# Patient Record
Sex: Female | Born: 1965 | Race: White | Hispanic: No | Marital: Married | Smoking: Never smoker
Health system: Southern US, Community
[De-identification: ages and names within clinical notes are randomized; demographics above are authoritative.]

## PROBLEM LIST (undated history)

## (undated) DIAGNOSIS — R011 Cardiac murmur, unspecified: Secondary | ICD-10-CM

## (undated) DIAGNOSIS — F329 Major depressive disorder, single episode, unspecified: Secondary | ICD-10-CM

## (undated) DIAGNOSIS — E119 Type 2 diabetes mellitus without complications: Secondary | ICD-10-CM

## (undated) DIAGNOSIS — N390 Urinary tract infection, site not specified: Secondary | ICD-10-CM

## (undated) DIAGNOSIS — H9313 Tinnitus, bilateral: Secondary | ICD-10-CM

## (undated) DIAGNOSIS — F32A Depression, unspecified: Secondary | ICD-10-CM

## (undated) DIAGNOSIS — R7303 Prediabetes: Secondary | ICD-10-CM

## (undated) DIAGNOSIS — N029 Recurrent and persistent hematuria with unspecified morphologic changes: Secondary | ICD-10-CM

## (undated) DIAGNOSIS — I10 Essential (primary) hypertension: Secondary | ICD-10-CM

## (undated) DIAGNOSIS — R519 Headache, unspecified: Secondary | ICD-10-CM

## (undated) DIAGNOSIS — Z8489 Family history of other specified conditions: Secondary | ICD-10-CM

## (undated) DIAGNOSIS — G4730 Sleep apnea, unspecified: Secondary | ICD-10-CM

## (undated) DIAGNOSIS — T7840XA Allergy, unspecified, initial encounter: Secondary | ICD-10-CM

## (undated) DIAGNOSIS — E669 Obesity, unspecified: Secondary | ICD-10-CM

## (undated) HISTORY — DX: Major depressive disorder, single episode, unspecified: F32.9

## (undated) HISTORY — DX: Allergy, unspecified, initial encounter: T78.40XA

## (undated) HISTORY — PX: CYSTECTOMY: SUR359

## (undated) HISTORY — DX: Obesity, unspecified: E66.9

## (undated) HISTORY — DX: Urinary tract infection, site not specified: N39.0

## (undated) HISTORY — DX: Type 2 diabetes mellitus without complications: E11.9

## (undated) HISTORY — DX: Depression, unspecified: F32.A

## (undated) HISTORY — DX: Cardiac murmur, unspecified: R01.1

## (undated) HISTORY — DX: Essential (primary) hypertension: I10

## (undated) HISTORY — DX: Recurrent and persistent hematuria with unspecified morphologic changes: N02.9

---

## 1999-08-15 ENCOUNTER — Other Ambulatory Visit: Admission: RE | Admit: 1999-08-15 | Discharge: 1999-08-15 | Payer: Self-pay | Admitting: Gynecology

## 1999-09-18 ENCOUNTER — Emergency Department (HOSPITAL_COMMUNITY): Admission: EM | Admit: 1999-09-18 | Discharge: 1999-09-18 | Payer: Self-pay | Admitting: Emergency Medicine

## 1999-10-02 ENCOUNTER — Encounter: Payer: Self-pay | Admitting: Family Medicine

## 1999-10-02 ENCOUNTER — Encounter: Admission: RE | Admit: 1999-10-02 | Discharge: 1999-10-02 | Payer: Self-pay | Admitting: Family Medicine

## 2000-09-03 ENCOUNTER — Other Ambulatory Visit: Admission: RE | Admit: 2000-09-03 | Discharge: 2000-09-03 | Payer: Self-pay | Admitting: Gynecology

## 2001-10-05 ENCOUNTER — Other Ambulatory Visit: Admission: RE | Admit: 2001-10-05 | Discharge: 2001-10-05 | Payer: Self-pay | Admitting: Gynecology

## 2002-01-31 ENCOUNTER — Encounter: Payer: Self-pay | Admitting: Emergency Medicine

## 2002-01-31 ENCOUNTER — Emergency Department (HOSPITAL_COMMUNITY): Admission: EM | Admit: 2002-01-31 | Discharge: 2002-01-31 | Payer: Self-pay | Admitting: Emergency Medicine

## 2004-03-20 ENCOUNTER — Other Ambulatory Visit: Admission: RE | Admit: 2004-03-20 | Discharge: 2004-03-20 | Payer: Self-pay | Admitting: Gynecology

## 2005-11-14 ENCOUNTER — Other Ambulatory Visit: Admission: RE | Admit: 2005-11-14 | Discharge: 2005-11-14 | Payer: Self-pay | Admitting: Gynecology

## 2006-05-19 ENCOUNTER — Emergency Department (HOSPITAL_COMMUNITY): Admission: EM | Admit: 2006-05-19 | Discharge: 2006-05-19 | Payer: Self-pay | Admitting: Emergency Medicine

## 2006-05-20 ENCOUNTER — Ambulatory Visit (HOSPITAL_COMMUNITY): Admission: RE | Admit: 2006-05-20 | Discharge: 2006-05-20 | Payer: Self-pay | Admitting: Ophthalmology

## 2006-08-25 ENCOUNTER — Encounter: Admission: RE | Admit: 2006-08-25 | Discharge: 2006-08-25 | Payer: Self-pay | Admitting: Obstetrics and Gynecology

## 2006-11-02 ENCOUNTER — Emergency Department (HOSPITAL_COMMUNITY): Admission: EM | Admit: 2006-11-02 | Discharge: 2006-11-02 | Payer: Self-pay | Admitting: Family Medicine

## 2006-11-13 ENCOUNTER — Other Ambulatory Visit: Admission: RE | Admit: 2006-11-13 | Discharge: 2006-11-13 | Payer: Self-pay | Admitting: Gynecology

## 2007-02-25 ENCOUNTER — Emergency Department (HOSPITAL_COMMUNITY): Admission: EM | Admit: 2007-02-25 | Discharge: 2007-02-25 | Payer: Self-pay | Admitting: Family Medicine

## 2007-07-01 ENCOUNTER — Emergency Department (HOSPITAL_COMMUNITY): Admission: EM | Admit: 2007-07-01 | Discharge: 2007-07-01 | Payer: Self-pay | Admitting: Emergency Medicine

## 2007-11-18 ENCOUNTER — Other Ambulatory Visit: Admission: RE | Admit: 2007-11-18 | Discharge: 2007-11-18 | Payer: Self-pay | Admitting: Gynecology

## 2008-02-03 ENCOUNTER — Emergency Department (HOSPITAL_COMMUNITY): Admission: EM | Admit: 2008-02-03 | Discharge: 2008-02-03 | Payer: Self-pay | Admitting: Emergency Medicine

## 2008-03-22 ENCOUNTER — Encounter: Payer: Self-pay | Admitting: Internal Medicine

## 2008-03-23 ENCOUNTER — Emergency Department (HOSPITAL_COMMUNITY): Admission: EM | Admit: 2008-03-23 | Discharge: 2008-03-23 | Payer: Self-pay | Admitting: Emergency Medicine

## 2008-04-20 ENCOUNTER — Emergency Department (HOSPITAL_COMMUNITY): Admission: EM | Admit: 2008-04-20 | Discharge: 2008-04-20 | Payer: Self-pay | Admitting: Emergency Medicine

## 2008-09-08 ENCOUNTER — Encounter: Payer: Self-pay | Admitting: Internal Medicine

## 2008-09-11 ENCOUNTER — Telehealth: Payer: Self-pay | Admitting: Internal Medicine

## 2008-09-11 ENCOUNTER — Ambulatory Visit: Payer: Self-pay | Admitting: Internal Medicine

## 2008-09-11 DIAGNOSIS — R1033 Periumbilical pain: Secondary | ICD-10-CM | POA: Insufficient documentation

## 2008-09-13 ENCOUNTER — Ambulatory Visit: Payer: Self-pay | Admitting: Cardiology

## 2008-09-14 ENCOUNTER — Telehealth: Payer: Self-pay | Admitting: Internal Medicine

## 2008-11-24 ENCOUNTER — Other Ambulatory Visit: Admission: RE | Admit: 2008-11-24 | Discharge: 2008-11-24 | Payer: Self-pay | Admitting: Gynecology

## 2008-11-24 ENCOUNTER — Encounter: Payer: Self-pay | Admitting: Women's Health

## 2008-11-24 ENCOUNTER — Ambulatory Visit: Payer: Self-pay | Admitting: Women's Health

## 2009-06-08 ENCOUNTER — Emergency Department (HOSPITAL_COMMUNITY): Admission: EM | Admit: 2009-06-08 | Discharge: 2009-06-08 | Payer: Self-pay | Admitting: Emergency Medicine

## 2009-07-27 ENCOUNTER — Emergency Department (HOSPITAL_COMMUNITY): Admission: EM | Admit: 2009-07-27 | Discharge: 2009-07-27 | Payer: Self-pay | Admitting: Emergency Medicine

## 2009-10-11 ENCOUNTER — Ambulatory Visit (HOSPITAL_BASED_OUTPATIENT_CLINIC_OR_DEPARTMENT_OTHER): Admission: RE | Admit: 2009-10-11 | Discharge: 2009-10-11 | Payer: Self-pay | Admitting: Surgery

## 2009-12-03 ENCOUNTER — Other Ambulatory Visit: Admission: RE | Admit: 2009-12-03 | Discharge: 2009-12-03 | Payer: Self-pay | Admitting: Gynecology

## 2009-12-03 ENCOUNTER — Ambulatory Visit: Payer: Self-pay | Admitting: Women's Health

## 2009-12-07 ENCOUNTER — Ambulatory Visit: Payer: Self-pay | Admitting: Women's Health

## 2009-12-21 ENCOUNTER — Ambulatory Visit: Payer: Self-pay | Admitting: Women's Health

## 2009-12-25 DIAGNOSIS — N029 Recurrent and persistent hematuria with unspecified morphologic changes: Secondary | ICD-10-CM

## 2009-12-25 HISTORY — DX: Recurrent and persistent hematuria with unspecified morphologic changes: N02.9

## 2010-01-25 ENCOUNTER — Ambulatory Visit: Payer: Self-pay | Admitting: Women's Health

## 2010-01-31 ENCOUNTER — Emergency Department (HOSPITAL_COMMUNITY): Admission: EM | Admit: 2010-01-31 | Discharge: 2010-01-31 | Payer: Self-pay | Admitting: Emergency Medicine

## 2010-03-27 ENCOUNTER — Encounter: Payer: Self-pay | Admitting: Internal Medicine

## 2010-03-27 ENCOUNTER — Emergency Department (HOSPITAL_COMMUNITY): Admission: EM | Admit: 2010-03-27 | Discharge: 2010-03-27 | Payer: Self-pay | Admitting: Emergency Medicine

## 2010-05-07 ENCOUNTER — Ambulatory Visit: Payer: Self-pay | Admitting: Internal Medicine

## 2010-05-07 ENCOUNTER — Encounter (INDEPENDENT_AMBULATORY_CARE_PROVIDER_SITE_OTHER): Payer: Self-pay | Admitting: *Deleted

## 2010-05-07 DIAGNOSIS — R1011 Right upper quadrant pain: Secondary | ICD-10-CM

## 2010-05-10 ENCOUNTER — Ambulatory Visit: Payer: Self-pay | Admitting: Internal Medicine

## 2010-05-13 ENCOUNTER — Telehealth: Payer: Self-pay | Admitting: Internal Medicine

## 2010-05-13 ENCOUNTER — Emergency Department (HOSPITAL_COMMUNITY): Admission: EM | Admit: 2010-05-13 | Discharge: 2010-05-13 | Payer: Self-pay | Admitting: Emergency Medicine

## 2010-05-13 ENCOUNTER — Ambulatory Visit: Payer: Self-pay | Admitting: Internal Medicine

## 2010-05-14 ENCOUNTER — Telehealth: Payer: Self-pay | Admitting: Internal Medicine

## 2010-05-17 ENCOUNTER — Telehealth: Payer: Self-pay | Admitting: Internal Medicine

## 2010-05-20 ENCOUNTER — Telehealth: Payer: Self-pay | Admitting: Internal Medicine

## 2010-05-20 ENCOUNTER — Emergency Department (HOSPITAL_BASED_OUTPATIENT_CLINIC_OR_DEPARTMENT_OTHER): Admission: EM | Admit: 2010-05-20 | Discharge: 2010-05-20 | Payer: Self-pay | Admitting: Emergency Medicine

## 2010-05-23 ENCOUNTER — Ambulatory Visit (HOSPITAL_COMMUNITY): Admission: RE | Admit: 2010-05-23 | Discharge: 2010-05-23 | Payer: Self-pay | Admitting: Internal Medicine

## 2010-05-23 ENCOUNTER — Telehealth: Payer: Self-pay | Admitting: Internal Medicine

## 2010-09-13 ENCOUNTER — Emergency Department (HOSPITAL_COMMUNITY): Admission: EM | Admit: 2010-09-13 | Discharge: 2010-09-13 | Payer: Self-pay | Admitting: Emergency Medicine

## 2010-09-16 ENCOUNTER — Telehealth: Payer: Self-pay | Admitting: Internal Medicine

## 2010-09-30 ENCOUNTER — Ambulatory Visit: Payer: Self-pay | Admitting: Internal Medicine

## 2010-10-27 HISTORY — PX: CHOLECYSTECTOMY: SHX55

## 2010-11-06 ENCOUNTER — Encounter: Payer: Self-pay | Admitting: Internal Medicine

## 2010-11-18 LAB — URINE MICROSCOPIC-ADD ON

## 2010-11-18 LAB — DIFFERENTIAL
Basophils Relative: 0 % (ref 0–1)
Eosinophils Absolute: 0.3 10*3/uL (ref 0.0–0.7)
Eosinophils Relative: 3 % (ref 0–5)
Monocytes Relative: 6 % (ref 3–12)
Neutro Abs: 4.8 10*3/uL (ref 1.7–7.7)
Neutrophils Relative %: 55 % (ref 43–77)

## 2010-11-18 LAB — COMPREHENSIVE METABOLIC PANEL
AST: 16 U/L (ref 0–37)
Albumin: 3.8 g/dL (ref 3.5–5.2)
CO2: 26 mEq/L (ref 19–32)
Chloride: 105 mEq/L (ref 96–112)
GFR calc Af Amer: >60 mL/min (ref >60)
GFR calc non Af Amer: >60 mL/min (ref >60)
Potassium: 3.9 mEq/L (ref 3.5–5.1)
Sodium: 140 mEq/L (ref 135–145)
Total Bilirubin: 0.6 mg/dL (ref 0.3–1.2)
Total Protein: 7.3 g/dL (ref 6.0–8.3)

## 2010-11-18 LAB — URINALYSIS, ROUTINE W REFLEX MICROSCOPIC
Bilirubin Urine: NEGATIVE
Ketones, ur: NEGATIVE mg/dL
Leukocytes, UA: NEGATIVE
Nitrite: NEGATIVE
Specific Gravity, Urine: 1.015 (ref 1.005–1.030)
pH: 6 (ref 5.0–8.0)

## 2010-11-18 LAB — CBC
Hemoglobin: 12.6 g/dL (ref 12.0–15.0)
RBC: 4.36 MIL/uL (ref 3.87–5.11)
WBC: 8.7 10*3/uL (ref 4.0–10.5)

## 2010-11-18 LAB — SURGICAL PCR SCREEN: Staphylococcus aureus: NEGATIVE

## 2010-11-21 ENCOUNTER — Ambulatory Visit (HOSPITAL_COMMUNITY)
Admission: RE | Admit: 2010-11-21 | Discharge: 2010-11-21 | Payer: Self-pay | Source: Home / Self Care | Attending: Surgery | Admitting: Surgery

## 2010-11-25 NOTE — Op Note (Signed)
Ann Walter, Ann Walter                 ACCOUNT NO.:  [id number]  MEDICAL RECORD NO.:  [id number]          PATIENT TYPE:  AMB  LOCATION:  DAY                          FACILITY:  [facility]  PHYSICIAN:  Velora Heckler, MD      DATE OF BIRTH:  [birth year]  DATE OF PROCEDURE:  11/21/2010                               OPERATIVE REPORT   PREOPERATIVE DIAGNOSES:  Biliary dyskinesia, abdominal pain.  POSTOPERATIVE DIAGNOSES:  Biliary dyskinesia, abdominal pain.  PROCEDURE:  Laparoscopic cholecystectomy with intraoperative cholangiography.  SURGEON:  Velora Heckler, MD, FACS  ANESTHESIA:  General per Dr. Lucille Passy.  ESTIMATED BLOOD LOSS:  Minimal.  PREPARATION:  ChloraPrep.  COMPLICATIONS:  None.  INDICATIONS:  The patient is a 45 year old white female from [place], [place].  She is referred by Dr. Stan Head for cholecystectomy for intermittent episodes of right upper quadrant abdominal pain associated with nausea and vomiting.  The patient has had an extensive workup.  Included in her workup was a nuclear medicine hepatobiliary scan which showed mild symptom reproduction with administration of cholecystokinin.  The patient now comes to surgery for cholecystectomy.  BODY OF REPORT:  Procedure was done in OR #6 at the [facility].  The patient was brought to the operating room, placed in supine position on the operating room table.  Following administration of general anesthesia, the patient is positioned and then prepped and draped in the usual strict aseptic fashion.  After ascertaining that and an adequate level of anesthesia being achieved, an incision was made in the midline just below the umbilicus.  Dissection was carried down to the fascia.  Fascia was incised in the midline and the peritoneal cavity is entered cautiously.  A 0 Vicryl pursestring suture was placed in the fascia.  Hasson cannula was introduced under direct vision and  secured with a pursestring suture.  Abdomen was insufflated with carbon dioxide.  Laparoscope was introduced and the abdomen explored.  Operative ports were placed along the right costal margin in the midline, midclavicular line, and anterior axillary line. Gallbladder was grasped and retracted cephalad.  There are a few omental adhesions to the undersurface of the gallbladder which were gently taken down with use of the electrocautery.  Dissection was begun at the neck of the gallbladder.  Cystic duct is dissected out along its length. Cystic artery is also dissected out, doubly clipped and divided.  Clip was placed at the neck of the gallbladder.  Cystic duct was incised. Clear yellow bile emanates from the cystic duct.  A Cook cholangiography catheter was inserted into the cystic duct and secured with a Ligaclip. Using C-arm fluoroscopy, real time cholangiography was performed.  There is rapid filling of a relatively prominent biliary tree.  However, there was free flow distally into the duodenum without filling defect or obstruction.  There was also reflux of contrast into the right and left hepatic ductal systems without obstruction.  Clip was withdrawn and Cook catheter was removed from the peritoneal cavity.  Cystic duct was triply clipped and divided.  Gallbladder was then further mobilized.  Posterior branch of  the cystic artery was divided between Ligaclips.  Gallbladder was then excised from the gallbladder bed using the hook electrocautery for hemostasis. Gallbladder was completely excised.  It was placed into an EndoCatch bag and withdrawn through the umbilical port.  It is submitted to pathology for review.  Right upper quadrant was copiously irrigated with warm saline.  Good hemostasis was noted.  Fluid was evacuated.  Ports were removed under direct vision and good hemostasis was noted at all port sites. Pneumoperitoneum was released; 0 Vicryl pursestring sutures  tied securely at the umbilicus.  Port sites were anesthetized with local anesthetic.  Skin incisions were closed with interrupted 4-0 Monocryl subcuticular sutures.  Wounds washed and dried and benzoin and Steri- Strips were applied.  Sterile dressings were applied.  The patient was awakened from anesthesia and brought to the recovery room.  The patient tolerated the procedure well.   Velora Heckler, MD, FACS     TMG/MEDQ  D:  11/21/2010  T:  11/21/2010  Job:  [id number]  cc:   Iva Boop, MD,FACG [facility] [address] [place], [place] [address]  Davonna Belling. Maple Hudson, N.P.  Electronically Signed by Darnell Level MD on 11/25/2010 10:20:47 AM

## 2010-11-28 NOTE — Progress Notes (Signed)
Summary: sx appt  Phone Note Call from Patient Call back at Home Phone [phone]   Caller: Patient Call For: Dr. Leone Payor Reason for Call: Talk to Nurse Summary of Call: wants to know if surgery appt can be moved up Initial call taken by: Vallarie Mare,  May 20, 2010 2:41 PM  Follow-up for Phone Call        patient was seen again in the ER this am for vomiting.  They sent her home diagnosed with recurrent UTI.  She is advised she can be on a brat diet, she has been on clear liquid diet only since she saw Dr Leone Payor last week.  Patient  is scheduled for HIDA this Thursday.  She is asked to keep that appointment.  Follow-up by: Darcey Nora RN, CGRN,  May 20, 2010 3:19 PM

## 2010-11-28 NOTE — Progress Notes (Signed)
Summary: referred to ED  Phone Note Other Incoming   Summary of Call: received info ftrom Dr. Elnoria Howard that she was experiencing throat and chest pain problems and was directed to go to ED please follow-up on that Iva Boop MD, [facility]  May 13, 2010 8:11 AM   Follow-up for Phone Call        Left message for patient to call back I have copied and pasted ER note into her chart.  Patient  was just d/c at 6:30 am. Darcey Nora RN, [facility]  May 13, 2010 8:26 AM  Patient went home this am, she still has chest pain and hurts to swallow.  She was given pain meds and sent home.  patient will come in this pm and see Dr Leone Payor at 2:30.  Patient says she doesn't have her co-pay, I have advised her we will bill her later for this and to keep the appointment this afternoon. Follow-up by: Darcey Nora RN, CGRN,  May 13, 2010 9:50 AM

## 2010-11-28 NOTE — Progress Notes (Signed)
Summary: Abd Pain  Phone Note Call from Patient Call back at Home Phone [phone]   Call For: Dr Leone Payor Reason for Call: Talk to Nurse Summary of Call: Was in hospital again with severe abd pain again and wonders if there is any other test that can be done. She is so tired of hurting all the time. Initial call taken by: Leanor Kail [facility],  September 16, 2010 8:48 AM  Follow-up for Phone Call        Patient was seen in the ER on Saturday for abdominal pain.  She went to the ER for severe RUQ pain on Sat, with nausea and vomitng.  Today no pain, RUQ tenderness some minimal nausea. She was given zofran and ultram and this has helped with the pain.  She denies fever, vomiting today.  Dr Leone Payor she wants to know if there is any additional testing to do.  She was advised to take ultram for pain, zofran for nausea and remain on a bland diet.  Dr Leone Payor please advise. Follow-up by: Darcey Nora RN, CGRN,  September 16, 2010 10:25 AM  Additional Follow-up for Phone Call Additional follow up Details #1::        I will look over things but she will need to schedule a follow-up visit to get best care. i cannot give her an answer now - will review things and see if any other testing needs to be done and call back by next week i do think she should schedule an REV though Additional Follow-up by: Iva Boop MD, [facility],  September 16, 2010 12:23 PM    Additional Follow-up for Phone Call Additional follow up Details #2::    Patient  aware Dr Leone Payor will review and we will call back.  REV sceduled for 09/30/10 10:30 Follow-up by: Darcey Nora RN, CGRN,  September 16, 2010 1:56 PM

## 2010-11-28 NOTE — Progress Notes (Signed)
Summary: results  Phone Note Call from Patient Call back at Home Phone [phone]   Caller: Patient Call For: Dr Leone Payor Reason for Call: Talk to Nurse Summary of Call: Patient would like Hida Scan results from today. Initial call taken by: Tawni Levy,  May 23, 2010 2:11 PM  Follow-up for Phone Call        Dr Leone Payor looks normal to me, anything else I should let her know? Darcey Nora RN, [facility]  May 23, 2010 2:15 PM  Additional Follow-up for Phone Call Additional follow up Details #1::        No is she still having problems? Additional Follow-up by: Iva Boop MD, [facility],  May 23, 2010 2:53 PM    Additional Follow-up for Phone Call Additional follow up Details #2::    Patient aware of the results.     No pain, but has vomiting x 1 today.  This is the first episode of vomiting since Monday.  She is still on a bland diet.  Follow-up by: Darcey Nora RN, CGRN,  May 23, 2010 2:56 PM  Additional Follow-up for Phone Call Additional follow up Details #3:: Details for Additional Follow-up Action Taken: she should take Prilosec OTC 20 mg every AM x 1 month f/u me 6 weeks sooner as needed  Additional Follow-up by: Iva Boop MD, [facility],  May 23, 2010 3:00 PM  patient aware, REV scheduled for 07/15/10 4:00 Darcey Nora RN, [facility]  May 23, 2010 3:20 PM

## 2010-11-28 NOTE — Assessment & Plan Note (Signed)
Summary: abd pain//nausea--ch.   History of Present Illness Visit Type: Follow-up Visit Primary GI MD: Stan Head MD [facility] Primary Provider: Abbe Amsterdam, MD Requesting Provider: na Chief Complaint: ER follow up  History of Present Illness:   Patient went to the ER June 1 for abdominal pain, she states that she has had several attacks since leaving the ER. She complains of some nausea and her ultrasound showed a dilated bile duct. She is under alot of stress with her brother in ICU.   Still having epigastric pain off and on since 2009. Epigastric and RUQ pain, no clear trigger. > 10x/month. May awaken. Since 2009, has avoided "bad stuf" and tried acid reducer in PM and it reduces nausea.     GI Review of Systems    Reports nausea.      Denies abdominal pain, acid reflux, belching, bloating, chest pain, dysphagia with liquids, dysphagia with solids, heartburn, loss of appetite, vomiting, vomiting blood, weight loss, and  weight gain.        Denies anal fissure, black tarry stools, change in bowel habit, constipation, diarrhea, diverticulosis, fecal incontinence, heme positive stool, hemorrhoids, irritable bowel syndrome, jaundice, light color stool, liver problems, rectal bleeding, and  rectal pain.    [place] of Abdomen  Procedure date:  03/27/2010  Findings:         Borderline dilated common duct, suggesting the possibility of a non-   visualized distal common duct stone.  Correlation with liver   function tests is recommended.  Otherwise, normal examination.   Current Medications (verified): 1)  Xanax 0.5 Mg Tabs (Alprazolam) .... As Needed 2)  Hydrochlorothiazide 25 Mg Tabs (Hydrochlorothiazide) .... Take One By Mouth Once Daily 3)  Junel 1/20 1-20 Mg-Mcg Tabs (Norethindrone Acet-Ethinyl Est) .... Take One By Mouth Once Daily 4)  Ra Acid Reducer 75 Mg Tabs (Ranitidine Hcl) .... Take One By Mouth As Needed  Allergies (verified): 1)  Sulfa  Past History:  Past  Medical History: Reviewed history from 09/11/2008 and no changes required. Hypertension Obesity Urinary Tract Infection  Past Surgical History: cardiac cath 2 cyst removed from chest  Family History: Family History of Diabetes: Mother Family History of Heart Disease: Mother Family History of Kidney Disease:Father & Brother, maternal grandmother Family History of Breast Cancer: maternal grandmothers sister  Social History: Reviewed history from 09/11/2008 and no changes required. 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  Alcohol Use - no Daily Caffeine Use-3 Illicit Drug Use - no Patient does not get regular exercise.  Married, no children  Vital Signs:  Patient profile:   45 year old female Height:      63 inches Weight:      232.0 pounds BMI:     41.25 Pulse rate:   80 / minute Pulse rhythm:   regular BP sitting:   150 / 82  (left arm) Cuff size:   regular  Vitals Entered By: Harlow Mares CMA Duncan Dull) (May 07, 2010 10:12 AM)  Physical Exam  General:  Well developed, well nourished, no acute distress.obese.   Eyes:   no icterus. Lungs:  Clear throughout to auscultation. Heart:  Regular rate and rhythm; no murmurs, rubs,  or bruits. Abdomen:  mildly tender epigastrium otherwise soft, nt, not distended, no masses or herniae bowel sounds + Psych:  Alert and cooperative. Normal mood and affect.   03/27/10 labs  Step Type: LAB Procedure Name: CBC WITH DIFF Procedure: CBC WITH DIFF Result: WBC COUNT 9.9 K/uL [  4.0-10.5] RBC COUNT 4.23 MIL/uL [3.87-5.11] HEMOGLOBIN 12.6 g/dL [16.1-09.6] HEMATOCRIT 36.0 % [36.0-46.0] MCV 84.9 fL [78.0-100.0] MCHC 34.9 g/dL [04.5-40.9] RDW 81.1 % [11.5-15.5] PLATELET COUNT 260 K/uL [150-400] NEUTROPHIL 74 % [43-77] ABS GRANULOCYTE 7.3 K/uL [1.7-7.7] LYMPHOCYTE 18 % [12-46] ABS LYMPH 1.8 K/uL [0.7-4.0] MONOCYTE 6 % [3-12] ABS MONOCYTE 0.6 K/uL [0.1-1.0] EOSINOPHIL 1 % [0-5] ABS EOS  0.1 K/uL [0.0-0.7] BASOPHIL 0 % [0-1] ABS BASO 0.0 K/uL [0.0-0.1] 20:18 03/27/2010 by Cherrie Distance - PA, Ms. Reviewed result: Result Type: Cleda Daub: [id number] Step Type: LAB Procedure Name: CARDIACMARKERS, POC Procedure: CARDIAC MARKERS, POC Procedure Notes: COMMENT - OF 0.00-0.09 ng/mL SHOW NO INDICATION OF MYOCARDIAL INJURY. PERSISTENTLY INCREASED TROPONIN VALUES IN THE RANGE OF 0.10-0.24 ng/mL CAN BE SEEN IN: -UNSTABLE ANGINA -CONGESTIVE HEART FAILURE -MYOCARDITIS -CHEST TRAUMA -ARRYHTHMIAS -LATE PRESENTING MI -COPD CLINICAL FOLLOW-UP RECOMMENDED. TROPONIN VALUES >=0.25 ng/mL INDICATE POSSIBLE MYOCARDIAL ISCHEMIA. SERIAL TESTING RECOMMENDED. Result: MYOGLOBIN, POC 78.1 ng/mL [12-200] CKMB, POC <1.0 ng/mL [1.0-8.0] L TROPONIN I, POC <0.05 ng/mL [0.00-0.09] COMMENT TROPONIN VALUES IN THE RANGE 20:57 03/27/2010 by Cherrie Distance - PA, Ms. Reviewed result: Result Type: Cleda Daub: [id number] Step Type: LAB Procedure Name: COMPREHENSIVE METABOLIC PANEL Procedure: COMPREHENSIVE METABOLIC PANEL Procedure Notes: GFR, Est Afr Am - The eGFR has been calculated using the MDRD equation. This calculation has not been validated in all clinical situations. eGFR's persistently <60 mL/min signify possible Chronic Kidney Disease. Result: SODIUM 135 mEq/L [135-145] POTASSIUM 3.1 mEq/L [3.5-5.1] L CHLORIDE 104 mEq/L [96-112] CARBON DIOXIDE 25 mEq/L [19-32] GLUCOSE 99 mg/dL [78-46] BUN 10 mg/dL [9-62] CREATININE 9.52 mg/dL [8.4-1.3] CALCIUM 9.3 mg/dL [2.4-40.1] TOTAL PROTEIN 7.1 g/dL [0.2-7.2] ALBUMIN 3.7 g/dL [5.3-6.6] AST/SGOT 17 U/L [0-37] ALT/SGPT 14 U/L [0-35] ALKALINE PHOSPHATASE 49 U/L [39-117] BILIRUBIN, TOTAL 0.7 mg/dL [4.4-0.3] GFR, Est Non Af Am >60 mL/min [>60] GFR, Est Afr Am >60 mL/min [>60] 20:57 03/27/2010 by Cherrie Distance - PA, Ms. Reviewed result: Result Type: Cleda Daub: [id number] Step Type: LAB Procedure Name: LIPASE Procedure:  LIPASE Result: LIPASE 20 U/L [11-59] 20:57 03/27/2010 by Cherrie Distance - PA, Ms.  Impression & Recommendations:  Problem # 1:  ABDOMINAL PAIN, UPPER (ICD-789.09) Assessment Deteriorated ? why - cause not clear has had some ascites on scans in past and ? of angioedema was raised by me (was on ACEI) but not proven has had hepatomegaly though due to fatty liver also has not had an EGD so will investigate with that next to look for upper GI pathology that may cause pain probably will be negative buyt need to look  her CBD is minimally dilated and I doubt CBD stone based upon all abailable info but will keep in mind  Risks, benefits,and indications of endoscopic procedure(s) were reviewed with the patient and all questions answered.   Orders: EGD (EGD)  Problem # 2:  ASCITES (ICD-789.59) Assessment: Improved in past on CT, consider repeat imaging pending EGD  Problem # 3:  NONSPECIFIC ABN FINDNG RAD&OTH EXAM BILARY TRCT (ICD-793.3) Assessment: New minimally dilated CBD at 6.3 cm....await EGd before following this up  Patient Instructions: 1)  We will see you at your procedure on 05/10/10. 2)  [facility] Patient Information Guide given to patient.  3)  Upper Endoscopy brochure given.  4)  Copy sent to : Maryelizabeth Rowan, NP, Abbe Amsterdam, MD 5)  The medication list was reviewed and reconciled.  All changed / newly prescribed medications were explained.  A complete medication list was provided to the patient / caregiver.

## 2010-11-28 NOTE — Assessment & Plan Note (Signed)
Summary: post ER visit/continued pain in chest/sheri   History of Present Illness Visit Type: Follow-up Visit Primary GI MD: Stan Head MD [facility] Primary Provider: Abbe Amsterdam, MD Requesting Provider: na Chief Complaint: Nausea  History of Present Illness:   45 yo woman that had an EGD 3 days ago - gastric biopsies taken. Investigating recurrent abdominal pain. Some pain after EGD and mild sore throat. Sat mild pain then increased on Sunday with nausea and vomting. to ED - see report - reviewed CT chest/abd/pelvis unrevelaing + UA Rx with promethazine and Percocet.  No urinary sxs (no bladder sxs)    GI Review of Systems    Reports nausea and  vomiting blood.      Denies abdominal pain, acid reflux, belching, bloating, chest pain, dysphagia with liquids, dysphagia with solids, heartburn, loss of appetite, vomiting, weight loss, and  weight gain.        Denies anal fissure, black tarry stools, change in bowel habit, constipation, diarrhea, diverticulosis, fecal incontinence, heme positive stool, hemorrhoids, irritable bowel syndrome, jaundice, light color stool, liver problems, rectal bleeding, and  rectal pain.    Current Medications (verified): 1)  Xanax 0.5 Mg Tabs (Alprazolam) .... As Needed 2)  Hydrochlorothiazide 25 Mg Tabs (Hydrochlorothiazide) .... Take One By Mouth Once Daily 3)  Junel 1/20 1-20 Mg-Mcg Tabs (Norethindrone Acet-Ethinyl Est) .... Take One By Mouth Once Daily 4)  Ra Acid Reducer 75 Mg Tabs (Ranitidine Hcl) .... Take One By Mouth As Needed 5)  Percocet 5-325 Mg Tabs (Oxycodone-Acetaminophen) .... One or Two Tablets By Mouth Every Six Hours As Needed For Pain (Did Not Rx Filled Yet But Plans To) 6)  Zofran 8 Mg Tabs (Ondansetron Hcl) .... One Tablet By Mouth Every Four To Six Hours As Needed For Nausea( Did Not Rx Filled Yet But Plans To)  Allergies (verified): 1)  Sulfa  Past History:  Past Medical History: Reviewed history from 09/11/2008 and no  changes required. Hypertension Obesity Urinary Tract Infection  Past Surgical History: Reviewed history from 05/07/2010 and no changes required. cardiac cath 2 cyst removed from chest  Family History: Family History of Diabetes: Mother Family History of Heart Disease: Mother Family History of Kidney Disease:Father & Brother, maternal grandmother Family History of Breast Cancer: maternal grandmothers sister No FH of Colon Cancer:  Social History: Reviewed history from 09/11/2008 and no changes required. 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  Alcohol Use - no Daily Caffeine Use-3 Illicit Drug Use - no Patient does not get regular exercise.  Married, no children  Review of Systems       exacerbation of chronic headaches right now  Vital Signs:  Patient profile:   45 year old female Height:      63 inches Weight:      232 pounds BMI:     41 .25 BSA:     2.06 Pulse rate:   74 / minute Pulse rhythm:   regular BP sitting:   136 / 80  (right arm) Cuff size:   regular  Vitals Entered By: Ok Anis CMA (May 13, 2010 2:34 PM)  Physical Exam  General:  Well developed, well nourished, no acute distress.obese.   Lungs:  Clear throughout to auscultation. Heart:  Regular rate and rhythm; no murmurs, rubs,  or bruits. Abdomen:  tender - moderate RUQ soft BS+ no mass +/- worse with mscle tension ribs not tender Msk:  mild R CVAT    Step Type: LAB Procedure Name:  CBC WITH DIFF Procedure: CBC WITH DIFF Result: WBC COUNT 14.1 K/uL [4.0-10.5] H RBC COUNT 4.16 MIL/uL [3.87-5.11] HEMOGLOBIN 12.2 g/dL [11.9-14.7] HEMATOCRIT 36.1 % [36.0-46.0] MCV 86.8 fL [78.0-100.0] MCH 29.3 pg [26.0-34.0] MCHC 33.7 g/dL [82.9-56.2] RDW 13.0 % [11.5-15.5] PLATELET COUNT 235 K/uL [150-400] NEUTROPHIL 84 % [43-77] H ABS GRANULOCYTE 11.7 K/uL [1.7-7.7] H LYMPHOCYTE 11 % [12-46] L ABS LYMPH 1.5 K/uL [0.7-4.0] MONOCYTE 4 % [3-12] ABS  MONOCYTE 0.6 K/uL [0.1-1.0] EOSINOPHIL 2 % [0-5] ABS EOS 0.2 K/uL [0.0-0.7] BASOPHIL 0 % [0-1] ABS BASO 0.0 K/uL [0.0-0.1] 02:25 05/13/2010 by Margarita Grizzle - MD, Dr. Reviewed result: Result Type: Cleda Daub: [id number] Step Type: LAB Procedure Name: COMPREHENSIVE METABOLIC PANEL Procedure: COMPREHENSIVE METABOLIC PANEL Procedure Notes: GFR, Est Afr Am - The eGFR has been calculated using the MDRD equation. This calculation has not been validated in all clinical situations. eGFR's persistently <60 mL/min signify possible Chronic Kidney Disease. Result: 3 Cloris, Flippo MRN: [id number] Acct#: [id number] SODIUM 138 mEq/L [135-145] POTASSIUM 3.1 mEq/L [3.5-5.1] L CHLORIDE 107 mEq/L [96-112] CARBON DIOXIDE 25 mEq/L [19-32] GLUCOSE 118 mg/dL [24-40] H BUN 11 mg/dL [1-02] CREATININE 7.25 mg/dL [3.6-6.4] CALCIUM 9.0 mg/dL [4.0-34.7] TOTAL PROTEIN 6.9 g/dL [4.2-5.9] ALBUMIN 3.6 g/dL [5.6-3.8] AST/SGOT 14 U/L [0-37] ALT/SGPT 12 U/L [0-35] ALKALINE PHOSPHATASE 69 U/L [39-117] BILIRUBIN, TOTAL 0.4 mg/dL [7.5-6.4] GFR, Est Non Af Am >60 mL/min [>60] GFR, Est Afr Am >60 mL/min [>60] 03:20 05/13/2010 by Margarita Grizzle - MD, Dr. Reviewed result: Result Type: Cleda Daub: [id number] Step Type: LAB Procedure Name: URINE PREGNANCY Procedure: URINE PREGNANCY Procedure Notes: URINE PREGNANCY - THE SENSITIVITY OF THIS METHODOLOGY IS >24 mIU/mL Result: URINE PREGNANCY NEGATIVE 04:34 05/13/2010 by Margarita Grizzle - MD, Dr. Reviewed result: Result Type: Cleda Daub: [id number] Step Type: LAB Procedure Name: URINE MACROSCOPIC Procedure: URINE MACROSCOPIC Result: URINE COLOR YELLOW [YELLOW] URINE APPEARANCE CLOUDY [CLEAR] A URINE SPEC GRAVITY 1.019 [1.005-1.030] URINE PH 5.5 [5.0-8.0] URINE GLUCOSE NEGATIVE mg/dL [NEG] URINE HEMOGLOBIN MODERATE [NEG] A URINE BILIRUBIN NEGATIVE [NEG] URINE KETONES 15 mg/dL [NEG] A URINE TOTAL PROTEIN NEGATIVE mg/dL [NEG] 4 Starr, Urias MRN: [id number] Acct#: [id number] URINE UROBILINOGEN 0.2 mg/dL [5.5-7.3] URINE NITRITE NEGATIVE [NEG] LEUKOCYTE ESTERASE SMALL [NEG] A 05:27 05/13/2010 by Margarita Grizzle - MD, Dr. Reviewed result: Result Type: Cleda Daub: [id number] Step Type: LAB Procedure Name: URINE MICROSCOPIC Procedure: URINE MICROSCOPIC Result: URINE EPITHELIAL MANY [RARE] A URINE WBC'S 7-10 WBC/hpf [<3] URINE RBC'S 3-6 RBC/hpf [<3] BACTERIA MANY [RARE] A CAST HYALINE CASTS [NEG] A  Impression & Recommendations:  Problem # 1:  ABDOMINAL PAIN, UPPER (ICD-789.09) Assessment Deteriorated RUQ now....not inconsistent with prior spells - I do not think it is related to the EGD CT chest, abd, pelvis with contrast ok ? atypical GB so will check HIDA this may be a severe functional bowel syndrome Orders: HIDA Scan (HIDA SCAN)  Problem # 2:  NONSPECIFIC ABN FINDNG RAD&OTH EXAM BILARY TRCT (ICD-793.3) CBD ok on CT yesterday  Problem # 3:  NAUSEA AND VOMITING (ICD-787.01) Assessment: Deteriorated etiology not clear part of spells of pain in past ? acute infection - UTI?, cholecystits (doubt). May have gastroenteritis? Orders: HIDA Scan (HIDA SCAN)  Problem # 4:  URINALYSIS, ABNORMAL (ICD-791.9) Assessment: New cover with cipro for possible UTI given problems  Patient Instructions: 1)  Clear liquid diet - frequent sips to keep liquids in 2)  Please pick up your medications at your pharmacy. Cipro for possible urinary tract infection. 3)  Call our office tomorrow  with an update. 4)  Your HIDA scan is scheduled for 05/23/10. 5)  The medication list was reviewed and reconciled.  All changed / newly prescribed medications were explained.  A complete medication list was provided to the patient / caregiver. Prescriptions: CIPRO 500 MG TABS (CIPROFLOXACIN HCL) 1 by mouth two times a day  #14 x 0   Entered by:   Francee Piccolo CMA (AAMA)   Authorized by:   Iva Boop MD, [facility]   Signed by:    Francee Piccolo CMA (AAMA) on 05/13/2010   Method used:   Electronically to        [id number] [address] (retail)       [address]       [place], [place]  [id number]       Ph: [phone]       Fax: [phone]   RxID:   [id number] CIPRO 500 MG TABS (CIPROFLOXACIN HCL) 1 by mouth two times a day  #14 x 0   Entered and Authorized by:   Iva Boop MD, [facility]   Signed by:   Iva Boop MD, FACG on 05/13/2010   Method used:   Electronically to        [facility] [address] [address] (retail)       [address]       [place], [place]  [address]       Ph: [phone]       Fax: [phone]   RxID:   [id number]  Pt requests rx to be sent to [facility].  RX cancelled with [facility] @ [facility]. Francee Piccolo CMA Duncan Dull)  May 13, 2010 3:53 PM

## 2010-11-28 NOTE — Procedures (Signed)
Summary: Upper Endoscopy  Patient: Ann Walter Note: All result statuses are Final unless otherwise noted.  Tests: (1) Upper Endoscopy (EGD)   EGD Upper Endoscopy       DONE     [facility]     [address]     [place], [place]  [address]           ENDOSCOPY PROCEDURE REPORT           PATIENT:  Ann, Walter  MR#:  [id number]     BIRTHDATE:  11/11/1965, 43 yrs. old  GENDER:  female           ENDOSCOPIST:  Iva Boop, MD, [facility]           PROCEDURE DATE:  05/10/2010     PROCEDURE:  EGD with biopsy     ASA CLASS:  Class II     INDICATIONS:  abdominal pain, multiple sites           MEDICATIONS:   Benadryl 25 mg IV, Versed 10 mg IV, Fentanyl 100     mcg IV     TOPICAL ANESTHETIC:  Exactacain Spray           DESCRIPTION OF PROCEDURE:   After the risks benefits and     alternatives of the procedure were thoroughly explained, informed     consent was obtained.  The LB [id number] endoscope was     introduced through the mouth and advanced to the second portion of     the duodenum, without limitations.  The instrument was slowly     withdrawn as the mucosa was fully examined.     <<PROCEDUREIMAGES>>           The upper, middle, and distal third of the esophagus were     carefully inspected and no abnormalities were noted. The z-line     was well seen at the GEJ. The endoscope was pushed into the fundus     which was normal including a retroflexed view. The antrum,gastric     body, first and second part of the duodenum were unremarkable.     z-lne at 40 cm Biopsies of the antrum and body of the stomach were     obtained and sent to pathology.    Retroflexed views revealed no     abnormalities.    The scope was then withdrawn from the patient     and the procedure completed.           COMPLICATIONS:  None           ENDOSCOPIC IMPRESSION:     1) Normal EGD - biopsies taken to rule out occult gastritis     RECOMMENDATIONS:     1) Await biopsy results     may  need to work-up minimally dilated CBD further vs.     ant-spasmodic Tx depending upon biopsy results           REPEAT EXAM:  In for as needed.           DEEP SEDATION WITH PROPOFOL FOR FUTURE EXAMS - there was minimal     effect of sedation           Iva Boop, MD, [facility]           CC:  Abbe Amsterdam, MD     The Patient           n.     eSIGNED:  Iva Boop at 05/10/2010 03:41 PM           Robbi Garter, [id number]  Note: An exclamation mark (!) indicates a result that was not dispersed into the flowsheet. Document Creation Date: 05/10/2010 3:42 PM _______________________________________________________________________  (1) Order result status: Final Collection or observation date-time: 05/10/2010 15:28 Requested date-time:  Receipt date-time:  Reported date-time:  Referring Physician:   Ordering Physician: Stan Head [phone]) Specimen Source:  Source: Launa Grill Order Number: [id number] Lab site:

## 2010-11-28 NOTE — Letter (Signed)
Summary: EGD Instructions  [facility]  [address] [place], [place] [address]   Phone: [phone]  Fax: [phone]       Ann Walter    12-09-1965    MRN: [id number]       Procedure Day Dorna Bloom: Farrell Ours, 05/10/10     Arrival Time:  2:00 PM     Procedure Time: 3:00 PM     Location of Procedure:                    _X_ [facility] (4th Floor)  PREPARATION FOR ENDOSCOPY   On FRIDAY, 05/10/10, THE DAY OF THE PROCEDURE:  1.   No solid foods, milk or milk products are allowed after midnight the night before your procedure.  2.   Do not drink anything colored red or purple.  Avoid juices with pulp.  No orange juice.  3.  You may drink clear liquids until 1:00 PM, which is 2 hours before your procedure.                                                                                                CLEAR LIQUIDS INCLUDE: Water Jello Ice Popsicles Tea (sugar ok, no milk/cream) Powdered fruit flavored drinks Coffee (sugar ok, no milk/cream) Gatorade Juice: apple, white grape, white cranberry  Lemonade Clear bullion, consomm, broth Carbonated beverages (any kind) Strained chicken noodle soup Hard Candy   MEDICATION INSTRUCTIONS  Unless otherwise instructed, you should take regular prescription medications with a small sip of water as early as possible the morning of your procedure.                  OTHER INSTRUCTIONS  You will need a responsible adult at least 46 years of age to accompany you and drive you home.   This person must remain in the waiting room during your procedure.  Wear loose fitting clothing that is easily removed.  Leave jewelry and other valuables at home.  However, you may wish to bring a book to read or an iPod/MP3 player to listen to music as you wait for your procedure to start.  Remove all body piercing jewelry and leave at home.  Total time from sign-in until discharge is approximately 2-3 hours.  You  should go home directly after your procedure and rest.  You can resume normal activities the day after your procedure.  The day of your procedure you should not:   Drive   Make legal decisions   Operate machinery   Drink alcohol   Return to work  You will receive specific instructions about eating, activities and medications before you leave.    The above instructions have been reviewed and explained to me by   Francee Piccolo, CMA (AAMA)     I fully understand and can verbalize these instructions _____________________________ Date _________

## 2010-11-28 NOTE — Assessment & Plan Note (Signed)
Summary: abdominal pain/Ann Walter   History of Present Illness Visit Type: Follow-up Visit Primary GI MD: Stan Head MD [facility] Primary Provider: Abbe Amsterdam, MD Requesting Provider: na Chief Complaint: RUQ pain History of Present Illness:   Patient complains of RUQ pain this morning she woke up dizzy and lightheaded. The abdominal pain is chronic no change since last time she was here. She complains of nausea and vomited this morning. She went to the ER about 2 weeks ago.  Labs negative. She received IV analgesia with success. Took Ultram for while without much relief. Pains have generally been episodic and self limited. Occur after meals but an ocur without eating. Is not havng pain this AM, was dizzy, nauseous and vomited. Less stress lately and not using Xanax and Zofran.           EGD  Procedure date:  05/10/2010  Findings:      NORMAL MUCOSA  BX:  FOCAL GASTRITIS   Current Medications (verified): 1)  Xanax 0.5 Mg Tabs (Alprazolam) .... As Needed 2)  Lisinopril 20 Mg Tabs (Lisinopril) .... Take One By Mouth Once Daily 3)  Junel 1/20 1-20 Mg-Mcg Tabs (Norethindrone Acet-Ethinyl Est) .... Take One By Mouth Once Daily 4)  Ra Acid Reducer 75 Mg Tabs (Ranitidine Hcl) .... Take One By Mouth As Needed 5)  Percocet 5-325 Mg Tabs (Oxycodone-Acetaminophen) .... One or Two Tablets By Mouth Every Six Hours As Needed For Pain (Out X 2 Weeks.) 6)  Zofran 8 Mg Tabs (Ondansetron Hcl) .... One Tablet By Mouth Every Four To Six Hours As Needed For Nausea(Out)  Allergies (verified): 1)  Sulfa  Past History:  Past Medical History: Reviewed history from 09/11/2008 and no changes required. Hypertension Obesity Urinary Tract Infection  Past Surgical History: Reviewed history from 05/07/2010 and no changes required. cardiac cath 2 cyst removed from chest  Family History: Reviewed history from 05/13/2010 and no changes required. Family History of Diabetes: Mother Family History  of Heart Disease: Mother Family History of Kidney Disease:Father & Brother, maternal grandmother Family History of Breast Cancer: maternal grandmothers sister No FH of Colon Cancer:  Social History: Reviewed history from 09/11/2008 and no changes required. 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  Alcohol Use - no Daily Caffeine Use-3 Illicit Drug Use - no Patient does not get regular exercise.  Married, no children  Review of Systems       headaches with menstrual periods but no migraines  Vital Signs:  Patient profile:   45 year old female Height:      63 inches Weight:      229.6 pounds BMI:     40.82 Pulse rate:   80 / minute Pulse rhythm:   regular BP sitting:   140 / 88  (left arm) Cuff size:   regular  Vitals Entered By: Harlow Mares CMA Duncan Dull) (September 30, 2010 10:32 AM)  Physical Exam  General:  Well developed, well nourished, no acute distress.obese.   Eyes:   no icterus. Heart:  Regular rate and rhythm; no murmurs, rubs,  or bruits. Abdomen:  tender - mild RUQ soft BS+ no mass ribs not tender Psych:  Alert and cooperative. Normal mood and affect.   Impression & Recommendations:  Problem # 1:  RUQ PAIN (ICD-789.01) Assessment Deteriorated  Chronic and intermittent problem without objective abnormalities on imaging ad endoscopy. she had some sxs with CCK injection and a strong ejection fraction. ? if this is a biliary/gallbladder problem  despite this and will ask for surgical opinion. She understands why and that cholecystectomy may not be recommended. will refill analgesic and antiemetic and also try antispasmodic.  Orders: [facility] ([facility])  Patient Instructions: 1)  Please pick up your medications at your pharmacy. Zofran 2)  You are scheduled to see Dr. Gerrit Friends at [facility] on November 06, 2009.  You should arrive at 9:45am.  If you have any questions or need to  reschedule, you can call their office at [phone]. 3)  Copy sent to : Ann Amsterdam, MD; Ann Level, MD 4)  The medication list was reviewed and reconciled.  All changed / newly prescribed medications were explained.  A complete medication list was provided to the patient / caregiver. Prescriptions: ZOFRAN 8 MG TABS (ONDANSETRON HCL) one tablet by mouth every four to six hours as needed for nausea(out)  #20 x 1   Entered and Authorized by:   Iva Boop MD, [facility]   Signed by:   Iva Boop MD, FACG on 09/30/2010   Method used:   Electronically to        [facility] [address] [address] (retail)       [address]       [place], [place]  [address]       Ph: [phone]       Fax: [phone]   RxID:   [id number] PERCOCET 5-325 MG TABS (OXYCODONE-ACETAMINOPHEN) one or two tablets by mouth every six hours as needed for pain (OUT x 2 weeks.)  #20 x 0   Entered and Authorized by:   Iva Boop MD, [facility]   Signed by:   Iva Boop MD, FACG on 09/30/2010   Method used:   Print then Give to Patient   RxID:   [id number] HYOSCYAMINE SULFATE 0.125 MG  SUBL (HYOSCYAMINE SULFATE) 1-2 sublingual as needed for abdominal pain every 4 hours  #60 x 1   Entered and Authorized by:   Iva Boop MD, [facility]   Signed by:   Iva Boop MD, [facility] on 09/30/2010   Method used:   Electronically to        [facility] [address] [address] (retail)       [address]       [place], [place]  [address]       Ph: [phone]       Fax: [phone]   RxID:   [id number]

## 2010-11-28 NOTE — Progress Notes (Signed)
Summary: Biopsy results  Phone Note Call from Patient Call back at Home Phone [phone]   Caller: Patient Call For: Dr. Leone Payor Reason for Call: Lab or Test Results Summary of Call: Biopsy results from her Endo Initial call taken by: Karna Christmas,  May 14, 2010 2:01 PM  Follow-up for Phone Call        Patient  wanted Dr Leone Payor to know she is better.  I did advise her bx results not yet available. Follow-up by: Darcey Nora RN, CGRN,  May 14, 2010 2:26 PM

## 2010-11-28 NOTE — Progress Notes (Signed)
Summary: diet ?'s  Phone Note Call from Patient Call back at Home Phone [phone]   Caller: Patient Call For: Dr. Leone Payor Reason for Call: Talk to Nurse Summary of Call: pt says she is on a liquid only diet and wants to know if she can have fruit Initial call taken by: Vallarie Mare,  May 17, 2010 4:04 PM  Follow-up for Phone Call        Pt. called asking if fruits  could be added to diet.Advised her to do liquids as ordered over the week end and call back on Monday and discuss further with Dr.Hansini Clodfelter's nurse as pt. is not clear  on why she should remain on liquid diet until Thursday. Follow-up by: Teryl Lucy RN,  May 17, 2010 4:32 PM

## 2010-12-10 ENCOUNTER — Encounter: Payer: Self-pay | Admitting: Internal Medicine

## 2010-12-12 NOTE — Consult Note (Signed)
Summary: Consultation Report  Consultation Report   Imported By: Sherian Rein 12/02/2010 10:29:46  _____________________________________________________________________  External Attachment:    Type:   Image     Comment:   External Document

## 2010-12-16 ENCOUNTER — Other Ambulatory Visit: Payer: Self-pay | Admitting: Women's Health

## 2010-12-16 ENCOUNTER — Encounter: Payer: Self-pay | Admitting: Women's Health

## 2010-12-16 ENCOUNTER — Encounter (INDEPENDENT_AMBULATORY_CARE_PROVIDER_SITE_OTHER): Payer: Managed Care, Other (non HMO) | Admitting: Women's Health

## 2010-12-16 ENCOUNTER — Other Ambulatory Visit (HOSPITAL_COMMUNITY)
Admission: RE | Admit: 2010-12-16 | Discharge: 2010-12-16 | Disposition: A | Discharge status: Home / Self Care | Payer: Managed Care, Other (non HMO) | Source: Ambulatory Visit | Attending: Gynecology | Admitting: Gynecology

## 2010-12-16 DIAGNOSIS — Z01419 Encounter for gynecological examination (general) (routine) without abnormal findings: Secondary | ICD-10-CM

## 2010-12-16 DIAGNOSIS — R319 Hematuria, unspecified: Secondary | ICD-10-CM

## 2010-12-16 DIAGNOSIS — Z124 Encounter for screening for malignant neoplasm of cervix: Secondary | ICD-10-CM | POA: Insufficient documentation

## 2010-12-16 DIAGNOSIS — R823 Hemoglobinuria: Secondary | ICD-10-CM

## 2011-01-02 NOTE — Letter (Signed)
Summary: [facility]   Imported By: Lennie Odor 12/23/2010 11:10:52  _____________________________________________________________________  External Attachment:    Type:   Image     Comment:   External Document

## 2011-01-07 LAB — URINALYSIS, ROUTINE W REFLEX MICROSCOPIC
Glucose, UA: NEGATIVE mg/dL
Leukocytes, UA: NEGATIVE
Protein, ur: NEGATIVE mg/dL
Specific Gravity, Urine: 1.007 (ref 1.005–1.030)
pH: 6.5 (ref 5.0–8.0)

## 2011-01-07 LAB — DIFFERENTIAL
Basophils Absolute: 0 10*3/uL (ref 0.0–0.1)
Basophils Relative: 0 % (ref 0–1)
Eosinophils Absolute: 0.1 10*3/uL (ref 0.0–0.7)
Eosinophils Relative: 1 % (ref 0–5)
Neutrophils Relative %: 79 % — ABNORMAL HIGH (ref 43–77)

## 2011-01-07 LAB — URINE MICROSCOPIC-ADD ON

## 2011-01-07 LAB — CBC
HCT: 33.9 % — ABNORMAL LOW (ref 36.0–46.0)
MCHC: 33.6 g/dL (ref 30.0–36.0)
Platelets: 262 10*3/uL (ref 150–400)
RDW: 14.2 % (ref 11.5–15.5)
WBC: 13.2 10*3/uL — ABNORMAL HIGH (ref 4.0–10.5)

## 2011-01-07 LAB — COMPREHENSIVE METABOLIC PANEL
ALT: 12 U/L (ref 0–35)
AST: 14 U/L (ref 0–37)
CO2: 21 mEq/L (ref 19–32)
Chloride: 109 mEq/L (ref 96–112)
GFR calc Af Amer: >60 mL/min (ref >60)
GFR calc non Af Amer: >60 mL/min (ref >60)
Glucose, Bld: 99 mg/dL (ref 70–99)
Sodium: 138 mEq/L (ref 135–145)
Total Bilirubin: 0.5 mg/dL (ref 0.3–1.2)

## 2011-01-07 LAB — LIPASE, BLOOD: Lipase: 23 U/L (ref 11–59)

## 2011-01-07 LAB — PREGNANCY, URINE: Preg Test, Ur: NEGATIVE

## 2011-01-11 LAB — COMPREHENSIVE METABOLIC PANEL
ALT: 10 U/L (ref 0–35)
Albumin: 3.6 g/dL (ref 3.5–5.2)
Alkaline Phosphatase: 89 U/L (ref 39–117)
BUN: 11 mg/dL (ref 6–23)
CO2: 25 mEq/L (ref 19–32)
CO2: 22 mEq/L (ref 19–32)
Chloride: 107 mEq/L (ref 96–112)
Creatinine, Ser: 0.89 mg/dL (ref 0.4–1.2)
GFR calc non Af Amer: >60 mL/min (ref >60)
GFR calc non Af Amer: 54 mL/min — ABNORMAL LOW (ref >60)
Glucose, Bld: 110 mg/dL — ABNORMAL HIGH (ref 70–99)
Potassium: 3.1 mEq/L — ABNORMAL LOW (ref 3.5–5.1)
Sodium: 143 mEq/L (ref 135–145)
Total Bilirubin: 0.4 mg/dL (ref 0.3–1.2)

## 2011-01-11 LAB — CBC
HCT: 39.9 % (ref 36.0–46.0)
Hemoglobin: 13.1 g/dL (ref 12.0–15.0)
MCH: 29.3 pg (ref 26.0–34.0)
MCV: 86.8 fL (ref 78.0–100.0)
Platelets: 235 10*3/uL (ref 150–400)
RBC: 4.16 MIL/uL (ref 3.87–5.11)
RBC: 4.59 MIL/uL (ref 3.87–5.11)
WBC: 8.3 10*3/uL (ref 4.0–10.5)

## 2011-01-11 LAB — DIFFERENTIAL
Basophils Absolute: 0 10*3/uL (ref 0.0–0.1)
Basophils Relative: 0 % (ref 0–1)
Eosinophils Absolute: 0.2 10*3/uL (ref 0.0–0.7)
Lymphocytes Relative: 11 % — ABNORMAL LOW (ref 12–46)
Monocytes Relative: 4 % (ref 3–12)
Neutro Abs: 11.7 10*3/uL — ABNORMAL HIGH (ref 1.7–7.7)
Neutrophils Relative %: 74 % (ref 43–77)

## 2011-01-11 LAB — URINE MICROSCOPIC-ADD ON

## 2011-01-11 LAB — LIPASE, BLOOD: Lipase: 31 U/L (ref 23–300)

## 2011-01-11 LAB — URINALYSIS, ROUTINE W REFLEX MICROSCOPIC
Glucose, UA: NEGATIVE mg/dL
Protein, ur: NEGATIVE mg/dL
Specific Gravity, Urine: 1.019 (ref 1.005–1.030)
pH: 5.5 (ref 5.0–8.0)

## 2011-01-13 LAB — URINALYSIS, ROUTINE W REFLEX MICROSCOPIC
Nitrite: NEGATIVE
Protein, ur: NEGATIVE mg/dL
Specific Gravity, Urine: 1.01 (ref 1.005–1.030)
Urobilinogen, UA: 0.2 mg/dL (ref 0.0–1.0)

## 2011-01-13 LAB — CBC
MCV: 84.9 fL (ref 78.0–100.0)
Platelets: 260 10*3/uL (ref 150–400)
RDW: 15.4 % (ref 11.5–15.5)
WBC: 9.9 10*3/uL (ref 4.0–10.5)

## 2011-01-13 LAB — COMPREHENSIVE METABOLIC PANEL
AST: 17 U/L (ref 0–37)
Albumin: 3.7 g/dL (ref 3.5–5.2)
Calcium: 9.3 mg/dL (ref 8.4–10.5)
Chloride: 104 mEq/L (ref 96–112)
Creatinine, Ser: 0.99 mg/dL (ref 0.4–1.2)
GFR calc Af Amer: >60 mL/min (ref >60)
Total Bilirubin: 0.7 mg/dL (ref 0.3–1.2)
Total Protein: 7.1 g/dL (ref 6.0–8.3)

## 2011-01-13 LAB — POCT CARDIAC MARKERS
CKMB, poc: <1 ng/mL — ABNORMAL LOW (ref 1.0–8.0)
Myoglobin, poc: 78.1 ng/mL (ref 12–200)
Troponin i, poc: <0.05 ng/mL (ref 0.00–0.09)

## 2011-01-13 LAB — DIFFERENTIAL
Eosinophils Relative: 1 % (ref 0–5)
Lymphocytes Relative: 18 % (ref 12–46)
Lymphs Abs: 1.8 10*3/uL (ref 0.7–4.0)
Monocytes Absolute: 0.6 10*3/uL (ref 0.1–1.0)
Monocytes Relative: 6 % (ref 3–12)

## 2011-01-13 LAB — URINE MICROSCOPIC-ADD ON

## 2011-01-27 LAB — POCT HEMOGLOBIN-HEMACUE: Hemoglobin: 13 g/dL (ref 12.0–15.0)

## 2011-01-28 LAB — BASIC METABOLIC PANEL
BUN: 12 mg/dL (ref 6–23)
CO2: 25 mEq/L (ref 19–32)
Chloride: 106 mEq/L (ref 96–112)
Creatinine, Ser: 0.83 mg/dL (ref 0.4–1.2)
Potassium: 3.9 mEq/L (ref 3.5–5.1)

## 2011-05-05 ENCOUNTER — Ambulatory Visit (INDEPENDENT_AMBULATORY_CARE_PROVIDER_SITE_OTHER)
Admission: RE | Admit: 2011-05-05 | Discharge: 2011-05-05 | Disposition: A | Discharge status: Home / Self Care | Payer: Managed Care, Other (non HMO) | Source: Ambulatory Visit | Attending: Nurse Practitioner | Admitting: Nurse Practitioner

## 2011-05-05 ENCOUNTER — Ambulatory Visit (INDEPENDENT_AMBULATORY_CARE_PROVIDER_SITE_OTHER): Payer: Managed Care, Other (non HMO) | Admitting: Nurse Practitioner

## 2011-05-05 ENCOUNTER — Telehealth: Payer: Self-pay | Admitting: Internal Medicine

## 2011-05-05 ENCOUNTER — Other Ambulatory Visit (INDEPENDENT_AMBULATORY_CARE_PROVIDER_SITE_OTHER): Payer: Managed Care, Other (non HMO)

## 2011-05-05 ENCOUNTER — Encounter: Payer: Self-pay | Admitting: Nurse Practitioner

## 2011-05-05 VITALS — BP 140/90 | HR 80 | Temp 98.6°F | Ht 63.5 in | Wt 217.0 lb

## 2011-05-05 DIAGNOSIS — R112 Nausea with vomiting, unspecified: Secondary | ICD-10-CM

## 2011-05-05 DIAGNOSIS — R1033 Periumbilical pain: Secondary | ICD-10-CM

## 2011-05-05 LAB — CBC WITH DIFFERENTIAL/PLATELET
Basophils Relative: 0.3 % (ref 0.0–3.0)
Eosinophils Relative: 1.3 % (ref 0.0–5.0)
HCT: 39 % (ref 36.0–46.0)
Hemoglobin: 13.4 g/dL (ref 12.0–15.0)
Lymphs Abs: 2.7 10*3/uL (ref 0.7–4.0)
MCV: 89.9 fl (ref 78.0–100.0)
Monocytes Absolute: 0.5 10*3/uL (ref 0.1–1.0)
Monocytes Relative: 4.4 % (ref 3.0–12.0)
RBC: 4.33 Mil/uL (ref 3.87–5.11)
WBC: 11.8 10*3/uL — ABNORMAL HIGH (ref 4.5–10.5)

## 2011-05-05 LAB — COMPREHENSIVE METABOLIC PANEL
Alkaline Phosphatase: 58 U/L (ref 39–117)
BUN: 11 mg/dL (ref 6–23)
Creatinine, Ser: 0.9 mg/dL (ref 0.4–1.2)
Glucose, Bld: 101 mg/dL — ABNORMAL HIGH (ref 70–99)
Sodium: 139 mEq/L (ref 135–145)
Total Bilirubin: 0.5 mg/dL (ref 0.3–1.2)

## 2011-05-05 MED ORDER — HYDROCODONE-ACETAMINOPHEN 5-325 MG PO TABS: ORAL_TABLET | ORAL | Status: DC

## 2011-05-05 NOTE — Telephone Encounter (Signed)
Patient c/o abdominal pain.  RLQ she is also having nausea and vomiting.  She denies fever, but has had chills.  She had her gallbladder out in January and has intermittent pain like this, but this is the worst episode.  She reports she has RLQ tenderness.  She will come in and see Willette Cluster RNP at 1:30 today

## 2011-05-05 NOTE — Patient Instructions (Addendum)
We have scheduled you for labs and an xray.  You will go to our basement level today. We are faxing a prescription for pain medication Hydrocodone to [facility] [address]. We will call you with the results of the labs and the X-Ray. We made you a follow up appt with Dr. Stan Head on 05-15-2011. Appointment card given.

## 2011-05-05 NOTE — Progress Notes (Signed)
Ann Walter [id number] [birth year]   HISTORY OR PRESENT ILLNESS : Ann Walter is a 45 year old female evaluated several months ago by Dr. Leone Payor for right upper quadrant pain. She ultimately underwent a laparoscopic cholecystectomy for biliary dyskinesia by Dr. Ricky Stabs January 2012. Final pathology consistent with chronic cholecystitis. Patient called this morning with complaints of nausea, vomiting and right lower quadrant pain. This is her fourth or fifth episode since her cholecystectomy. Each episode lasts about a day and a half and consists of constant right lower quadrant pain associated with bloating, gas, nausea and vomiting. This current episode began around lunchtime yesterday.  Overall her bowel movements are normal during these episodes though today she did have a loose bowel movement. Of note,in between these episodes patient feels fine. No unusual vaginal discharge. No urinary symptoms. Not passing much gas but as previously mentioned, had some loose stool this am. Levsin was beneficial with previous episodes but it hasn't helped this time,  Current Medications, Allergies, Past Medical History, Past Surgical History, Family History and Social History were reviewed in [employer] record.   PHYSICAL EXAMINATION : General: Pleasant, obese white female who is intermittently crying  Head: Normocephalic and atraumatic Eyes:  sclerae anicteric,conjunctive pink. Ears: Normal auditory acuity Mouth: No deformity or lesions Neck: Supple, no masses.  Lungs: Clear throughout to auscultation Heart: Regular rate and rhythm; no murmurs heard Abdomen: Soft, nondistended, hypoactive bowel sounds. Diffusely tenderness, most pronounced just over umbilicus. No masses or hepatomegaly noted. N Rectal: Not done Musculoskeletal: Symmetrical with no gross deformities  Skin: No lesions on visible extremities Extremities: No edema or deformities noted Neurological: Alert  oriented x 4, grossly nonfocal Cervical Nodes:  No significant cervical adenopathy Psychological:  Alert and cooperative.   ASSESSMENT AND PLAN :

## 2011-05-05 NOTE — Assessment & Plan Note (Signed)
4-5 episodes of periumbilical pain since cholecystectomy in January of this year. Pain associated with nausea vomiting and bloating. Patient has diffuse abdominal tenderness on exam but most prominent in the periumbilical area. She complains of bloating but there is no significant abdominal distention on exam. Bowel sounds are hypoactive however. Obstructive process seems unlikely but she could have a low-grade obstruction. We'll obtain abdominal films. Also obtain basic lab studies. Until things can be sorted out the patient will be given hydrocodone to use when necessary. I will call her with test results and make further recommendations pending above. Patient will followup with Dr. Leone Payor in approximately 2 weeks' time.

## 2011-05-06 ENCOUNTER — Telehealth: Payer: Self-pay | Admitting: Internal Medicine

## 2011-05-06 NOTE — Telephone Encounter (Signed)
Message copied by Daphine Deutscher on Tue May 06, 2011  2:53 PM ------      Message from: Meredith Pel      Created: Tue May 06, 2011  1:22 PM       Rene Kocher, please call patient and let her know labs okay except WBC mildly elevated. How does she feel.  Does she still have the periumbilical pain? Did she have anymore diarrhea?  If having diarrhea lets get stool studies. Please let me know if still having pain. Thanks

## 2011-05-06 NOTE — Telephone Encounter (Signed)
Spoke with patient and she is feeling better since taking the pain pills. States she still feels bloated and has pain once in awhile. No diarrhea. Spoke with Willette Cluster, NP and patient is to call us if she still has pain after finishes pain pills. Patient aware.

## 2011-05-06 NOTE — Progress Notes (Signed)
I AGREE WITHNEVALUATION AND PLANS...DRP

## 2011-05-06 NOTE — Assessment & Plan Note (Addendum)
Fourth episode of periumbilical pain, nausea and vomiting since cholecystectomy in January 2012. Patient is diffusely tender, most pronounced over the umbilicus. No significant right lower quadrant tenderness. No abdominal distention on examination though a low-grade obstruction is possible. Patient does still have her appendix though appendicitis seems unlikely given her intermittent symptoms. Obtain laboratory studies, flat and upright of the abdomen. Patient will be called with test results and further recommendations depending on those results and her clinical course. She may need a CT scan of the abdomen and pelvis.

## 2011-05-15 ENCOUNTER — Ambulatory Visit: Payer: Managed Care, Other (non HMO) | Admitting: Internal Medicine

## 2011-07-23 LAB — DIFFERENTIAL
Basophils Absolute: 0
Eosinophils Absolute: 0.1
Eosinophils Relative: 1
Lymphs Abs: 2.2
Neutrophils Relative %: 68

## 2011-07-23 LAB — CBC
HCT: 37.4
MCHC: 34.2
MCV: 88.5
Platelets: 300

## 2011-07-23 LAB — COMPREHENSIVE METABOLIC PANEL
AST: 16
Albumin: 3.8
BUN: 9
CO2: 24
Calcium: 9.3
Creatinine, Ser: 0.92
GFR calc Af Amer: >60
GFR calc non Af Amer: >60

## 2011-07-23 LAB — LIPASE, BLOOD: Lipase: 23

## 2011-07-24 LAB — URINALYSIS, ROUTINE W REFLEX MICROSCOPIC
Glucose, UA: NEGATIVE
Ketones, ur: >80 — AB
Nitrite: NEGATIVE
Protein, ur: 30 — AB
Specific Gravity, Urine: 1.034 — ABNORMAL HIGH
Urobilinogen, UA: 0.2
pH: 5.5

## 2011-07-24 LAB — DIFFERENTIAL
Basophils Absolute: 0
Basophils Relative: 0
Eosinophils Absolute: 0.1
Eosinophils Relative: 1
Lymphocytes Relative: 12
Lymphs Abs: 1.4
Monocytes Absolute: 0.3
Monocytes Relative: 3
Neutro Abs: 10.3 — ABNORMAL HIGH
Neutrophils Relative %: 85 — ABNORMAL HIGH

## 2011-07-24 LAB — CBC
HCT: 43.1
Hemoglobin: 14.6
MCHC: 33.8
MCV: 88.7
Platelets: 325
RBC: 4.86
RDW: 14.3
WBC: 12.2 — ABNORMAL HIGH

## 2011-07-24 LAB — COMPREHENSIVE METABOLIC PANEL
ALT: 13
AST: 17
Albumin: 4
Alkaline Phosphatase: 51
BUN: 13
Chloride: 106
GFR calc Af Amer: >60
Potassium: 3.9
Sodium: 141
Total Bilirubin: 1.3 — ABNORMAL HIGH
Total Protein: 7.1

## 2011-07-24 LAB — PREGNANCY, URINE: Preg Test, Ur: NEGATIVE

## 2011-07-24 LAB — COMPREHENSIVE METABOLIC PANEL WITH GFR
CO2: 22
Calcium: 9.7
Creatinine, Ser: 1.06
GFR calc non Af Amer: 57 — ABNORMAL LOW
Glucose, Bld: 127 — ABNORMAL HIGH

## 2011-07-24 LAB — URINE MICROSCOPIC-ADD ON

## 2011-07-24 LAB — LIPASE, BLOOD: Lipase: 20

## 2011-08-27 ENCOUNTER — Telehealth: Payer: Self-pay | Admitting: *Deleted

## 2011-08-27 NOTE — Telephone Encounter (Signed)
Pt called c/o menopause s/s pt informed to make office visit.

## 2011-09-10 DIAGNOSIS — I10 Essential (primary) hypertension: Secondary | ICD-10-CM | POA: Insufficient documentation

## 2011-09-10 DIAGNOSIS — F32A Depression, unspecified: Secondary | ICD-10-CM | POA: Insufficient documentation

## 2011-09-10 DIAGNOSIS — F329 Major depressive disorder, single episode, unspecified: Secondary | ICD-10-CM | POA: Insufficient documentation

## 2011-09-15 ENCOUNTER — Ambulatory Visit (INDEPENDENT_AMBULATORY_CARE_PROVIDER_SITE_OTHER): Payer: Managed Care, Other (non HMO) | Admitting: Women's Health

## 2011-09-15 ENCOUNTER — Encounter: Payer: Self-pay | Admitting: Women's Health

## 2011-09-15 VITALS — BP 130/70

## 2011-09-15 DIAGNOSIS — N951 Menopausal and female climacteric states: Secondary | ICD-10-CM

## 2011-09-15 MED ORDER — FLUOXETINE HCL 20 MG PO CAPS: 20.0000 mg | ORAL_CAPSULE | Freq: Every day | ORAL | Status: DC

## 2011-09-15 NOTE — Progress Notes (Signed)
Patient ID: Ann Walter, female   DOB: 1965/11/16, 45 y.o.   MRN: [id number] Presents with a complaint of hot flashes, poor sleep, increased moodiness, and more short tempered than normal for her. Has had some problems with anxiety and depression in the past has used occasional Xanax. Has used Zoloft in the past but stopped. Denies any feelings of harming self or others. Has had counseling in the past and declines need for at this time. Contracepting on Loestrin 1/20/ light 2-3 day cycle most months, will occasionally skip. States work is okay, raising 20 year old stepdaughter full time which is difficult.  Menopausal symptoms/situational stressors/anxiety depression  Plan: TSH, FSH. Medication options were reviewed. Would like to try daily medication. Will try Prozac 20 by mouth daily, increase leisure activities, encouraged counseling to deal with 27 year old stepdaughter. Will evaluate at annual exam or sooner if no relief. Menopause reviewed.

## 2011-12-02 ENCOUNTER — Ambulatory Visit (INDEPENDENT_AMBULATORY_CARE_PROVIDER_SITE_OTHER): Payer: Managed Care, Other (non HMO) | Admitting: Family Medicine

## 2011-12-02 VITALS — BP 158/95 | HR 74 | Temp 98.8°F | Resp 16 | Ht 63.5 in | Wt 220.0 lb

## 2011-12-02 DIAGNOSIS — F411 Generalized anxiety disorder: Secondary | ICD-10-CM

## 2011-12-02 DIAGNOSIS — I10 Essential (primary) hypertension: Secondary | ICD-10-CM

## 2011-12-02 DIAGNOSIS — F419 Anxiety disorder, unspecified: Secondary | ICD-10-CM

## 2011-12-02 DIAGNOSIS — K219 Gastro-esophageal reflux disease without esophagitis: Secondary | ICD-10-CM

## 2011-12-02 DIAGNOSIS — K5289 Other specified noninfective gastroenteritis and colitis: Secondary | ICD-10-CM

## 2011-12-02 MED ORDER — LISINOPRIL 20 MG PO TABS: 40.0000 mg | ORAL_TABLET | Freq: Every day | ORAL | Status: DC

## 2011-12-02 MED ORDER — GI COCKTAIL ~~LOC~~
30.0000 mL | Freq: Once | ORAL | Status: AC
  Administered 2011-12-02: 30 mL via ORAL

## 2011-12-02 MED ORDER — ALPRAZOLAM 0.5 MG PO TABS: 0.5000 mg | ORAL_TABLET | ORAL | Status: DC | PRN

## 2011-12-02 NOTE — Progress Notes (Signed)
  Patient Name: Ann Walter Date of Birth: [birth year] Medical Record Number: [id number] Gender: female Date of Encounter: 12/02/2011  History of Present Illness:  Ann Walter is a 46 y.o. very pleasant female patient who presents with the following:  Needs to do her workplace health form, and also medication refills.  Also ate some cole slaw over the last several days which caused some heart burn, acid reflux and nausea. Has not been very good about taking her zantac. Would like a GI cocktail today.    Patient Active Problem List  Diagnoses  . RUQ PAIN  . ABDOMINAL PAIN, PERIUMBILICAL  . URINALYSIS, ABNORMAL  . Periumbilical pain  . Nausea & vomiting  . Depression  . HTN (hypertension)  . Benign hematuria   Past Medical History  Diagnosis Date  . Obesity   . UTI (urinary tract infection), uncomplicated   . Depression   . HTN (hypertension)   . Benign hematuria 12/2009    WORKUP WITH DR. NESI WAS NEGATIVE  . Heart murmur    Past Surgical History  Procedure Date  . Cholecystectomy 10/2010  . Cystectomy     x 2 from chest   History  Substance Use Topics  . Smoking status: Never Smoker   . Smokeless tobacco: Never Used  . Alcohol Use: No   Family History  Problem Relation Age of Onset  . Diabetes Mother   . Heart disease Mother     HAD TRIPLE BYPASS  . Hypertension Mother   . Kidney disease Father   . Breast cancer Sister   . Breast cancer Maternal Grandmother   . Colon cancer Neg Hx    Allergies  Allergen Reactions  . Sulfonamide Derivatives     Medication list has been reviewed and updated.  Review of Systems: As per HPI.  Otherwise negative  Physical Examination: Filed Vitals:   12/02/11 1538  BP: 158/95  Pulse: 74  Temp: 98.8 F (37.1 C)  TempSrc: Oral  Resp: 16  Height: 5' 3.5" (1.613 m)  Weight: 220 lb (99.791 kg)    Body mass index is 38.36 kg/(m^2).  GEN: WDWN, NAD, Non-toxic, A & O x 3, obese HEENT: Atraumatic,  Normocephalic. Neck supple. No masses, No LAD. Ears and Nose: No external deformity. CV: RRR, No M/G/R. No JVD. No thrill. No extra heart sounds. PULM: CTA B, no wheezes, crackles, rhonchi. No retractions. No resp. distress. No accessory muscle use. ABD: S, NT, ND, +BS. No rebound. No HSM. EXTR: No c/c/e NEURO Normal gait.  PSYCH: Normally interactive. Conversant. Not depressed or anxious appearing.  Calm demeanor.   GI cocktail- helped with GERD sx and she felt better Assessment and Plan: Hypertension- will increase lisinopril to 40mg  daily. Recheck BP 140/90 but still too high on average. Take #2 of her 20mg  tablets to start.  If tolerared well can write for 40mg  next time.  Will see her OBG on 2/23 and will have a BP recheck then.  Anxiety: refilled xanax.  Uses 3 or 4 times weekly as needed for anxiety.  Gerd: discussed with patient.  She will try and do better with her diet and take her zantac more regularly.    She is aware that she needs to lost weight and plans to work on this.

## 2011-12-19 ENCOUNTER — Encounter: Payer: Managed Care, Other (non HMO) | Admitting: Women's Health

## 2011-12-24 ENCOUNTER — Other Ambulatory Visit: Payer: Self-pay | Admitting: *Deleted

## 2011-12-24 MED ORDER — NORETHIN ACE-ETH ESTRAD-FE 1-20 MG-MCG PO TABS: 1.0000 | ORAL_TABLET | Freq: Every day | ORAL | Status: DC

## 2012-01-07 ENCOUNTER — Ambulatory Visit (INDEPENDENT_AMBULATORY_CARE_PROVIDER_SITE_OTHER): Payer: Managed Care, Other (non HMO) | Admitting: Women's Health

## 2012-01-07 ENCOUNTER — Encounter: Payer: Self-pay | Admitting: Women's Health

## 2012-01-07 ENCOUNTER — Other Ambulatory Visit (HOSPITAL_COMMUNITY)
Admission: RE | Admit: 2012-01-07 | Discharge: 2012-01-07 | Disposition: A | Discharge status: Home / Self Care | Payer: Managed Care, Other (non HMO) | Source: Ambulatory Visit | Attending: Obstetrics and Gynecology | Admitting: Obstetrics and Gynecology

## 2012-01-07 VITALS — BP 130/88 | Ht 64.5 in | Wt 222.0 lb

## 2012-01-07 DIAGNOSIS — IMO0001 Reserved for inherently not codable concepts without codable children: Secondary | ICD-10-CM

## 2012-01-07 DIAGNOSIS — Z833 Family history of diabetes mellitus: Secondary | ICD-10-CM

## 2012-01-07 DIAGNOSIS — N951 Menopausal and female climacteric states: Secondary | ICD-10-CM

## 2012-01-07 DIAGNOSIS — Z309 Encounter for contraceptive management, unspecified: Secondary | ICD-10-CM

## 2012-01-07 DIAGNOSIS — Z01419 Encounter for gynecological examination (general) (routine) without abnormal findings: Secondary | ICD-10-CM | POA: Insufficient documentation

## 2012-01-07 DIAGNOSIS — Z13220 Encounter for screening for lipoid disorders: Secondary | ICD-10-CM

## 2012-01-07 MED ORDER — NORETHIN ACE-ETH ESTRAD-FE 1-20 MG-MCG PO TABS
1.0000 | ORAL_TABLET | Freq: Every day | ORAL | Status: DC
Start: 2012-01-07 — End: 2012-01-08

## 2012-01-07 NOTE — Progress Notes (Signed)
Ann Walter [facility] February 22, 1966 [id number]    History:    The patient presents for annual exam.  Monthly light 2 day cycle on Junel with complaints of hot flushes and  frequent urination with no pain or burning. History of benign hematuria. History of hypertension on lisinopril/depression on wellbutrin- primary care manages. History of ASCUS with negative HR HPV 2011- Pap in 2012 normal. Mammogram overdue.    Past medical history, past surgical history, family history and social history were all reviewed and documented in the EPIC chart. Stepdaughter [place], [place], full custody doing well.   ROS:  A  ROS was performed and pertinent positives and negatives are included in the history.  Exam:  Filed Vitals:   01/07/12 1509  BP: 130/88    General appearance:  Normal Head/Neck:  Normal, without cervical or supraclavicular adenopathy. Thyroid:  Symmetrical, normal in size, without palpable masses or nodularity. Respiratory  Effort:  Normal  Auscultation:  Clear without wheezing or rhonchi Cardiovascular  Auscultation:  Regular rate, without rubs, murmurs or gallops  Edema/varicosities:  Not grossly evident Abdominal  Soft,nontender, without masses, guarding or rebound.  Liver/spleen:  No organomegaly noted  Hernia:  None appreciated  Skin  Inspection:  Grossly normal  Palpation:  Grossly normal Neurologic/psychiatric  Orientation:  Normal with appropriate conversation.  Mood/affect:  Normal  Genitourinary    Breasts: Examined lying and sitting.     Right: Without masses, retractions, discharge or axillary adenopathy.     Left: Without masses, retractions, discharge or axillary adenopathy.   Inguinal/mons:  Normal without inguinal adenopathy  External genitalia:  Normal  BUS/Urethra/Skene's glands:  Normal  Bladder:  Normal  Vagina:  Normal  Cervix:  Normal  Uterus:   normal in size, shape and contour.  Midline and mobile  Adnexa/parametria:     Rt: Without masses or  tenderness.   Lt: Without masses or tenderness.  Anus and perineum: Normal  Digital rectal exam: Normal sphincter tone without palpated masses or tenderness  Assessment/Plan:  46 y.o. MWF G1 P0 for annual exam.   Urinary frequency with history of benign hematuria Ascus negative HR HPV 2011 Hypertension and depression-primary care medications.  Plan: Requested FSH to be repeated due to her cycles being light spotting and having hot flushes on Junel. Junel prescription,  proper use, risk for blood clots and strokes reviewed. Has been hypertensive for years stable on medication, blood pressure slightly up today and again reviewed hazards of OC's in relationship to hypertension. If FSH elevated, will stop at end of pack, FSH -24  08/2011. SBE's, annual mammogram which is overdue, instructed to schedule. Encouraged increased exercise, decrease calories for weight loss. CBC, glucose, lipid profile, UA and Pap.    [facility], 5:23 PM 01/07/2012

## 2012-01-07 NOTE — Patient Instructions (Signed)

## 2012-01-08 ENCOUNTER — Other Ambulatory Visit: Payer: Self-pay | Admitting: Women's Health

## 2012-01-08 ENCOUNTER — Other Ambulatory Visit: Payer: Self-pay | Admitting: Obstetrics and Gynecology

## 2012-01-08 DIAGNOSIS — IMO0001 Reserved for inherently not codable concepts without codable children: Secondary | ICD-10-CM

## 2012-01-08 DIAGNOSIS — Z1231 Encounter for screening mammogram for malignant neoplasm of breast: Secondary | ICD-10-CM

## 2012-01-08 LAB — CBC WITH DIFFERENTIAL/PLATELET
Basophils Absolute: 0 10*3/uL (ref 0.0–0.1)
Basophils Relative: 0 % (ref 0–1)
Hemoglobin: 12.1 g/dL (ref 12.0–15.0)
Lymphocytes Relative: 34 % (ref 12–46)
MCHC: 32.1 g/dL (ref 30.0–36.0)
Neutro Abs: 4.6 10*3/uL (ref 1.7–7.7)
Neutrophils Relative %: 55 % (ref 43–77)
RDW: 14.2 % (ref 11.5–15.5)
WBC: 8.3 10*3/uL (ref 4.0–10.5)

## 2012-01-08 LAB — GLUCOSE, RANDOM: Glucose, Bld: 87 mg/dL (ref 70–99)

## 2012-01-08 LAB — FOLLICLE STIMULATING HORMONE: FSH: 6.9 m[IU]/mL

## 2012-01-08 MED ORDER — NORETHINDRONE 0.35 MG PO TABS: 1.0000 | ORAL_TABLET | Freq: Every day | ORAL | Status: DC

## 2012-01-11 ENCOUNTER — Other Ambulatory Visit: Payer: Self-pay | Admitting: Family Medicine

## 2012-01-19 ENCOUNTER — Ambulatory Visit
Admission: RE | Admit: 2012-01-19 | Discharge: 2012-01-19 | Disposition: A | Discharge status: Home / Self Care | Payer: Managed Care, Other (non HMO) | Source: Ambulatory Visit | Attending: Obstetrics and Gynecology | Admitting: Obstetrics and Gynecology

## 2012-01-19 DIAGNOSIS — Z1231 Encounter for screening mammogram for malignant neoplasm of breast: Secondary | ICD-10-CM

## 2012-01-22 ENCOUNTER — Other Ambulatory Visit: Payer: Self-pay | Admitting: *Deleted

## 2012-01-22 DIAGNOSIS — N63 Unspecified lump in unspecified breast: Secondary | ICD-10-CM

## 2012-01-26 ENCOUNTER — Ambulatory Visit
Admission: RE | Admit: 2012-01-26 | Discharge: 2012-01-26 | Disposition: A | Discharge status: Home / Self Care | Payer: Managed Care, Other (non HMO) | Source: Ambulatory Visit | Attending: Obstetrics and Gynecology | Admitting: Obstetrics and Gynecology

## 2012-01-26 ENCOUNTER — Other Ambulatory Visit: Payer: Self-pay | Admitting: Family Medicine

## 2012-01-26 DIAGNOSIS — N63 Unspecified lump in unspecified breast: Secondary | ICD-10-CM

## 2012-01-27 ENCOUNTER — Other Ambulatory Visit: Payer: Self-pay | Admitting: *Deleted

## 2012-01-27 DIAGNOSIS — N63 Unspecified lump in unspecified breast: Secondary | ICD-10-CM

## 2012-02-03 ENCOUNTER — Other Ambulatory Visit: Payer: Self-pay

## 2012-02-03 DIAGNOSIS — IMO0001 Reserved for inherently not codable concepts without codable children: Secondary | ICD-10-CM

## 2012-02-03 MED ORDER — NORETHINDRONE 0.35 MG PO TABS: 1.0000 | ORAL_TABLET | Freq: Every day | ORAL | Status: DC

## 2012-02-04 MED ORDER — NORETHINDRONE 0.35 MG PO TABS: 1.0000 | ORAL_TABLET | Freq: Every day | ORAL | Status: DC

## 2012-02-04 NOTE — Telephone Encounter (Signed)
Addended byValeda Malm L on: 02/04/2012 02:40 PM   Modules accepted: Orders

## 2012-03-08 ENCOUNTER — Telehealth: Payer: Self-pay

## 2012-03-08 DIAGNOSIS — F419 Anxiety disorder, unspecified: Secondary | ICD-10-CM

## 2012-03-08 NOTE — Telephone Encounter (Signed)
Pharmacy is having trouble activating Dr. Patsy Lager in surescrips. Pharmacy would like to refill Xanax .5mg .

## 2012-03-09 MED ORDER — ALPRAZOLAM 0.5 MG PO TABS: 0.5000 mg | ORAL_TABLET | ORAL | Status: DC | PRN

## 2012-03-09 NOTE — Telephone Encounter (Signed)
RX SENT TO PHARMACY

## 2012-03-09 NOTE — Telephone Encounter (Signed)
Done and printed

## 2012-04-23 ENCOUNTER — Other Ambulatory Visit: Payer: Self-pay

## 2012-04-23 DIAGNOSIS — F419 Anxiety disorder, unspecified: Secondary | ICD-10-CM

## 2012-04-23 MED ORDER — ALPRAZOLAM 0.5 MG PO TABS: 0.5000 mg | ORAL_TABLET | ORAL | Status: DC | PRN

## 2012-05-27 ENCOUNTER — Other Ambulatory Visit: Payer: Self-pay | Admitting: Physician Assistant

## 2012-05-27 DIAGNOSIS — F419 Anxiety disorder, unspecified: Secondary | ICD-10-CM

## 2012-05-27 MED ORDER — ALPRAZOLAM 0.5 MG PO TABS: 0.5000 mg | ORAL_TABLET | ORAL | Status: DC | PRN

## 2012-05-29 ENCOUNTER — Telehealth: Payer: Self-pay

## 2012-05-29 NOTE — Telephone Encounter (Signed)
Pt called needs call back  From Dr Patsy Lager in reguards to something personal.  Please call [phone]

## 2012-05-29 NOTE — Telephone Encounter (Signed)
Called- no answer.  LOMOM

## 2012-05-30 ENCOUNTER — Ambulatory Visit (INDEPENDENT_AMBULATORY_CARE_PROVIDER_SITE_OTHER): Payer: Managed Care, Other (non HMO) | Admitting: Family Medicine

## 2012-05-30 VITALS — BP 132/84 | HR 70 | Temp 97.4°F | Resp 16 | Ht 63.5 in | Wt 217.8 lb

## 2012-05-30 DIAGNOSIS — F419 Anxiety disorder, unspecified: Secondary | ICD-10-CM

## 2012-05-30 DIAGNOSIS — F411 Generalized anxiety disorder: Secondary | ICD-10-CM

## 2012-05-30 DIAGNOSIS — F329 Major depressive disorder, single episode, unspecified: Secondary | ICD-10-CM

## 2012-05-30 MED ORDER — BUPROPION HCL ER (SR) 150 MG PO TB12: 150.0000 mg | ORAL_TABLET | Freq: Two times a day (BID) | ORAL | Status: DC
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
## 2012-05-30 NOTE — Patient Instructions (Addendum)
Start taking your Wellbutrin just once a day- after 3 days increase to twice a day.  If you start having thoughts of hurting yourself please GET HELP right away!  You can call 911 or drive to [facility].   Please give me a call in a few days with an update.

## 2012-05-30 NOTE — Telephone Encounter (Signed)
Pt seen in clinic today.  

## 2012-06-01 ENCOUNTER — Telehealth: Payer: Self-pay

## 2012-06-01 NOTE — Telephone Encounter (Signed)
Dr.Copland,  Thea Silversmith is pt's counselor and is calling regarding pt's traumatic stress disorder, she would like to know if you would recommend pt taking her Wellbutrin in conjunction  with an SSRI. Please give her a call at [phone]

## 2012-06-01 NOTE — Telephone Encounter (Signed)
Called counselor to advise Dr Patsy Lager not in office today will be tomorrow before call back wanted her to know we have gotten message and it is being routed and if she needs sooner than tomorrow she was instructed to call me back and I can ask another provider.

## 2012-06-03 NOTE — Telephone Encounter (Signed)
Called and talked with Victorino Dike- I do think an SSRI may be a good idea, but we just started the wellbutrin and Angelique Blonder does tend to notice SE with her medications.  Will wait and see how she does, but consider adding an SSRI in the next few weeks

## 2012-06-04 ENCOUNTER — Ambulatory Visit (INDEPENDENT_AMBULATORY_CARE_PROVIDER_SITE_OTHER): Payer: Managed Care, Other (non HMO) | Admitting: Emergency Medicine

## 2012-06-04 VITALS — BP 164/96 | HR 76 | Temp 98.5°F | Resp 20 | Ht 63.5 in | Wt 211.0 lb

## 2012-06-04 DIAGNOSIS — G2402 Drug induced acute dystonia: Secondary | ICD-10-CM

## 2012-06-04 DIAGNOSIS — F329 Major depressive disorder, single episode, unspecified: Secondary | ICD-10-CM

## 2012-06-04 MED ORDER — ESCITALOPRAM OXALATE 20 MG PO TABS: 20.0000 mg | ORAL_TABLET | Freq: Every day | ORAL | Status: DC

## 2012-06-04 MED ORDER — DIPHENHYDRAMINE HCL 50 MG/ML IJ SOLN
50.0000 mg | Freq: Once | INTRAMUSCULAR | Status: AC
  Administered 2012-06-04: 50 mg via INTRAMUSCULAR

## 2012-06-04 NOTE — Progress Notes (Signed)
Date:  06/04/2012   Name:  Ann Walter   DOB:  10-04-1966   MRN:  [id number] Gender: female  Age: 46 y.o.  PCP:  Abbe Amsterdam, MD    Chief Complaint: Medication Problem and Dizziness   History of Present Illness:  Ann Walter is a 46 y.o. pleasant patient who presents with the following:  History of depression and suicide gesture.  Put on wellbutrin and today had a dystonic reaction.  Tongue protruding, can't control head.  No respiratory distress.    Patient Active Problem List  Diagnosis  . RUQ PAIN  . ABDOMINAL PAIN, PERIUMBILICAL  . URINALYSIS, ABNORMAL  . Periumbilical pain  . Depression  . HTN (hypertension)  . Benign hematuria    Past Medical History  Diagnosis Date  . Obesity   . UTI (urinary tract infection), uncomplicated   . Depression   . HTN (hypertension)   . Benign hematuria 12/2009    WORKUP WITH DR. NESI WAS NEGATIVE  . Heart murmur     Past Surgical History  Procedure Date  . Cholecystectomy 10/2010  . Cystectomy     x 2 from chest    History  Substance Use Topics  . Smoking status: Never Smoker   . Smokeless tobacco: Never Used  . Alcohol Use: No    Family History  Problem Relation Age of Onset  . Diabetes Mother   . Heart disease Mother     HAD TRIPLE BYPASS  . Hypertension Mother   . Kidney disease Father   . Breast cancer Sister   . Breast cancer Maternal Grandmother   . Cancer Maternal Grandmother     LYMPH NODE CANCER  . Colon cancer Neg Hx     Allergies  Allergen Reactions  . Sulfonamide Derivatives     Medication list has been reviewed and updated.  Current Outpatient Prescriptions on File Prior to Visit  Medication Sig Dispense Refill  . ALPRAZolam (XANAX) 0.5 MG tablet Take 1 tablet (0.5 mg total) by mouth as needed.  30 tablet  0  . buPROPion (WELLBUTRIN SR) 150 MG 12 hr tablet Take 1 tablet (150 mg total) by mouth 2 (two) times daily.  60 tablet  3  . lisinopril (PRINIVIL,ZESTRIL) 20 MG  tablet Take 2 tablets (40 mg total) by mouth daily.  180 tablet  3  . norethindrone (ORTHO MICRONOR) 0.35 MG tablet Take 1 tablet (0.35 mg total) by mouth daily.  3 Package  3  . ranitidine (ZANTAC) 75 MG tablet Take 75 mg by mouth 2 (two) times daily. Prn       . escitalopram (LEXAPRO) 20 MG tablet Take 1 tablet (20 mg total) by mouth daily.  30 tablet  5  . HYDROcodone-acetaminophen (NORCO) 5-325 MG per tablet Take 1 tab every 8 hours as needed for pain  15 tablet  0  . hyoscyamine (LEVSIN SL) 0.125 MG SL tablet Place 0.125 mg under the tongue every 4 (four) hours as needed.        . ondansetron (ZOFRAN) 8 MG tablet Take 8 mg by mouth. Four to six hours as needed for nausea       . simethicone (MYLICON) 125 MG chewable tablet Chew 125 mg by mouth every 6 (six) hours as needed.        Marland Kitchen DISCONTD: norethindrone-ethinyl estradiol (JUNEL FE,GILDESS FE,LOESTRIN FE) 1-20 MG-MCG tablet Take 1 tablet by mouth daily.  1 Package  12   No current facility-administered medications on file prior  to visit.    Review of Systems:  As per HPI, otherwise negative.    Physical Examination: Filed Vitals:   06/04/12 1638  BP: 164/96  Pulse: 76  Temp: 98.5 F (36.9 C)  Resp: 20   Filed Vitals:   06/04/12 1638  Height: 5' 3.5" (1.613 m)  Weight: 211 lb (95.709 kg)   Body mass index is 36.79 kg/(m^2). Ideal Body Weight: Weight in (lb) to have BMI = 25: 143.1   GEN: WDWN, NAD, Non-toxic, A & O x 3 HEENT: Atraumatic, Normocephalic. Neck supple. No masses, No LAD. Ears and Nose: No external deformity. CV: RRR, No M/G/R. No JVD. No thrill. No extra heart sounds. PULM: CTA B, no wheezes, crackles, rhonchi. No retractions. No resp. distress. No accessory muscle use. ABD: S, NT, ND, +BS. No rebound. No HSM. EXTR: No c/c/e NEURO Normal gait. Tremulous, cannot control tongue with involuntary movements of neck PSYCH: Normally interactive. Conversant. Not depressed or anxious appearing.  Calm demeanor.     Assessment and Plan: Dystonic reaction to wellbutrin Benadryl Depression lexapro Follow up with Dr. Gwynne Edinger, Tessa Lerner, MD

## 2012-06-04 NOTE — Progress Notes (Deleted)
  Subjective:    Patient ID: Ann Walter, female    DOB: 1966/09/14, 46 y.o.   MRN: [id number]  HPI    Review of Systems     Objective:   Physical Exam        Assessment & Plan:

## 2012-06-09 ENCOUNTER — Ambulatory Visit (INDEPENDENT_AMBULATORY_CARE_PROVIDER_SITE_OTHER): Payer: Managed Care, Other (non HMO) | Admitting: Family Medicine

## 2012-06-09 VITALS — BP 115/65 | HR 74 | Temp 98.7°F | Resp 16 | Ht 64.0 in | Wt 212.0 lb

## 2012-06-09 DIAGNOSIS — F3289 Other specified depressive episodes: Secondary | ICD-10-CM

## 2012-06-09 DIAGNOSIS — F329 Major depressive disorder, single episode, unspecified: Secondary | ICD-10-CM

## 2012-06-09 NOTE — Progress Notes (Signed)
[facility] [address], [place] [address] [phone]  Date:  06/09/2012   Name:  Ann Walter   DOB:  Dec 01, 1965   MRN:  [id number]  PCP:  Abbe Amsterdam, MD    Chief Complaint: Advice Only   History of Present Illness:  Ann Walter is a 46 y.o. very pleasant female patient who presents with the following:  We started her on wellbutrin about 2 weeks ago- however she had a dystonic reaction and was seen here on the 8/9 at which Wellbutrin was discontinued and she was started on Lexapro.  Since her last visit Angelique Blonder feels that she is becoming more stable. She has not had any suicidal ideation.  They are making slow progress towards getting back into their home.   She would like FMLA paperwork filled out when we receive it- her job is faxing it to the office.  Her boss suggested that she do FMLA- she received a verbal warning yesterday and she is just not able to function at her job right now.    She just took one of the lexapro pills so far- she had some vomiting afterwards, but is not sure if her vomiting was just due to stress.  She did not have any rash, itching, or other signs of a serious allergic reaction.   Her FMLA leave will start today. Will plan to have her out for 4 weeks- if she is able to go back sooner this is ok.   Angelique Blonder is still seeing her counselor Victorino Dike, with whom she has a very good relationship. She is with [facility] [place].  She has an appt this Thursday.    Patient Active Problem List  Diagnosis  . RUQ PAIN  . ABDOMINAL PAIN, PERIUMBILICAL  . URINALYSIS, ABNORMAL  . Periumbilical pain  . Depression  . HTN (hypertension)  . Benign hematuria    Past Medical History  Diagnosis Date  . Obesity   . UTI (urinary tract infection), uncomplicated   . Depression   . HTN (hypertension)   . Benign hematuria 12/2009    WORKUP WITH DR. NESI WAS NEGATIVE  . Heart murmur     Past  Surgical History  Procedure Date  . Cholecystectomy 10/2010  . Cystectomy     x 2 from chest    History  Substance Use Topics  . Smoking status: Never Smoker   . Smokeless tobacco: Never Used  . Alcohol Use: No    Family History  Problem Relation Age of Onset  . Diabetes Mother   . Heart disease Mother     HAD TRIPLE BYPASS  . Hypertension Mother   . Kidney disease Father   . Breast cancer Sister   . Breast cancer Maternal Grandmother   . Cancer Maternal Grandmother     LYMPH NODE CANCER  . Colon cancer Neg Hx     Allergies  Allergen Reactions  . Sulfonamide Derivatives     Medication list has been reviewed and updated.  Current Outpatient Prescriptions on File Prior to Visit  Medication Sig Dispense Refill  . ALPRAZolam (XANAX) 0.5 MG tablet Take 1 tablet (0.5 mg total) by mouth as needed.  30 tablet  0  . hyoscyamine (LEVSIN SL) 0.125 MG SL tablet Place 0.125 mg under the tongue every 4 (four) hours as needed.        Marland Kitchen lisinopril (PRINIVIL,ZESTRIL) 20 MG tablet Take 2 tablets (40 mg total) by  mouth daily.  180 tablet  3  . ondansetron (ZOFRAN) 8 MG tablet Take 8 mg by mouth. Four to six hours as needed for nausea       . ranitidine (ZANTAC) 75 MG tablet Take 75 mg by mouth 2 (two) times daily. Prn       . buPROPion (WELLBUTRIN SR) 150 MG 12 hr tablet Take 1 tablet (150 mg total) by mouth 2 (two) times daily.  60 tablet  3  . escitalopram (LEXAPRO) 20 MG tablet Take 1 tablet (20 mg total) by mouth daily.  30 tablet  5  . HYDROcodone-acetaminophen (NORCO) 5-325 MG per tablet Take 1 tab every 8 hours as needed for pain  15 tablet  0  . norethindrone (ORTHO MICRONOR) 0.35 MG tablet Take 1 tablet (0.35 mg total) by mouth daily.  3 Package  3  . simethicone (MYLICON) 125 MG chewable tablet Chew 125 mg by mouth every 6 (six) hours as needed.        Marland Kitchen DISCONTD: norethindrone-ethinyl estradiol (JUNEL FE,GILDESS FE,LOESTRIN FE) 1-20 MG-MCG tablet Take 1 tablet by mouth daily.   1 Package  12    Review of Systems:  As per HPI- otherwise negative.   Physical Examination: Filed Vitals:   06/09/12 0826  BP: 115/65  Pulse: 74  Temp: 98.7 F (37.1 C)  Resp: 16   Filed Vitals:   06/09/12 0826  Height: 5\' 4"  (1.626 m)  Weight: 212 lb (96.163 kg)   Body mass index is 36.39 kg/(m^2). Ideal Body Weight: Weight in (lb) to have BMI = 25: 145.3   GEN: WDWN, NAD, Non-toxic, A & O x 3, obese HEENT: Atraumatic, Normocephalic. Neck supple. No masses, No LAD. Ears and Nose: No external deformity. CV: RRR, No M/G/R. No JVD. No thrill. No extra heart sounds. PULM: CTA B, no wheezes, crackles, rhonchi. No retractions. No resp. distress. No accessory muscle [place] EXTR: No c/c/e NEURO Normal gait.  PSYCH: Normally interactive. Conversant. Calmer and more stable than at her last visit.    Assessment and Plan: 1. Depression    Angelique Blonder will try again with the lexapro, as it is likely that her vomiting was due to stress.  She has tolerated other SSRIs in the past, but would like to at least give lexapro a good try as she already has a full bottle available.  She will call me in about one week with an update, sooner if she has any other problems.  I will complete her FMLA paperwork when received- she may have 4 weeks off from today.    Abbe Amsterdam, MD

## 2012-06-29 ENCOUNTER — Telehealth: Payer: Self-pay | Admitting: *Deleted

## 2012-06-29 NOTE — Telephone Encounter (Signed)
Pt was referred to a physciatrist and the dr is not in her network, she would like to see if one could be found in her network please contact pt with info [phone]

## 2012-06-29 NOTE — Telephone Encounter (Signed)
Spoke with Dr. Patsy Lager and called to advise patient to contact insurance to see what providers are in network, then let us know and we will be happy to fax notes over.  Patient understood.

## 2012-07-08 ENCOUNTER — Ambulatory Visit (INDEPENDENT_AMBULATORY_CARE_PROVIDER_SITE_OTHER): Payer: Managed Care, Other (non HMO) | Admitting: Family Medicine

## 2012-07-08 VITALS — BP 130/88 | HR 85 | Temp 98.3°F | Resp 16 | Ht 65.0 in | Wt 211.0 lb

## 2012-07-08 DIAGNOSIS — Z23 Encounter for immunization: Secondary | ICD-10-CM

## 2012-07-08 DIAGNOSIS — F419 Anxiety disorder, unspecified: Secondary | ICD-10-CM

## 2012-07-08 DIAGNOSIS — F411 Generalized anxiety disorder: Secondary | ICD-10-CM

## 2012-07-08 DIAGNOSIS — R109 Unspecified abdominal pain: Secondary | ICD-10-CM

## 2012-07-08 MED ORDER — HYOSCYAMINE SULFATE 0.125 MG SL SUBL: 0.1250 mg | SUBLINGUAL_TABLET | SUBLINGUAL | Status: DC | PRN

## 2012-07-08 MED ORDER — ALPRAZOLAM 0.5 MG PO TABS: 0.5000 mg | ORAL_TABLET | Freq: Three times a day (TID) | ORAL | Status: DC | PRN

## 2012-07-08 NOTE — Progress Notes (Signed)
[facility] [address], [place] [address] [phone]  Date:  07/08/2012   Name:  Ann Walter   DOB:  10-22-1966   MRN:  [id number]  PCP:  Abbe Amsterdam, MD    Chief Complaint: Medication Problem and Medication Refill   History of Present Illness:  Ann Walter is a 46 y.o. very pleasant female patient who presents with the following:  Last seen by myself just about one month ago.  Completed FMLA paperwork for her to be out of work for 4 weeks- she was suffering from depression and an acute stress reaction.  She plans to return to work on Monday.  She does plan to consider changing to a new job as she finds her job to be stressful and frustrating.  However, she is ready to RTW and thinks she can handle it.    She did not continue to take the lexapro- stopped because of sexual side effects.  She has been talking to family and friends as well as her counselor about what is bothering her.  She and her husband were able to move back into their house which is a huge relief.    She does have a psychiatry appt scheduled for 07/28/12.  She plans to go through with seeing a psychiatrist.    She would like to continue her xanax, and wonders if we can increase the dose.  She also has used hyoscysamine for her GI symptoms- this was rx by Dr. Leone Payor and does help her, and she would like to have a refill.  Her abdominal symptoms are not different- she has suffered from cramps and pain intermittently for a long time.  She has always had a sensitive stomach since she was a child.   She is not having any SI at this time.    She is taking the xanax BID right now.  She is able to sleep well again.     Patient Active Problem List  Diagnosis  . RUQ PAIN  . ABDOMINAL PAIN, PERIUMBILICAL  . URINALYSIS, ABNORMAL  . Periumbilical pain  . Depression  . HTN (hypertension)  . Benign hematuria    Past Medical History  Diagnosis Date  . Obesity   . UTI  (urinary tract infection), uncomplicated   . Depression   . HTN (hypertension)   . Benign hematuria 12/2009    WORKUP WITH DR. NESI WAS NEGATIVE  . Heart murmur     Past Surgical History  Procedure Date  . Cholecystectomy 10/2010  . Cystectomy     x 2 from chest    History  Substance Use Topics  . Smoking status: Never Smoker   . Smokeless tobacco: Never Used  . Alcohol Use: No    Family History  Problem Relation Age of Onset  . Diabetes Mother   . Heart disease Mother     HAD TRIPLE BYPASS  . Hypertension Mother   . Kidney disease Father   . Breast cancer Sister   . Breast cancer Maternal Grandmother   . Cancer Maternal Grandmother     LYMPH NODE CANCER  . Colon cancer Neg Hx     Allergies  Allergen Reactions  . Wellbutrin (Bupropion)     Throat swelling   . Sulfonamide Derivatives     Medication list has been reviewed and updated.  Current Outpatient Prescriptions on File Prior to Visit  Medication Sig Dispense Refill  . ALPRAZolam (XANAX) 0.5 MG tablet Take 1 tablet (  0.5 mg total) by mouth as needed.  30 tablet  0  . hyoscyamine (LEVSIN SL) 0.125 MG SL tablet Place 0.125 mg under the tongue every 4 (four) hours as needed.       Marland Kitchen lisinopril (PRINIVIL,ZESTRIL) 20 MG tablet Take 2 tablets (40 mg total) by mouth daily.  180 tablet  3  . buPROPion (WELLBUTRIN SR) 150 MG 12 hr tablet Take 1 tablet (150 mg total) by mouth 2 (two) times daily.  60 tablet  3  . escitalopram (LEXAPRO) 20 MG tablet Take 1 tablet (20 mg total) by mouth daily.  30 tablet  5  . HYDROcodone-acetaminophen (NORCO) 5-325 MG per tablet Take 1 tab every 8 hours as needed for pain  15 tablet  0  . norethindrone (ORTHO MICRONOR) 0.35 MG tablet Take 1 tablet (0.35 mg total) by mouth daily.  3 Package  3  . ondansetron (ZOFRAN) 8 MG tablet Take 8 mg by mouth. Four to six hours as needed for nausea       . ranitidine (ZANTAC) 75 MG tablet Take 75 mg by mouth 2 (two) times daily. Prn       .  simethicone (MYLICON) 125 MG chewable tablet Chew 125 mg by mouth every 6 (six) hours as needed.        Marland Kitchen DISCONTD: norethindrone-ethinyl estradiol (JUNEL FE,GILDESS FE,LOESTRIN FE) 1-20 MG-MCG tablet Take 1 tablet by mouth daily.  1 Package  12    Review of Systems:  As per HPI- otherwise negative.   Physical Examination: Filed Vitals:   07/08/12 1147  BP: 130/88  Pulse: 85  Temp: 98.3 F (36.8 C)  Resp: 16   Filed Vitals:   07/08/12 1147  Height: 5\' 5"  (1.651 m)  Weight: 211 lb (95.709 kg)   Body mass index is 35.11 kg/(m^2). Ideal Body Weight: Weight in (lb) to have BMI = 25: 149.9   GEN: WDWN, NAD, Non-toxic, A & O x 3, overweight HEENT: Atraumatic, Normocephalic. Neck supple. No masses, No LAD. Ears and Nose: No external deformity. CV: RRR, No M/G/R. No JVD. No thrill. No extra heart sounds. PULM: CTA B, no wheezes, crackles, rhonchi. No retractions. No resp. distress. No accessory muscle use. ABD: S, NT, ND, +BS. No rebound. No HSM. EXTR: No c/c/e NEURO Normal gait.  PSYCH: Normally interactive. Conversant. Is sometimes tearful but states that she is just emotional and does not really feel sad.     Assessment and Plan: 1. Anxiety  ALPRAZolam (XANAX) 0.5 MG tablet  2. Abdominal  pain, other specified site  hyoscyamine (LEVSIN SL) 0.125 MG SL tablet   I am pleased that [place] plans to return to work next week- this is a good step for her.  I will increase her xanax to TID as needed.   Encouraged her to keep her appt with psychiatry.  Also refilled her levsin to use as needed.  Her abdominal symptoms are stable and this medication has helped her in the past.    Abbe Amsterdam, MD

## 2012-07-08 NOTE — Patient Instructions (Addendum)
Best of luck in your return to work- we are proud of you!  Let me know how you are doing and if there is anything that we can do to help

## 2012-07-28 ENCOUNTER — Encounter (HOSPITAL_COMMUNITY): Payer: Self-pay | Admitting: Psychiatry

## 2012-07-28 ENCOUNTER — Ambulatory Visit (INDEPENDENT_AMBULATORY_CARE_PROVIDER_SITE_OTHER): Payer: Managed Care, Other (non HMO) | Admitting: Psychiatry

## 2012-07-28 VITALS — BP 160/92 | HR 69 | Wt 210.0 lb

## 2012-07-28 DIAGNOSIS — F39 Unspecified mood [affective] disorder: Secondary | ICD-10-CM

## 2012-07-28 MED ORDER — LAMOTRIGINE 25 MG PO TABS: ORAL_TABLET | ORAL | Status: DC
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
## 2012-08-18 ENCOUNTER — Ambulatory Visit (HOSPITAL_COMMUNITY): Payer: Self-pay | Admitting: Psychiatry

## 2012-08-25 ENCOUNTER — Other Ambulatory Visit (HOSPITAL_COMMUNITY): Payer: Self-pay | Admitting: Psychiatry

## 2012-08-25 ENCOUNTER — Ambulatory Visit (INDEPENDENT_AMBULATORY_CARE_PROVIDER_SITE_OTHER): Payer: Managed Care, Other (non HMO) | Admitting: Marriage and Family Therapist

## 2012-08-25 DIAGNOSIS — F39 Unspecified mood [affective] disorder: Secondary | ICD-10-CM

## 2012-08-25 DIAGNOSIS — F411 Generalized anxiety disorder: Secondary | ICD-10-CM

## 2012-08-25 DIAGNOSIS — F0630 Mood disorder due to known physiological condition, unspecified: Secondary | ICD-10-CM

## 2012-08-25 MED ORDER — LAMOTRIGINE 25 MG PO TABS: ORAL_TABLET | ORAL | Status: DC
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
## 2012-08-26 ENCOUNTER — Ambulatory Visit (HOSPITAL_COMMUNITY): Payer: Self-pay | Admitting: Marriage and Family Therapist

## 2012-08-31 ENCOUNTER — Encounter (HOSPITAL_COMMUNITY): Payer: Self-pay | Admitting: Psychiatry

## 2012-08-31 ENCOUNTER — Ambulatory Visit (INDEPENDENT_AMBULATORY_CARE_PROVIDER_SITE_OTHER): Payer: Managed Care, Other (non HMO) | Admitting: Psychiatry

## 2012-08-31 VITALS — Wt 210.0 lb

## 2012-08-31 DIAGNOSIS — F39 Unspecified mood [affective] disorder: Secondary | ICD-10-CM

## 2012-08-31 MED ORDER — LAMOTRIGINE 100 MG PO TABS: 100.0000 mg | ORAL_TABLET | Freq: Every day | ORAL | Status: DC
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
## 2012-09-22 ENCOUNTER — Ambulatory Visit (INDEPENDENT_AMBULATORY_CARE_PROVIDER_SITE_OTHER): Payer: Managed Care, Other (non HMO) | Admitting: Marriage and Family Therapist

## 2012-09-22 ENCOUNTER — Encounter: Payer: Self-pay | Admitting: Radiology

## 2012-09-22 DIAGNOSIS — M722 Plantar fascial fibromatosis: Secondary | ICD-10-CM | POA: Insufficient documentation

## 2012-09-22 DIAGNOSIS — F411 Generalized anxiety disorder: Secondary | ICD-10-CM

## 2012-09-22 DIAGNOSIS — F0630 Mood disorder due to known physiological condition, unspecified: Secondary | ICD-10-CM
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
## 2012-09-28 ENCOUNTER — Ambulatory Visit (INDEPENDENT_AMBULATORY_CARE_PROVIDER_SITE_OTHER): Payer: Managed Care, Other (non HMO) | Admitting: Marriage and Family Therapist

## 2012-09-28 DIAGNOSIS — F0630 Mood disorder due to known physiological condition, unspecified: Secondary | ICD-10-CM

## 2012-09-28 DIAGNOSIS — F411 Generalized anxiety disorder: Secondary | ICD-10-CM
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
## 2012-10-01 ENCOUNTER — Other Ambulatory Visit: Payer: Self-pay | Admitting: Family Medicine

## 2012-10-02 ENCOUNTER — Other Ambulatory Visit: Payer: Self-pay | Admitting: Family Medicine

## 2012-10-02 NOTE — Telephone Encounter (Signed)
Please give her a call- I saw that she has been seeing psychiatry and had a hard time as of late.  Is her psychiatrist not prescribing her xanax now?  Please try to get a few more details abut her current mental health follow- up plans.  She is welcome to come and see me to discuss at her convenience

## 2012-10-03 NOTE — Telephone Encounter (Signed)
Called and LMOM- asked her to please give me a call so we can discuss recent events- or I would be happy to see her face to face as well

## 2012-10-04 ENCOUNTER — Other Ambulatory Visit: Payer: Self-pay

## 2012-10-04 DIAGNOSIS — F419 Anxiety disorder, unspecified: Secondary | ICD-10-CM

## 2012-10-04 NOTE — Telephone Encounter (Signed)
Called twice- no answer. I would like to talk with her myself

## 2012-10-04 NOTE — Telephone Encounter (Signed)
Pt Ann Walter after getting a call from Dr Patsy Lager yesterday asking for Ann Walter. Checked w/Dr Copland who asked that I check status of pt since admission to [facility] and if pt is now seeing a psychiatrist who is managing her psych meds. Pt reports that she is much better since her admission. She was started on Lanotrigine and has worked her way up to 100 mg QD. She was seeing a psychiatrist at first, but now just seeing a therapist. She would like to have Dr Patsy Lager cont to manage her Xanax since she knows her so well and the therapist can not Rx medications. She stated that she can come in to see Dr Patsy Lager when Dr Patsy Lager would like her to, but had not planned to come in right now since she is so improved.

## 2012-10-05 ENCOUNTER — Ambulatory Visit (INDEPENDENT_AMBULATORY_CARE_PROVIDER_SITE_OTHER): Payer: Managed Care, Other (non HMO) | Admitting: Marriage and Family Therapist

## 2012-10-05 DIAGNOSIS — F0630 Mood disorder due to known physiological condition, unspecified: Secondary | ICD-10-CM

## 2012-10-05 DIAGNOSIS — F411 Generalized anxiety disorder: Secondary | ICD-10-CM

## 2012-10-05 NOTE — Telephone Encounter (Signed)
Pt called back and I told her Dr Patsy Lager had tried to reach her again. Pt apologized and said that if we can get the message to her, and she wants to try to call back now, pt will be sure to answer. Dr Patsy Lager, I'm sending this back ASAP.

## 2012-10-05 NOTE — Progress Notes (Signed)
   THERAPIST PROGRESS NOTE  Session Time:  2:00 - 3:00 p.m.  Participation Level: Active  Behavioral Response: CasualAlertAnxious  Type of Therapy: Individual Therapy  Treatment Goals addressed: Coping  Interventions: Strength-based, Supportive and Family Systems  Summary: Ann Walter is a 46 y.o. female who presents with depression and anxiety.  She was referred by Dr. Lolly Mustache.  Patient reports she has been having bouts of anxiety but has been able to calm herself down within minutes.  She reported beginning to panic, but told herself, "I'm not going to do this," along with trying to breathe more slowly.  Patient states she also wanted to talk about her husband.  She reports feeling frustrated about him because he is not pulling his weight with household responsibilities.  Patient also talked about losing her mother and brother in particular and how she is afraid of this idea.  She also talked about "one minute you're here and the next you're dead and gone."  Patient appeared tearful talking about loss through death.  Patient also reported over the last week her anxiety has been about a 3-5 on 0-10 scale and her depression a 1-3.  Patient states she gives herself high marks on how she handled her potential panic attack.  Suicidal/Homicidal: Nowithout intent/plan  Therapist Response:  Commended patient for being able to talk herself out of a panic attack.  Discussed patient needing to be the one to tell her husband she is no longer going to take most of the responsibility at home and then follow through with this decision.  Also discussed generally women taking on more responsibility due to being caregivers.  Also educated patient about anxiety and depression from a cognitive-behavior approach and taught patient a biofeedback breathing technique.  Patient will use the technique anywhere from 30 seconds to three minutes on and off throughout the day and 10 minutes before she goes to sleep.   She will not do this technique when she is going into a panic attack since it will be too late to take effect.  Told patient the idea was for patient to develop a habit of breathing correctly to avoid anxiety and panic attacks.  Discussed patient's fear of losing people she loves and death possibly being the result of her age (developmental).  Discussed ways patient can find meaning in her life from the idea that "life is short."  Pointed out to patient that death was an emotional issue for her and will continue to discuss the subject in order to help patient be able to talk about death without being tearful every time (self-report).  Plan: Return again in 1 weeks.  Diagnosis: Axis I: Mood d/o NOS; GAD    Axis II: Deferred    Marcia Hartwell, LMFT 10/05/2012

## 2012-10-05 NOTE — Telephone Encounter (Signed)
Have continued to try her but cannot reach

## 2012-10-05 NOTE — Telephone Encounter (Signed)
I have left message for her to call back.

## 2012-10-06 MED ORDER — ALPRAZOLAM 0.5 MG PO TABS: 0.5000 mg | ORAL_TABLET | Freq: Three times a day (TID) | ORAL | Status: DC | PRN

## 2012-10-06 NOTE — Telephone Encounter (Signed)
Called and was able to talk with her.  She is doing well, but does need a RF of her xanax.  Will do this- she is taking 0.5 mg TID and is stable at this dosage, she received an rx for 90 in September with 2 refills so she is due

## 2012-10-12 ENCOUNTER — Ambulatory Visit (HOSPITAL_COMMUNITY): Payer: Self-pay | Admitting: Marriage and Family Therapist

## 2012-10-13 ENCOUNTER — Ambulatory Visit (HOSPITAL_COMMUNITY): Payer: Managed Care, Other (non HMO) | Admitting: Psychiatry

## 2012-10-24 ENCOUNTER — Other Ambulatory Visit (HOSPITAL_COMMUNITY): Payer: Self-pay | Admitting: Psychiatry

## 2012-10-25 ENCOUNTER — Telehealth (HOSPITAL_COMMUNITY): Payer: Self-pay

## 2012-10-25 ENCOUNTER — Other Ambulatory Visit (HOSPITAL_COMMUNITY): Payer: Self-pay | Admitting: Psychiatry

## 2012-10-25 DIAGNOSIS — F39 Unspecified mood [affective] disorder: Secondary | ICD-10-CM

## 2012-10-25 MED ORDER — LAMOTRIGINE 100 MG PO TABS: 100.0000 mg | ORAL_TABLET | Freq: Every day | ORAL | Status: DC

## 2012-10-26 ENCOUNTER — Ambulatory Visit (HOSPITAL_COMMUNITY): Payer: Self-pay | Admitting: Marriage and Family Therapist

## 2012-10-28 NOTE — Progress Notes (Unsigned)
   THERAPIST PROGRESS NOTE  Session Time:  8:00 - 9:00 a.m.  Participation Level: {BHH PARTICIPATION LEVEL:22264}  Behavioral Response: {Appearance:22683}{BHH LEVEL OF CONSCIOUSNESS:22305}{BHH MOOD:22306}  Type of Therapy: Individual Therapy  Treatment Goals addressed: Coping  Interventions: {CHL AMB BH Type of Intervention:21022753}  Summary: Ann Walter is a 47 y.o. female who presents with mood disorder and GAD.  She was referred by Dr. Lolly Mustache.   Suicidal/Homicidal: {BHH YES OR NO:22294}{yes/no/with/without intent/plan:22693}  Therapist Response: ***  Plan: Return again in 1 weeks.  Diagnosis: Axis I: Mood Disorder NOS; GAD    Axis II: Deferred    Noa Galvao, LMFT, CTS 10/28/2012

## 2012-10-29 ENCOUNTER — Ambulatory Visit (HOSPITAL_COMMUNITY): Payer: Managed Care, Other (non HMO) | Admitting: Marriage and Family Therapist

## 2012-11-04 ENCOUNTER — Ambulatory Visit (HOSPITAL_COMMUNITY): Payer: Self-pay | Admitting: Marriage and Family Therapist

## 2012-11-08 ENCOUNTER — Ambulatory Visit (INDEPENDENT_AMBULATORY_CARE_PROVIDER_SITE_OTHER): Payer: Managed Care, Other (non HMO) | Admitting: Emergency Medicine

## 2012-11-08 VITALS — BP 144/84 | HR 81 | Temp 98.2°F | Resp 18 | Wt 216.0 lb

## 2012-11-08 DIAGNOSIS — R05 Cough: Secondary | ICD-10-CM

## 2012-11-08 DIAGNOSIS — J111 Influenza due to unidentified influenza virus with other respiratory manifestations: Secondary | ICD-10-CM

## 2012-11-08 LAB — POCT INFLUENZA A/B
Influenza A, POC: NEGATIVE
Influenza B, POC: NEGATIVE

## 2012-11-08 MED ORDER — OSELTAMIVIR PHOSPHATE 75 MG PO CAPS: 75.0000 mg | ORAL_CAPSULE | Freq: Two times a day (BID) | ORAL | Status: DC

## 2012-11-08 MED ORDER — HYDROCOD POLST-CHLORPHEN POLST 10-8 MG/5ML PO LQCR: 5.0000 mL | Freq: Two times a day (BID) | ORAL | Status: DC | PRN

## 2012-11-08 NOTE — Patient Instructions (Signed)

## 2012-11-08 NOTE — Progress Notes (Signed)
[facility] [address], [place] [address] [phone]  Date:  11/08/2012   Name:  Ann Walter   DOB:  07-Mar-1966   MRN:  [id number]  PCP:  Abbe Amsterdam, MD    Chief Complaint: Sinusitis and Sore Throat   History of Present Illness:  Ann Walter is a 47 y.o. very pleasant female patient who presents with the following:  Ill past two days with nasal congestion and drainage mucoid in character.  Has a cough that is mostly non productive.  Occasionally coughs up brown sputum.  No fever or chills.  No wheezing or shortness of breath.  No nausea or vomiting.  No improvement with OTC medication.  Had a flu shot. Sick contact at home.  Patient Active Problem List  Diagnosis  . RUQ PAIN  . ABDOMINAL PAIN, PERIUMBILICAL  . URINALYSIS, ABNORMAL  . Periumbilical pain  . Depression  . HTN (hypertension)  . Benign hematuria  . Plantar fasciitis    Past Medical History  Diagnosis Date  . Obesity   . UTI (urinary tract infection), uncomplicated   . Depression   . HTN (hypertension)   . Benign hematuria 12/2009    WORKUP WITH DR. NESI WAS NEGATIVE  . Heart murmur   . Anxiety   . Allergy     Past Surgical History  Procedure Date  . Cholecystectomy 10/2010  . Cystectomy     x 2 from chest    History  Substance Use Topics  . Smoking status: Never Smoker   . Smokeless tobacco: Never Used  . Alcohol Use: No    Family History  Problem Relation Age of Onset  . Diabetes Mother   . Heart disease Mother     HAD TRIPLE BYPASS  . Hypertension Mother   . Kidney disease Father   . Breast cancer Sister   . Breast cancer Maternal Grandmother   . Cancer Maternal Grandmother     LYMPH NODE CANCER  . Colon cancer Neg Hx     Allergies  Allergen Reactions  . Wellbutrin (Bupropion)     Throat swelling   . Sulfonamide Derivatives     Medication list has been reviewed and updated.  Current Outpatient Prescriptions on File Prior to  Visit  Medication Sig Dispense Refill  . ALPRAZolam (XANAX) 0.5 MG tablet Take 1 tablet (0.5 mg total) by mouth 3 (three) times daily as needed.  90 tablet  2  . lamoTRIgine (LAMICTAL) 100 MG tablet Take 1 tablet (100 mg total) by mouth daily.  30 tablet  0  . lisinopril (PRINIVIL,ZESTRIL) 20 MG tablet Take 2 tablets (40 mg total) by mouth daily.  180 tablet  3  . norethindrone (ORTHO MICRONOR) 0.35 MG tablet Take 1 tablet (0.35 mg total) by mouth daily.  3 Package  3  . ranitidine (ZANTAC) 75 MG tablet Take 75 mg by mouth 2 (two) times daily. Prn       . simethicone (MYLICON) 125 MG chewable tablet Chew 125 mg by mouth every 6 (six) hours as needed.        . [DISCONTINUED] norethindrone-ethinyl estradiol (JUNEL FE,GILDESS FE,LOESTRIN FE) 1-20 MG-MCG tablet Take 1 tablet by mouth daily.  1 Package  12    Review of Systems:  As per HPI, otherwise negative.    Physical Examination: Filed Vitals:   11/08/12 1331  BP: 144/84  Pulse: 81  Temp: 98.2 F (36.8 C)  Resp: 18   Filed Vitals:  11/08/12 1331  Weight: 216 lb (97.977 kg)   There is no height on file to calculate BMI. Ideal Body Weight:    GEN: WDWN, NAD, Non-toxic, A & O x 3 HEENT: Atraumatic, Normocephalic. Neck supple. No masses, No LAD. Ears and Nose: No external deformity. CV: RRR, No M/G/R. No JVD. No thrill. No extra heart sounds. PULM: CTA B, no wheezes, crackles, rhonchi. No retractions. No resp. distress. No accessory muscle use. ABD: S, NT, ND, +BS. No rebound. No HSM. EXTR: No c/c/e NEURO Normal gait.  PSYCH: Normally interactive. Conversant. Not depressed or anxious appearing.  Calm demeanor.    Assessment and Plan: Influenza tamiflu tussionex Follow up as needed  Carmelina Dane, MD  Results for orders placed in visit on 11/08/12  POCT INFLUENZA A/B      Component Value Range   Influenza A, POC Negative     Influenza B, POC Negative

## 2012-11-09 ENCOUNTER — Ambulatory Visit (HOSPITAL_COMMUNITY): Payer: Self-pay | Admitting: Marriage and Family Therapist

## 2012-11-15 ENCOUNTER — Encounter (HOSPITAL_COMMUNITY): Payer: Self-pay | Admitting: Psychiatry

## 2012-11-15 ENCOUNTER — Ambulatory Visit (INDEPENDENT_AMBULATORY_CARE_PROVIDER_SITE_OTHER): Payer: Managed Care, Other (non HMO) | Admitting: Psychiatry

## 2012-11-15 DIAGNOSIS — F39 Unspecified mood [affective] disorder: Secondary | ICD-10-CM

## 2012-11-15 MED ORDER — LAMOTRIGINE 100 MG PO TABS: 100.0000 mg | ORAL_TABLET | Freq: Every day | ORAL | Status: DC
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
## 2012-11-15 NOTE — Addendum Note (Signed)
Addended by: Kathryne Sharper T on: 11/15/2012 04:06 PM   Modules accepted: Orders

## 2012-11-18 ENCOUNTER — Ambulatory Visit (INDEPENDENT_AMBULATORY_CARE_PROVIDER_SITE_OTHER): Payer: Managed Care, Other (non HMO) | Admitting: Marriage and Family Therapist

## 2012-11-18 DIAGNOSIS — F0630 Mood disorder due to known physiological condition, unspecified: Secondary | ICD-10-CM

## 2012-11-18 DIAGNOSIS — F411 Generalized anxiety disorder: Secondary | ICD-10-CM

## 2012-11-18 NOTE — Progress Notes (Signed)
   THERAPIST PROGRESS NOTE  Session Time:  2:00 - 3:00 p.m.  Participation Level: Active  Behavioral Response: CasualAlertAnxious (mild)  Type of Therapy: Individual Therapy  Treatment Goals addressed: Coping  Interventions: Strength-based, Supportive and Family Systems  Summary: Ann Walter is a 47 y.o. female who presents with mood disorder and anxiety.  She was referred by Dr.Arfeen.   Patient she has not been in because she has been sick.  She reports she has been doing better with her depression, particularly around her fear of death and losing a family member.  She reports she tells herself "if it's going to happen it's going to happen."  Patient states she did have a panic attack over watching a show where someone died on TV but other than that she has not had any panic attacks.  She reports taking a Xanax and going to bed to calm herself.  Patient states her relationship with her husband is good however she continues to have a strained relationship with her step-daughter.  Patient states she definitely is doing better with mood and anxiety since she started therapy.  Suicidal/Homicidal: No  Therapist Response:  Assessed patient for anxiety and depression and how she is coping with death.  Patient does appear improved evidenced by self-reports and discussions regarding how she is coping.  She continues to use her animals as a comfort and considers this her therapy.  Discussed her relationship with her stepdaughter.  Discussed husband being the primary caretaker of the SD and how he needs to be the enforcer, consistently, of the rules.  Patient did say he is improving in his parenting because patient told him he needed to step up.  Patient does continue to struggle with this relationship blaming the SD for her e.g,. Laziness.  Will continue to help patient develop more empathy for her SD and shift the responsibility to her husband as caregiver.  Plan: Return again in 1  weeks.  Diagnosis: Axis I: Mood d/o NOS; GAD    Axis II: Deferred    Anijah Spohr, LMFT, CTS 11/18/2012

## 2012-11-23 ENCOUNTER — Ambulatory Visit (HOSPITAL_COMMUNITY): Payer: Self-pay | Admitting: Marriage and Family Therapist

## 2012-11-23 NOTE — Progress Notes (Unsigned)
   THERAPIST PROGRESS NOTE  Session Time:  3:00 - 4:00 p.m.  Participation Level: {BHH PARTICIPATION LEVEL:22264}  Behavioral Response: {Appearance:22683}{BHH LEVEL OF CONSCIOUSNESS:22305}{BHH MOOD:22306}  Type of Therapy: Individual Therapy  Treatment Goals addressed: Coping  Interventions: {CHL AMB BH Type of Intervention:21022753}  Summary: Ann Walter is a 47 y.o. female who presents with mood and anxiety.  She was referred by Dr. Lolly Mustache.   Suicidal/Homicidal: {BHH YES OR NO:22294}{yes/no/with/without intent/plan:22693}  Therapist Response: ***  Plan: Return again in 1 weeks.  Diagnosis: Axis I: Mood d/o NOS; GAD    Axis II: Deferred    Ann Cerro, LMFT, CTS 11/23/2012

## 2012-12-02 ENCOUNTER — Ambulatory Visit (INDEPENDENT_AMBULATORY_CARE_PROVIDER_SITE_OTHER): Payer: Managed Care, Other (non HMO) | Admitting: Family Medicine

## 2012-12-02 VITALS — BP 146/86 | HR 76 | Temp 97.8°F | Resp 20 | Ht 63.0 in | Wt 208.0 lb

## 2012-12-02 DIAGNOSIS — M5380 Other specified dorsopathies, site unspecified: Secondary | ICD-10-CM

## 2012-12-02 DIAGNOSIS — M62830 Muscle spasm of back: Secondary | ICD-10-CM

## 2012-12-02 DIAGNOSIS — M545 Low back pain: Secondary | ICD-10-CM

## 2012-12-02 MED ORDER — CYCLOBENZAPRINE HCL 10 MG PO TABS: 10.0000 mg | ORAL_TABLET | Freq: Three times a day (TID) | ORAL | Status: DC | PRN

## 2012-12-02 MED ORDER — HYDROCODONE-ACETAMINOPHEN 5-325 MG PO TABS: 1.0000 | ORAL_TABLET | Freq: Three times a day (TID) | ORAL | Status: DC | PRN

## 2012-12-02 NOTE — Progress Notes (Signed)
Patient ID: Ann Walter, female   DOB: 09-22-1966, 47 y.o.   MRN: [id number] Ann Walter is a 47 y.o. female who presents to Urgent Care today for back pain:  1.  Back pain:  Describes aching pain in BL lumbar region of back, worse when standing prolonged periods of time.  Not worse on left versus right side. Pain is 8-9 / 10, only minimally relieved with Alleve.  Has Tramadol at home from previous injury but this not helping.  Does little to no regular exercise.  Has had increased stresses at home and at work.  No injuries to her back/no trauma/no inciting factors.    No numbness or paresthesias to bilateral lower extremities.  No LE weakness or changes in gait.  No fevers or chills.  No incontinence of bladder or bowel.     PMH reviewed. Patient is nonsmoker.  ROS as above otherwise neg.  No chest pain, palpitations, SOB, Fever, Chills, Abd pain, N/V/D.  Medications reviewed. Current Outpatient Prescriptions  Medication Sig Dispense Refill  . ALPRAZolam (XANAX) 0.5 MG tablet Take 1 tablet (0.5 mg total) by mouth 3 (three) times daily as needed.  90 tablet  2  . chlorpheniramine-HYDROcodone (TUSSIONEX PENNKINETIC ER) 10-8 MG/5ML LQCR Take 5 mLs by mouth every 12 (twelve) hours as needed (cough).  60 mL  0  . lamoTRIgine (LAMICTAL) 100 MG tablet Take 1 tablet (100 mg total) by mouth daily.  90 tablet  0  . lisinopril (PRINIVIL,ZESTRIL) 20 MG tablet Take 2 tablets (40 mg total) by mouth daily.  180 tablet  3  . norethindrone (ORTHO MICRONOR) 0.35 MG tablet Take 1 tablet (0.35 mg total) by mouth daily.  3 Package  3  . ranitidine (ZANTAC) 75 MG tablet Take 75 mg by mouth 2 (two) times daily. Prn       . simethicone (MYLICON) 125 MG chewable tablet Chew 125 mg by mouth every 6 (six) hours as needed.        . [DISCONTINUED] norethindrone-ethinyl estradiol (JUNEL FE,GILDESS FE,LOESTRIN FE) 1-20 MG-MCG tablet Take 1 tablet by mouth daily.  1 Package  12    Exam:  BP 146/86  Pulse 76   Temp 97.8 F (36.6 C) (Oral)  Resp 20  Ht 5\' 3"  (1.6 m)  Wt 208 lb (94.348 kg)  BMI 36.85 kg/m2  SpO2 100% Gen: Well NAD Back:  Normal skin, Spine with normal alignment and no deformity.  No tenderness to vertebral process palpation.  Paraspinous muscles are tender and with palpable spasm.   Range of motion is full at neck but decreased lumbar sacral regions forward flexion.  Straight leg raise is negative for back pain BL.  Neuro:  Sensation and motor function 5/5 bilateral lower extremities.  Patellar and Achilles  DTR's +2 patellar BL   Assessment and Plan:  1.  Back pain:   - Lumbar muscle spasm without inciting trigger - Flexeril at night  - Heat and massage - Red flags provided  - Norco as home tramadol did not provide much relief. - FU 10 - 14 days if no improvement, sooner if worsening.

## 2012-12-02 NOTE — Assessment & Plan Note (Signed)
Typical back spasm.  Alleve as anti-inflammatory.  Norco for severe pain.  Flexeril as muscle relaxer. See instructions.   FU 10 - 14 days if no improvement, sooner if worsening.

## 2012-12-02 NOTE — Patient Instructions (Addendum)
Use the muscle relaxer at night to help with relaxation.  This will make you sleepy, so do not drive with this.    Take the Norco when you have severe pain.  Do not drive with this.  Use the Alleve 2-3 times a day as an anti-inflammatory.    If you have any worsening of your back pain, trouble walking or other concerns please come back to see [place] or go to the ER.

## 2012-12-03 NOTE — Progress Notes (Signed)
I have read, reviewed, and agree with plan of care by Dr. Walden  

## 2012-12-26 ENCOUNTER — Other Ambulatory Visit: Payer: Self-pay | Admitting: Family Medicine

## 2012-12-26 NOTE — Telephone Encounter (Signed)
Needs office visit with labs

## 2012-12-27 ENCOUNTER — Other Ambulatory Visit: Payer: Self-pay

## 2012-12-27 DIAGNOSIS — Z1231 Encounter for screening mammogram for malignant neoplasm of breast: Secondary | ICD-10-CM

## 2012-12-28 ENCOUNTER — Ambulatory Visit (INDEPENDENT_AMBULATORY_CARE_PROVIDER_SITE_OTHER): Payer: Managed Care, Other (non HMO) | Admitting: Marriage and Family Therapist

## 2012-12-28 DIAGNOSIS — F411 Generalized anxiety disorder: Secondary | ICD-10-CM

## 2012-12-28 DIAGNOSIS — F0630 Mood disorder due to known physiological condition, unspecified: Secondary | ICD-10-CM
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
## 2013-01-04 ENCOUNTER — Other Ambulatory Visit: Payer: Self-pay | Admitting: Family Medicine

## 2013-01-04 NOTE — Telephone Encounter (Signed)
Called in today.  reviwed her psychiatry note from 10/2012, at which time she was doing well,  Will give one month of xanax at this time

## 2013-01-07 ENCOUNTER — Encounter: Payer: Self-pay | Admitting: Women's Health

## 2013-01-07 ENCOUNTER — Ambulatory Visit: Payer: Managed Care, Other (non HMO) | Admitting: Women's Health

## 2013-01-07 VITALS — BP 118/74 | Ht 63.0 in | Wt 211.0 lb

## 2013-01-07 DIAGNOSIS — E079 Disorder of thyroid, unspecified: Secondary | ICD-10-CM

## 2013-01-07 DIAGNOSIS — Z01419 Encounter for gynecological examination (general) (routine) without abnormal findings: Secondary | ICD-10-CM

## 2013-01-07 DIAGNOSIS — IMO0001 Reserved for inherently not codable concepts without codable children: Secondary | ICD-10-CM

## 2013-01-07 LAB — CBC WITH DIFFERENTIAL/PLATELET
Basophils Absolute: 0 10*3/uL (ref 0.0–0.1)
Eosinophils Relative: 6 % — ABNORMAL HIGH (ref 0–5)
HCT: 37 % (ref 36.0–46.0)
Hemoglobin: 12.7 g/dL (ref 12.0–15.0)
Lymphocytes Relative: 38 % (ref 12–46)
Lymphs Abs: 2.8 10*3/uL (ref 0.7–4.0)
MCV: 86.9 fL (ref 78.0–100.0)
Monocytes Absolute: 0.6 10*3/uL (ref 0.1–1.0)
Monocytes Relative: 8 % (ref 3–12)
Neutro Abs: 3.6 10*3/uL (ref 1.7–7.7)
RBC: 4.26 MIL/uL (ref 3.87–5.11)
WBC: 7.4 10*3/uL (ref 4.0–10.5)

## 2013-01-07 MED ORDER — NORETHINDRONE 0.35 MG PO TABS: 1.0000 | ORAL_TABLET | Freq: Every day | ORAL | Status: DC

## 2013-01-07 NOTE — Patient Instructions (Addendum)

## 2013-01-07 NOTE — Progress Notes (Signed)
NO MENSES DUE TO BIRTH CONTROL PILLS
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
## 2013-01-08 LAB — URINALYSIS W MICROSCOPIC + REFLEX CULTURE
Bilirubin Urine: NEGATIVE
Crystals: NONE SEEN
Glucose, UA: NEGATIVE mg/dL
Leukocytes, UA: NEGATIVE
Protein, ur: NEGATIVE mg/dL
Specific Gravity, Urine: 1.01 (ref 1.005–1.030)
Squamous Epithelial / LPF: NONE SEEN
pH: 7 (ref 5.0–8.0)

## 2013-01-11 ENCOUNTER — Encounter: Payer: Self-pay | Admitting: Women's Health

## 2013-01-13 ENCOUNTER — Ambulatory Visit (INDEPENDENT_AMBULATORY_CARE_PROVIDER_SITE_OTHER): Payer: Managed Care, Other (non HMO) | Admitting: Psychiatry

## 2013-01-13 ENCOUNTER — Encounter (HOSPITAL_COMMUNITY): Payer: Self-pay | Admitting: Psychiatry

## 2013-01-13 VITALS — Wt 215.0 lb

## 2013-01-13 DIAGNOSIS — F39 Unspecified mood [affective] disorder: Secondary | ICD-10-CM

## 2013-01-13 MED ORDER — LAMOTRIGINE 150 MG PO TABS: 150.0000 mg | ORAL_TABLET | Freq: Every day | ORAL | Status: DC

## 2013-01-13 NOTE — Progress Notes (Signed)
Patient ID: Ann Walter, female   DOB: 31-Mar-1966, 47 y.o.   MRN: [id number]  [facility] [address] Progress Note  Ann Walter [id number] 47 y.o.  01/13/2013 4:19 PM  Chief Complaint: I started to have mood swings again.    History of Present Illness:  Patient is 47 year old Caucasian married employed female who came for her followup appointment.  She has noticed increased mood swing anger and irritability.  She remains very emotional however she's compliant with her Lamictal.  She is wondering of the Lamictal dose can be further increased.  She has given Xanax from his primary care physician Harriett Sine has noticed that she is taking more Xanax than usual.  She's not drinking or using any illegal substance.  She denies any paranoia or any hallucination.  She is seeing therapist.  Recently she had back pain and given narcotic pain medication but she finished.  She still has back pain sometimes.  She denies any recent hallucination or any paranoia.  She admitted not involve in her daily activity and has gained weight from her last visit.  Suicidal Ideation: No Plan Formed: No Patient has means to carry out plan: No  Homicidal Ideation: No Plan Formed: No Patient has means to carry out plan: No  Review of Systems: Psychiatric: Agitation: Yes Hallucination: No Depressed Mood: No Insomnia: Yes Hypersomnia: No Altered Concentration: No Feels Worthless: No Grandiose Ideas: No Belief In Special Powers: No New/Increased Substance Abuse: No Compulsions: No  Neurologic: Headache: Yes Seizure: No Paresthesias: No  Past Psychiatric History; patient denies any history of suicidal attempt or any history of psychiatric inpatient treatment however 3 weeks ago when she deceive a call from insurance company that they will not pay the money she become very upset and frustrated.  She pulled a gun and called shortest person that she will kill herself.  Paramedics came and she was  transported to local mental health for evaluation however she was discharged patient was not suicidal.  Patient admitted history of poor impulse control and anger.  In the past he had tried Lexapro and Wellbutrin which actually makes her more irritable and angry.  Patient admitted history of anger issues but denies any paranoia psychosis or any hallucination.  Medical History; patient has a history of abdominal pain, hypertension, benign hematuria and cholecystectomy.  Her primary care physician is Dr. Warner Mccreedy.  Patient is scheduled to have an old physical checkup and blood work in January.  Family and Social History: Patient endorse father has history of emotional issues.  She was born and raised in [place].  She grew up in a good environment.  Patient denies any history of physical sexual verbal or emotional abuse.  She has been married once.  She has no children.  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  Outpatient Encounter Prescriptions as of 01/13/2013  Medication Sig Dispense Refill  . ALPRAZolam (XANAX) 0.5 MG tablet TAKE 1 TABLET BY MOUTH 3 TIMES A DAY AS NEEDED  90 tablet  0  . lamoTRIgine (LAMICTAL) 150 MG tablet Take 1 tablet (150 mg total) by mouth daily.  90 tablet  0  . lisinopril (PRINIVIL,ZESTRIL) 20 MG tablet TAKE 2 TABLETS BY MOUTH DAILY  180 tablet  0  . norethindrone (ORTHO MICRONOR) 0.35 MG tablet Take 1 tablet (0.35 mg total) by mouth daily.  3 Package  4  . ranitidine (ZANTAC) 75 MG tablet Take 75 mg by mouth 2 (two) times daily. Prn       . [  DISCONTINUED] lamoTRIgine (LAMICTAL) 100 MG tablet Take 1 tablet (100 mg total) by mouth daily.  90 tablet  0  . [DISCONTINUED] chlorpheniramine-HYDROcodone (TUSSIONEX PENNKINETIC ER) 10-8 MG/5ML LQCR Take 5 mLs by mouth every 12 (twelve) hours as needed (cough).  60 mL  0  . [DISCONTINUED] cyclobenzaprine (FLEXERIL) 10 MG tablet Take 1 tablet (10 mg total) by mouth 3 (three) times daily as needed for  muscle spasms.  30 tablet  0  . [DISCONTINUED] HYDROcodone-acetaminophen (NORCO) 5-325 MG per tablet Take 1 tablet by mouth every 8 (eight) hours as needed for pain.  20 tablet  0  . [DISCONTINUED] simethicone (MYLICON) 125 MG chewable tablet Chew 125 mg by mouth every 6 (six) hours as needed.         No facility-administered encounter medications on file as of 01/13/2013.    Past Psychiatric History/Hospitalization(s): Anxiety: Yes Bipolar Disorder: Yes Depression: Yes Mania: No Psychosis: No Schizophrenia: No Personality Disorder: No Hospitalization for psychiatric illness: No History of Electroconvulsive Shock Therapy: No Prior Suicide Attempts: No  Physical Exam: Constitutional:  Wt 215 lb (97.523 kg)  BMI 38.09 kg/m2  LMP 12/26/2012  Musculoskeletal: Strength & Muscle Tone: within normal limits Gait & Station: normal Patient leans: N/A  Mental Status Examination; patient is casually dressed and fairly groomed.  She is obese.  She maintained fair eye contact.  She's emotional and tearful.  She described her mood is nervous and anxious .  Her affect is mood appropriate.  Her speech is clear and coherent.  Her thought processes logical.  Her fund of knowledge is adequate.  She denies any active or passive suicidal thoughts or homicidal thoughts.  She denies any auditory or visual hallucination.  Her fund of knowledge is adequate.  There were no psychotic symptoms present at this time.  There were no obsession delusion present.  There were no tremors or shakes present.  Her attention concentration is fair.  She's alert and oriented x3 her insight judgment and impulse control is okay.   Medical Decision Making (Choose Three): Review of Psycho-Social Stressors (1), Review and summation of old records (2), Established Problem, Worsening (2), Review of Last Therapy Session (1), Review of Medication Regimen & Side Effects (2) and Review of New Medication or Change in Dosage  (2)  Assessment: Axis I: Mood disorder NOS, rule out bipolar disorder  Axis II: Deferred  Axis III:  Patient Active Problem List  Diagnosis  . Depression  . HTN (hypertension)    Axis IV: Mild to moderate  Axis V: 65-70   Plan: I will increase Lamictal to 150 mg.  She does not have any itching or rash with Lamictal.  I recommend to take Xanax as prescribed by primary care physician.  I recommend not to take more Xanax than usual.  Recommend to call us back if she is any question or concern if she feels worsening of the symptom.  Recommend to watch her calorie intake and do regular exercise.  I will see her again in 3 months.  Time spent 25 minutes.  More than 50% of the time spent and psychoeducation counseling and portion of care. Asuzena Weis T., MD 01/13/2013

## 2013-01-14 ENCOUNTER — Other Ambulatory Visit: Payer: Self-pay | Admitting: Women's Health

## 2013-01-25 ENCOUNTER — Ambulatory Visit (INDEPENDENT_AMBULATORY_CARE_PROVIDER_SITE_OTHER): Payer: Managed Care, Other (non HMO) | Admitting: Marriage and Family Therapist

## 2013-01-25 DIAGNOSIS — F411 Generalized anxiety disorder: Secondary | ICD-10-CM

## 2013-01-25 DIAGNOSIS — F0630 Mood disorder due to known physiological condition, unspecified: Secondary | ICD-10-CM

## 2013-01-25 NOTE — Progress Notes (Signed)
   THERAPIST PROGRESS NOTE  Session Time: 2:00 - 3:00 p.m.  Participation Level: Active  Behavioral Response: CasualAlertDepressed  Type of Therapy: Individual Therapy  Treatment Goals addressed: Coping  Interventions: Strength-based and Supportive  Summary: Ann Walter is a 47 y.o. female who presents with depression and anxiety.  She was referred by Dr. Lolly Mustache.  Patient reports she has been struggling with being depressed.  She reports that yesterday she "thought about taking pills" and actually looked up on the Internet what to take.  She reports it was because of conflict in the family, particularly relating to her aunt.  She also reports there is stress at work although less so than at home.  She reports not feeling that her husband knows how to cope with her when she "needs to vent."  Patient also states that she saw Dr. Lolly Mustache in the past two weeks and her depression medication was increased due to her depression.    Suicidal/Homicidal: Yeswithout intent/plan  Therapist Response:  Assessed patient's suicidal ideation.  As of today patient did not want to kill herself.  She did have a plan as of yesterday.  Contracted for safety and patient states she will not hurt herself.  Made a plan on how patient can begin to take care of herself, replenish herself emotionally.  Patient will do the following:  Start walking with her work group.  Spend time with her donkeys.  Stay away from toxic people as much as possible.  Spend time with her best friend who is supportive of her.  Will talk to her best friend to help patient come up with additional ways patient can take care of herself.  Will continue to assess for suicide and discuss progress with Dr. Lolly Mustache.  Plan: Return again in  1 weeks.  Diagnosis: Axis I: Mood d/o NOS; GAD    Axis II: Deferred    Roise Emert, LMFT 01/25/2013

## 2013-02-02 ENCOUNTER — Other Ambulatory Visit: Payer: Self-pay | Admitting: Family Medicine

## 2013-02-03 ENCOUNTER — Telehealth (HOSPITAL_COMMUNITY): Payer: Self-pay

## 2013-02-10 ENCOUNTER — Ambulatory Visit (INDEPENDENT_AMBULATORY_CARE_PROVIDER_SITE_OTHER): Payer: Managed Care, Other (non HMO) | Admitting: Marriage and Family Therapist

## 2013-02-10 DIAGNOSIS — F411 Generalized anxiety disorder: Secondary | ICD-10-CM

## 2013-02-10 DIAGNOSIS — F0630 Mood disorder due to known physiological condition, unspecified: Secondary | ICD-10-CM

## 2013-02-10 NOTE — Progress Notes (Signed)
   THERAPIST PROGRESS NOTE  Session Time: 2:00 - 3:00 p.m.  Participation Level: Active  Behavioral Response: CasualAlertAnxious and Depressed    Type of Therapy: Individual Therapy  Treatment Goals addressed: Coping  Interventions: Strength-based and Supportive  Summary: Ann Walter is a 47 y.o. female who presents with depression and anxiety.  He was referred by Dr. Lolly Mustache.  Patient reports she is feeling better than last session.  She reports she is not feeling suicidal and actually started feeling better after last session.  She reports believing part of the reason is because her stepdaughter will be staying with her mother over the Summer and she and her husband have been making plans i.e., their anniversary is in June and they are trying to plan events around the anniversary.  Patient states she has been staying away from her aunt as well as a way to cope with conflict there.  She also states with her husband she "no longer keeps things to herself" and tells him when she is upset or needs something.  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  Suicidal/Homicidal: No  Therapist Response:  Assessed for suicide and patient is not having any suicidal thoughts for the last two weeks.  Patient also rated depression over the past two weeks about a 4 and anxiety she states is even less. Discussed the ways patient is using to cope with her depression and anxiety.  Encouraged patient to continue to use the skills since they seem to be working for her.   Plan: Return again in 2 week.  Diagnosis: Axis I: GAD; Mood d/o NOS    Axis II: Deferred    Ann Premo, LMFT, CTS 02/10/2013

## 2013-02-11 ENCOUNTER — Ambulatory Visit
Admission: RE | Admit: 2013-02-11 | Discharge: 2013-02-11 | Disposition: A | Discharge status: Home / Self Care | Payer: Private Health Insurance - Indemnity | Source: Ambulatory Visit

## 2013-02-11 DIAGNOSIS — Z1231 Encounter for screening mammogram for malignant neoplasm of breast: Secondary | ICD-10-CM

## 2013-02-23 ENCOUNTER — Ambulatory Visit (INDEPENDENT_AMBULATORY_CARE_PROVIDER_SITE_OTHER): Payer: Private Health Insurance - Indemnity | Admitting: Marriage and Family Therapist

## 2013-02-23 DIAGNOSIS — F331 Major depressive disorder, recurrent, moderate: Secondary | ICD-10-CM

## 2013-02-23 DIAGNOSIS — F411 Generalized anxiety disorder: Secondary | ICD-10-CM
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
## 2013-03-04 ENCOUNTER — Other Ambulatory Visit: Payer: Self-pay | Admitting: Family Medicine

## 2013-03-08 ENCOUNTER — Ambulatory Visit (INDEPENDENT_AMBULATORY_CARE_PROVIDER_SITE_OTHER): Payer: Private Health Insurance - Indemnity | Admitting: Marriage and Family Therapist

## 2013-03-08 DIAGNOSIS — F331 Major depressive disorder, recurrent, moderate: Secondary | ICD-10-CM

## 2013-03-08 DIAGNOSIS — F411 Generalized anxiety disorder: Secondary | ICD-10-CM

## 2013-03-08 NOTE — Progress Notes (Signed)
   THERAPIST PROGRESS NOTE  Session Time: 2:00 - 3:00 p.m.  Participation Level: Active  Behavioral Response: CasualAlertAngry and Anxious  Type of Therapy: Individual Therapy  Treatment Goals addressed: Coping  Interventions: Strength-based, Assertiveness Training and Supportive  Summary: Ann Walter is a 47 y.o. female who presents with depression and anxiety.  She was referred by Dr. Lolly Mustache.  Patient reports she "feels like she is more even" regarding her mood.  She reports coming to therapy helps her.  Patient talked about her frustrations with her husband and stepdaughter and her job.  She talked about her husband wanting patient to talk to him but that patient has difficulty having conversations with others.  She states this is one of his biggest complaints. Patient states she believes it is because in a past relationship she was told what she had to say is not important.  Patient also admits she holds things in and then explodes rather than directly complaining.  She also talked about spending time with her mother during Mother's Day Friday.  She talked about losing her father but mainly about how she took care of him and was present for his death.   Suicidal/Homicidal: Nowithout intent/plan  Therapist Response:  Discussed ways patient can be more direct with her husband about her needs without being aggressive or passive aggressive.  Will discuss more with patient about types of communication.  Assessed depression and through self-report and body language she appears less depressed this session.  She admits not having any suicidal ideation but says it is under the surface when she lets things bottle up.  Commended her for being able to recognize this behavior.  Patient cried throughout the session relating to the thought of losing her mother and the death of her father.  Discussed what an honorable commitment it was and a privilege for patient to be able to be there for her father.   Discussed this writer going on vacation and what patient will do in case of a crisis.  Plan: Return again in 3 weeks.  Diagnosis: Axis I: Major depressive d/o, moderate; GAD    Axis II: Deferred    Raelie Lohr, LMFT, CTS 03/08/2013

## 2013-03-22 ENCOUNTER — Ambulatory Visit (INDEPENDENT_AMBULATORY_CARE_PROVIDER_SITE_OTHER): Payer: Private Health Insurance - Indemnity | Admitting: Psychiatry

## 2013-03-22 ENCOUNTER — Encounter (HOSPITAL_COMMUNITY): Payer: Self-pay | Admitting: Psychiatry

## 2013-03-22 VITALS — BP 138/85 | HR 65 | Ht 63.5 in | Wt 214.0 lb

## 2013-03-22 DIAGNOSIS — F39 Unspecified mood [affective] disorder: Secondary | ICD-10-CM

## 2013-03-22 MED ORDER — RISPERIDONE 0.5 MG PO TABS: 0.5000 mg | ORAL_TABLET | Freq: Every day | ORAL | Status: DC

## 2013-03-22 NOTE — Progress Notes (Signed)
Patient ID: Ann Walter, female   DOB: 09/28/1966, 47 y.o.   MRN: [id number]  [facility] [id number] Progress Note  Ann Walter [id number] 47 y.o.  03/22/2013 4:29 PM  Chief Complaint: I'm under a lot of stress.  I am having issue with her stepdaughter.    History of Present Illness:  Patient is 47 year old Caucasian married employed female who came for her followup appointment.  On her last visit increase Lamictal to 150 mg to help the irritability and anger.  She continued to struggle with issues of irritability anger and mood swing.  She admitted to poor sleep racing thoughts and some time any episodes.  She is having issue with his stepdaughter .  She admitted some time passive suicidal thinking but denies any active suicidal parts or homicidal thoughts.  She also endorsed financial difficulty.  Initially she felt the Lamictal helped however she started to gain feeling agitated and irritable.  She takes Xanax up to 3 times a day and sometimes bad does not help.  She also has multiple health issues including back pain .  She seeing therapist.  She denies any aggression or violence or denies any active or passive suicidal thoughts or homicidal thoughts.  She's not drinking or using any illegal substance.  She remains isolated withdrawn and easily irritable at work.  She is wondering if the medicine can be adjusted.  She denies any paranoia or any hallucination.  Suicidal Ideation: Yes Plan Formed: No Patient has means to carry out plan: No  Homicidal Ideation: No Plan Formed: No Patient has means to carry out plan: No  Review of Systems: Psychiatric: Agitation: Yes Hallucination: No Depressed Mood: Yes Insomnia: Yes Hypersomnia: No Altered Concentration: No Feels Worthless: No Grandiose Ideas: No Belief In Special Powers: No New/Increased Substance Abuse: No Compulsions: No  Neurologic: Headache: Yes Seizure: No Paresthesias: No  Past Psychiatric History;   Patient denies any history of suicidal attempt or any history of psychiatric inpatient treatment however last year when she deceive a call from insurance company that they will not pay the money she become very upset and frustrated.  She pulled a gun and threatened that she will kill herself.  Paramedics came and she was transported to local mental health for evaluation however she was discharged patient was not suicidal.  Patient admitted history of poor impulse control and anger.  In the past he had tried Lexapro, Paxil, Prozac Zoloft and Wellbutrin.  She remembered Wellbutrin makes her more irritable and angry.  Patient admitted history of anger issues but denies any paranoia psychosis or any hallucination.   Medical History;  Patient has a history of abdominal pain, hypertension, benign hematuria and cholecystectomy.  Her primary care physician is Dr. Warner Mccreedy.  Patient is scheduled to have an old physical checkup and blood work in January.  Family and Social History:  Patient endorse father has history of emotional issues.  She was born and raised in [place].  She grew up in a good environment.  Patient denies any history of physical sexual verbal or emotional abuse.  She has been married once.  She has no children.  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  Outpatient Encounter Prescriptions as of 03/22/2013  Medication Sig Dispense Refill  . ALPRAZolam (XANAX) 0.5 MG tablet TAKE 1 TABLET THREE TIMES A DAY AS NEEDED  90 tablet  2  . lamoTRIgine (LAMICTAL) 150 MG tablet Take 1 tablet (150 mg total) by mouth daily.  90 tablet  0  . lisinopril (PRINIVIL,ZESTRIL) 20 MG tablet TAKE 2 TABLETS BY MOUTH DAILY  180 tablet  0  . norethindrone (MICRONOR,CAMILA,ERRIN) 0.35 MG tablet TAKE 1 TABLET EVERY DAY  84 tablet  3  . norethindrone (ORTHO MICRONOR) 0.35 MG tablet Take 1 tablet (0.35 mg total) by mouth daily.  3 Package  4  . ranitidine (ZANTAC) 75 MG tablet Take 75 mg by  mouth 2 (two) times daily. Prn       . BESIVANCE 0.6 % SUSP       . risperiDONE (RISPERDAL) 0.5 MG tablet Take 1 tablet (0.5 mg total) by mouth at bedtime.  30 tablet  0   No facility-administered encounter medications on file as of 03/22/2013.    Past Psychiatric History/Hospitalization(s): Anxiety: Yes Bipolar Disorder: Yes Depression: Yes Mania: No Psychosis: No Schizophrenia: No Personality Disorder: No Hospitalization for psychiatric illness: No History of Electroconvulsive Shock Therapy: No Prior Suicide Attempts: No  Physical Exam: Constitutional:  BP 138/85  Pulse 65  Ht 5' 3.5" (1.613 m)  Wt 214 lb (97.07 kg)  BMI 37.31 kg/m2  Musculoskeletal: Strength & Muscle Tone: within normal limits Gait & Station: normal Patient leans: N/A  Mental Status Examination;  Patient is casually dressed and fairly groomed.  She is obese.  She maintained fair eye contact.  She's emotional and tearful.  She described her mood is nervous and anxious .  Her affect is mood appropriate.  Her speech is clear and coherent.  Her thought processes logical.  Her fund of knowledge is adequate.  She endorse passive suicidal thinking denies any active suicidal thoughts or plan.  She denies any homicidal thoughts.  She denies any auditory or visual hallucination.  Her fund of knowledge is adequate.  There were no psychotic symptoms present at this time.  There were no obsession delusion present.  There were no tremors or shakes present.  Her attention concentration is fair.  She's alert and oriented x3 her insight judgment and impulse control is okay.   Medical Decision Making (Choose Three): Review of Psycho-Social Stressors (1), Review and summation of old records (2), Established Problem, Worsening (2), Review of Last Therapy Session (1), Review of Medication Regimen & Side Effects (2) and Review of New Medication or Change in Dosage (2)  Assessment: Axis I: Mood disorder NOS, rule out bipolar  disorder  Axis II: Deferred  Axis III:  Patient Active Problem List   Diagnosis Date Noted  . Depression   . HTN (hypertension)     Axis IV: Mild to moderate  Axis V: 65-70   Plan:  I will add Risperdal 0.5 mg at bedtime.  She will continue Lamictal 150 mg daily.  In the past she had tried SSRIs but she had poor response.  She also tried Wellbutrin however her agitation get worse.  She continued take Xanax from her primary care physician as needed.  Recommend to call us back if she is any question or concern if she feels worsening of the symptom.  I will see her again in 4 weeks.  Recommend to see therapist regularly for coping and social skills. Time spent 25 minutes.  More than 50% of the time spent and psychoeducation counseling and portion of care. ARFEEN,SYED T., MD 03/22/2013

## 2013-03-30 ENCOUNTER — Ambulatory Visit (INDEPENDENT_AMBULATORY_CARE_PROVIDER_SITE_OTHER): Payer: Private Health Insurance - Indemnity | Admitting: Marriage and Family Therapist

## 2013-03-30 DIAGNOSIS — F411 Generalized anxiety disorder: Secondary | ICD-10-CM

## 2013-03-30 DIAGNOSIS — F331 Major depressive disorder, recurrent, moderate: Secondary | ICD-10-CM

## 2013-03-30 NOTE — Progress Notes (Signed)
   THERAPIST PROGRESS NOTE  Session Time: 3:00 - 4:00 p.m.  Participation Level: Active  Behavioral Response: CasualAlertAnxious and Depressed (both moderate)  Type of Therapy: Individual Therapy  Treatment Goals addressed: Coping  Interventions: Strength-based, Assertiveness Training and Supportive  Summary: Ann Walter is a 47 y.o. female who presents with depression and anxiety.  She was referred by Dr. Lolly Mustache.  Patient reports over the past two weeks believing she is handling how she copes with situations better.  She reports she has been coping with work better based on feeling like she is not going to lose her job and she has not been so angry at home as well, including towards her stepdaughter.  She did say she is having trouble with fatigue and generally not feeling better physically, triggering her fears over dying.  Patient also states she feels guilty over when her mother calls her and she feels frustrated about not wanting to help her mother.  Patient talked about her stepdaughter leaving for the summer and how she and her husband will be spending it doing day trips together.  Patient also mentioned that she has been prescribed Risperdal but that she did not know why.  Suicidal/Homicidal: Nowithout intent/plan  Therapist Response:  Discussed patient doing better regarding a decrease in her depression and anxiety based on patient's agreement of same.  Patient was able to identify external reasons why she was feeling better and did not attribute the change based on her own work.  Discussed how patient comes into therapy and "vents" about how angry she is towards others but does not usually say directly to others how she is feeling.  Discussed the idea that her pent-up anger is causing problems with anxiety and depression and patient agreed.  Discussed ways patient can be assertive around her own needs without being angry.  Also discussed patient's need to be more assertive based  on being overextended at home and at work.  Discussed one specific goal patient can have - to request of her husband that he put his lunch cooler away when he comes home rather than patient needing to take care of it since they both work.  Discussed practice being important because she may have an expectation that husband will start doing this regularly automatically.  Also discussed patient seeing her primary doctor for a physical since she is stating she has been feeling fatigued and not feeling well to calm her concerns about her dying.  Explained to patient that the Risperdal is probably because she has difficulty calming her thoughts (described them as "always spinning around").  Told her to talk to her doctor about the medication.  Plan: Return again in 1 weeks.  Diagnosis: Axis I: Major depressive d/o, moderate; GAD    Axis II: Deferred    Alyra Patty, LMFT, CTS 03/30/2013

## 2013-04-05 ENCOUNTER — Other Ambulatory Visit: Payer: Self-pay | Admitting: Family Medicine

## 2013-04-06 ENCOUNTER — Telehealth: Payer: Self-pay | Admitting: Family Medicine

## 2013-04-06 NOTE — Telephone Encounter (Signed)
Received her refill request for xanax.  She plans to come and see me next week for a recheck and a foot injury.  Denies any plans or desire to hurt self

## 2013-04-07 ENCOUNTER — Ambulatory Visit (INDEPENDENT_AMBULATORY_CARE_PROVIDER_SITE_OTHER): Payer: Managed Care, Other (non HMO) | Admitting: Physician Assistant

## 2013-04-07 VITALS — BP 142/86 | HR 90 | Temp 98.8°F | Resp 20 | Wt 213.4 lb

## 2013-04-07 DIAGNOSIS — M62830 Muscle spasm of back: Secondary | ICD-10-CM

## 2013-04-07 DIAGNOSIS — M5380 Other specified dorsopathies, site unspecified: Secondary | ICD-10-CM

## 2013-04-07 MED ORDER — CYCLOBENZAPRINE HCL 10 MG PO TABS: 10.0000 mg | ORAL_TABLET | Freq: Three times a day (TID) | ORAL | Status: DC | PRN

## 2013-04-07 MED ORDER — KETOROLAC TROMETHAMINE 60 MG/2ML IM SOLN
60.0000 mg | Freq: Once | INTRAMUSCULAR | Status: AC
  Administered 2013-04-07: 60 mg via INTRAMUSCULAR

## 2013-04-07 MED ORDER — HYDROCODONE-ACETAMINOPHEN 5-325 MG PO TABS: 1.0000 | ORAL_TABLET | Freq: Four times a day (QID) | ORAL | Status: DC | PRN

## 2013-04-07 NOTE — Patient Instructions (Addendum)
We have given you an injection of Toradol (anti-inflammatory/pain medicine) in clinic today.  Do not use and additional Advil or Aleve, as these medicines work the same way.  Norco (hydrocodone) every 6 hours if needed for pain.  Flexeril (cyclobenazprine) every 8 hours as needed to help relax the muslces - this will likely make you sleepy.  Use ice or heat if that feels comfortable.  Take it easy today, but do not be completely sedentary.  Be careful with bending and twisting for the next 2-3 days.  If your symptoms are changing at all, especially if you are developing numbness, tingling, weakness, trouble with bowel or bladder - please let us know right away.  If you are symptoms persist, it may be beneficial to do physical therapy to help strengthen and retrain your muscles.   Back Pain, Adult Low back pain is very common. About 1 in 5 people have back pain.The cause of low back pain is rarely dangerous. The pain often gets better over time.About half of people with a sudden onset of back pain feel better in just 2 weeks. About 8 in 10 people feel better by 6 weeks.  CAUSES Some common causes of back pain include:  Strain of the muscles or ligaments supporting the spine.  Wear and tear (degeneration) of the spinal discs.  Arthritis.  Direct injury to the back. DIAGNOSIS Most of the time, the direct cause of low back pain is not known.However, back pain can be treated effectively even when the exact cause of the pain is unknown.Answering your caregiver's questions about your overall health and symptoms is one of the most accurate ways to make sure the cause of your pain is not dangerous. If your caregiver needs more information, he or she may order lab work or imaging tests (X-rays or MRIs).However, even if imaging tests show changes in your back, this usually does not require surgery. HOME CARE INSTRUCTIONS For many people, back pain returns.Since low back pain is rarely dangerous, it is  often a condition that people can learn to [facility] their own.   Remain active. It is stressful on the back to sit or stand in one place. Do not sit, drive, or stand in one place for more than 30 minutes at a time. Take short walks on level surfaces as soon as pain allows.Try to increase the length of time you walk each day.  Do not stay in bed.Resting more than 1 or 2 days can delay your recovery.  Do not avoid exercise or work.Your body is made to move.It is not dangerous to be active, even though your back may hurt.Your back will likely heal faster if you return to being active before your pain is gone.  Pay attention to your body when you bend and lift. Many people have less discomfortwhen lifting if they bend their knees, keep the load close to their bodies,and avoid twisting. Often, the most comfortable positions are those that put less stress on your recovering back.  Find a comfortable position to sleep. Use a firm mattress and lie on your side with your knees slightly bent. If you lie on your back, put a pillow under your knees.  Only take over-the-counter or prescription medicines as directed by your caregiver. Over-the-counter medicines to reduce pain and inflammation are often the most helpful.Your caregiver may prescribe muscle relaxant drugs.These medicines help dull your pain so you can more quickly return to your normal activities and healthy exercise.  Put ice on the  injured area.  Put ice in a plastic bag.  Place a towel between your skin and the bag.  Leave the ice on for 15-20 minutes, 3-4 times a day for the first 2 to 3 days. After that, ice and heat may be alternated to reduce pain and spasms.  Ask your caregiver about trying back exercises and gentle massage. This may be of some benefit.  Avoid feeling anxious or stressed.Stress increases muscle tension and can worsen back pain.It is important to recognize when you are anxious or stressed and learn ways to  manage it.Exercise is a great option. SEEK MEDICAL CARE IF:  You have pain that is not relieved with rest or medicine.  You have pain that does not improve in 1 week.  You have new symptoms.  You are generally not feeling well. SEEK IMMEDIATE MEDICAL CARE IF:   You have pain that radiates from your back into your legs.  You develop new bowel or bladder control problems.  You have unusual weakness or numbness in your arms or legs.  You develop nausea or vomiting.  You develop abdominal pain.  You feel faint. Document Released: 10/13/2005 Document Revised: 04/13/2012 Document Reviewed: 03/03/2011 [facility] Patient Information 2014 [place], [place].

## 2013-04-07 NOTE — Progress Notes (Signed)
Subjective:    Patient ID: Ann Walter, female    DOB: [birth year], 47 y.o.   MRN: [id number]  HPI   Ann Walter is a 47 yr old female here with complaint of back pain after falling last night.  States she tripped over a fence, caught her toe and fell.  She is unsure how she landed but thinks it was on her back.  Denies any other injury.  She had some discomfort last night but pain worsened this morning.  Back pain is left sided only.  States the pain comes and goes in spasms - feels like twisting, stabbing.  Denies having any back pain at baseline, states all new onset.  Denies radiation of pain.  Denies numbness, tingling, weakness, saddle anesthesia.  No loss of bowel or bladder control.  No pain with deep inspiration.  No CP.  Pt has not taken anything for pain yet.  Has not used ice or heat.  She denies fever, chills, abd pain, urinary symptoms.     Review of Systems  Constitutional: Negative for fever and chills.  HENT: Negative.   Respiratory: Negative for cough, chest tightness, shortness of breath and wheezing.   Cardiovascular: Negative.   Gastrointestinal: Negative for nausea, vomiting and abdominal pain.  Genitourinary: Negative for dysuria, urgency, hematuria and flank pain.  Musculoskeletal: Positive for back pain.  Skin: Negative.   Neurological: Negative for weakness and numbness.       Objective:   Physical Exam  Vitals reviewed. Constitutional: She is oriented to person, place, and time. She appears well-developed and well-nourished. No distress.  HENT:  Head: Normocephalic and atraumatic.  Eyes: Conjunctivae are normal. No scleral icterus.  Neck: Normal range of motion and full passive range of motion without pain. No spinous process tenderness and no muscular tenderness present.  Cardiovascular: Normal rate, regular rhythm and normal heart sounds.   Pulmonary/Chest: Effort normal and breath sounds normal. She has no wheezes. She has no rales.  Abdominal: Soft.  There is no tenderness.  Musculoskeletal:       Cervical back: Normal.       Thoracic back: Normal.       Lumbar back: She exhibits decreased range of motion, tenderness, pain and spasm. She exhibits no bony tenderness.       Arms: Palpable spasm left lumbar paraspinals; decreased ROM in all planes - most prominently with flexion and rotation; SLR neg bilat  Neurological: She is alert and oriented to person, place, and time. She has normal strength. No sensory deficit. She displays no Babinski's sign on the right side. She displays no Babinski's sign on the left side.  Reflex Scores:      Patellar reflexes are 2+ on the right side and 2+ on the left side.      Achilles reflexes are 2+ on the right side and 2+ on the left side. Mild antalgic gait due to pain; no extremity weakness; able to walk on tip toes and heels  Skin: Skin is warm and dry.  Psychiatric: She has a normal mood and affect. Her behavior is normal.        Assessment & Plan:  Back spasm - Plan: ketorolac (TORADOL) injection 60 mg, HYDROcodone-acetaminophen (NORCO) 5-325 MG per tablet, cyclobenzaprine (FLEXERIL) 10 MG tablet   Ann Walter is a pleasant 47 yr old female here with back muscle spasm after suffering a fall last evening.  On exam, pt is tender and with palpable spasm over the left lumbar paraspinals.  There is no bony tenderness.  No neuro deficits or weakness.  Toradal 60mg  IM given in clinic with some improvement of symptoms at discharge.  Will start Norco q6h and Flexeril q8h.  Encouraged relative rest today but instructed pt to not be completely sedentary.  Discussed red flags and RTC precautions.  Educational materials provided.  Discussed with pt that I expect improvement within the next 2-3 days but may take 1-2 wks for complete recovery.  Pt understands and is in agreement.

## 2013-04-13 ENCOUNTER — Ambulatory Visit (INDEPENDENT_AMBULATORY_CARE_PROVIDER_SITE_OTHER): Payer: Managed Care, Other (non HMO) | Admitting: Family Medicine

## 2013-04-13 ENCOUNTER — Encounter: Payer: Self-pay | Admitting: Family Medicine

## 2013-04-13 ENCOUNTER — Ambulatory Visit (INDEPENDENT_AMBULATORY_CARE_PROVIDER_SITE_OTHER): Payer: Private Health Insurance - Indemnity | Admitting: Marriage and Family Therapist

## 2013-04-13 ENCOUNTER — Ambulatory Visit: Payer: Managed Care, Other (non HMO)

## 2013-04-13 DIAGNOSIS — M62830 Muscle spasm of back: Secondary | ICD-10-CM

## 2013-04-13 DIAGNOSIS — M5380 Other specified dorsopathies, site unspecified: Secondary | ICD-10-CM

## 2013-04-13 DIAGNOSIS — F331 Major depressive disorder, recurrent, moderate: Secondary | ICD-10-CM

## 2013-04-13 DIAGNOSIS — M549 Dorsalgia, unspecified: Secondary | ICD-10-CM

## 2013-04-13 DIAGNOSIS — M79609 Pain in unspecified limb: Secondary | ICD-10-CM

## 2013-04-13 DIAGNOSIS — F410 Panic disorder [episodic paroxysmal anxiety] without agoraphobia: Secondary | ICD-10-CM

## 2013-04-13 DIAGNOSIS — F329 Major depressive disorder, single episode, unspecified: Secondary | ICD-10-CM

## 2013-04-13 LAB — POCT CBC
Granulocyte percent: 67.9 %G (ref 37–80)
MCV: 95.8 fL (ref 80–97)
MID (cbc): 0.5 (ref 0–0.9)
MPV: 9.7 fL (ref 0–99.8)
POC Granulocyte: 5.4 (ref 2–6.9)
POC MID %: 6.1 %M (ref 0–12)
Platelet Count, POC: 246 10*3/uL (ref 142–424)
RBC: 4.31 M/uL (ref 4.04–5.48)
RDW, POC: 14.2 %

## 2013-04-13 LAB — COMPREHENSIVE METABOLIC PANEL
ALT: 10 U/L (ref 0–35)
AST: 14 U/L (ref 0–37)
Albumin: 4.3 g/dL (ref 3.5–5.2)
Alkaline Phosphatase: 59 U/L (ref 39–117)
Calcium: 9.6 mg/dL (ref 8.4–10.5)
Chloride: 105 mEq/L (ref 96–112)
Potassium: 4.2 mEq/L (ref 3.5–5.3)
Sodium: 135 mEq/L (ref 135–145)
Total Protein: 6.7 g/dL (ref 6.0–8.3)

## 2013-04-13 MED ORDER — CYCLOBENZAPRINE HCL 10 MG PO TABS: 10.0000 mg | ORAL_TABLET | Freq: Three times a day (TID) | ORAL | Status: DC | PRN

## 2013-04-13 NOTE — Progress Notes (Signed)
   THERAPIST PROGRESS NOTE  Session Time:  3:00 - 4:00 p.m.  Participation Level: Active  Behavioral Response: CasualAlertDepressed (mild)  Type of Therapy: Individual Therapy  Treatment Goals addressed: Coping  Interventions: Strength-based and Supportive  Summary: Ann Walter is a 47 y.o. female who presents with depression and anxiety.  She was referred by Dr. Lolly Mustache.  Patient reports having minimal symptoms of depression.  She reports feeling good because her stepdaughter has left for the summer, she and her husband are alone, and "my house stays clean."  Patient states she and her husband have many activities planned together.  She reports that there are to be a lot of layoffs at her job, however, she thought about it and looked at the financial package that is very good, and believes it will be a good opportunity for her if she is let go.  Suicidal/Homicidal: Nowithout intent/plan  Therapist Response:  Assessed patient's depression and patient states it is a 2 on a 0-10 scale.  Talked about most of patient's depression being situational and discussed how she is unable to cope at times with multiple stressors.  Talked about taking this summer to more clearly think about how she wants to handle work, her stepdaughter, husband, etc.  Plan: Return again in 1 weeks.  Diagnosis: Axis I: Major depressive d/o, moderate; GAD    Axis II: Deferred    Ann Zahradnik, LMFT, CTS 04/13/2013

## 2013-04-13 NOTE — Patient Instructions (Addendum)
Try wearing the stiff shoe for a couple of weeks.  If this does not help please give me a call and I will refer you to a podiatrist.    Use the flexeril as needed for your back.  If it does not continue to get better please let me know, Sooner if worse.

## 2013-04-13 NOTE — Progress Notes (Signed)
[facility] [address], [place] [address] [phone]  Date:  04/13/2013   Name:  Ann Walter   DOB:  1966/10/16   MRN:  [id number]  PCP:  Abbe Amsterdam, MD    Chief Complaint: Follow-up   History of Present Illness:  Ann Walter is a 47 y.o. very pleasant female patient who presents with the following:  She fell about a week ago and hurt her back.  She was seen by Ms. Debbra Riding, PA-C and treated with toradol, norco and flexeril for back spasm.  She did have one episode of pain down her leg for about 20 minutes, but this has resolved.  No numbness or bowel/ bladder control problems.  She notes that her back is better but it does sometimes still hurt- ? Does she need more pain medication or a stronger muscle relaxer.  Her back now has more of a constant ache.    Her therapist would like [place] to check some blood work/ TSH.  I will do these labs for her today.    She also has a problem with her right foot- she kicked a donkey several months ago and has pain on and off since then.  She will sometimes note a sharp pain across the medial midfoot that comes and goes.   She had it x-rayed at her husband's chiropractor about 3 months ago and was told it was ok.    LMP was a few months ago- she is on continuous OCP and per her OBG amenorrhea is to be expected.   Last labs were about one year ago.   Emotionally she feels that she is doing ok, and continues to see her counselor.   Denies any current SI   Patient Active Problem List   Diagnosis Date Noted  . Depression   . HTN (hypertension)     Past Medical History  Diagnosis Date  . Obesity   . UTI (urinary tract infection), uncomplicated   . Depression   . HTN (hypertension)   . Benign hematuria 12/2009    WORKUP WITH DR. NESI WAS NEGATIVE  . Heart murmur   . Anxiety   . Allergy     Past Surgical History  Procedure Laterality Date  . Cholecystectomy  10/2010  . Cystectomy      x 2 from  chest    History  Substance Use Topics  . Smoking status: Never Smoker   . Smokeless tobacco: Never Used  . Alcohol Use: No    Family History  Problem Relation Age of Onset  . Diabetes Mother   . Heart disease Mother     HAD TRIPLE BYPASS  . Hypertension Mother   . Kidney disease Father   . Breast cancer Sister   . Breast cancer Maternal Grandmother   . Cancer Maternal Grandmother     LYMPH NODE CANCER  . Colon cancer Neg Hx   . Cancer Maternal Aunt     COLORECTAL     Allergies  Allergen Reactions  . Wellbutrin (Bupropion)     Throat swelling   . Sulfonamide Derivatives     Medication list has been reviewed and updated.  Current Outpatient Prescriptions on File Prior to Visit  Medication Sig Dispense Refill  . ALPRAZolam (XANAX) 0.5 MG tablet TAKE 1 TABLET THREE TIMES A DAY AS NEEDED FOR ANXIETY  30 tablet  0  . BESIVANCE 0.6 % SUSP       . cyclobenzaprine (FLEXERIL) 10  MG tablet Take 1 tablet (10 mg total) by mouth 3 (three) times daily as needed for muscle spasms.  30 tablet  0  . HYDROcodone-acetaminophen (NORCO) 5-325 MG per tablet Take 1 tablet by mouth every 6 (six) hours as needed for pain.  30 tablet  0  . lamoTRIgine (LAMICTAL) 150 MG tablet Take 1 tablet (150 mg total) by mouth daily.  90 tablet  0  . lisinopril (PRINIVIL,ZESTRIL) 20 MG tablet TAKE 2 TABLETS BY MOUTH DAILY  180 tablet  0  . norethindrone (MICRONOR,CAMILA,ERRIN) 0.35 MG tablet TAKE 1 TABLET EVERY DAY  84 tablet  3  . norethindrone (ORTHO MICRONOR) 0.35 MG tablet Take 1 tablet (0.35 mg total) by mouth daily.  3 Package  4  . ranitidine (ZANTAC) 75 MG tablet Take 75 mg by mouth 2 (two) times daily. Prn       . risperiDONE (RISPERDAL) 0.5 MG tablet Take 1 tablet (0.5 mg total) by mouth at bedtime.  30 tablet  0  . [DISCONTINUED] norethindrone-ethinyl estradiol (JUNEL FE,GILDESS FE,LOESTRIN FE) 1-20 MG-MCG tablet Take 1 tablet by mouth daily.  1 Package  12   No current facility-administered  medications on file prior to visit.    Review of Systems:  As per HPI- otherwise negative.   Physical Examination: Filed Vitals:   04/13/13 0906  BP: 154/90  Pulse: 60  Temp: 97.7 F (36.5 C)  Resp: 16   Filed Vitals:   04/13/13 0906  Height: 5' 3.5" (1.613 m)  Weight: 218 lb 3.2 oz (98.975 kg)   Body mass index is 38.04 kg/(m^2). Ideal Body Weight: Weight in (lb) to have BMI = 25: 143.1  GEN: WDWN, NAD, Non-toxic, A & O x 3, obese HEENT: Atraumatic, Normocephalic. Neck supple. No masses, No LAD. Ears and Nose: No external deformity. CV: RRR, No M/G/R. No JVD. No thrill. No extra heart sounds. PULM: CTA B, no wheezes, crackles, rhonchi. No retractions. No resp. distress. No accessory muscle use. ABD: S, NT, ND, +BS. No rebound. No HSM. EXTR: No c/c/e NEURO Normal gait.  PSYCH: Normally interactive. Conversant. Not depressed or anxious appearing.  Calm demeanor.  She has tenderness in the muscles of the right lower  Back flexion and extension normal.  Normal LE strength, sensation, and negative SLR bilaterally.   Right foot: no swelling, redness or bruising.   She has tenderness over the medial midfoot which is non- specific.    [facility] reading (PRIMARY) by  Dr. Patsy Lager. Right foot: negative  RIGHT FOOT COMPLETE - 3+ VIEW  Comparison: None.  Findings: Bone mineralization is within normal limits. Mild joint space loss at the first metatarsal phalangeal joints and mild associated subchondral sclerosis. Alignment within normal limits. Calcaneus intact with degenerative spurring. No fracture or dislocation identified.  IMPRESSION: No acute osseous abnormality identified about the right foot.  Clinically significant discrepancy from primary report, if provided: None  Assessment and Plan: Pain in foot, right - Plan: DG Foot Complete Right  Back pain  Depression - Plan: POCT CBC, Comprehensive metabolic panel, TSH  Back spasm - Plan: cyclobenzaprine (FLEXERIL) 10  MG tablet  Ann Walter has had right foot pain for about 6 months since kicking one of her pet donkeys.   At this point I do not see any acute injury.  Will try placing her in a post- op shoe for about 2 weeks to see if this will resolve her pain.  If not plan to refer her to sports med  Back pain:  counseled her that I do not think further narcotics are indicated.  I will refill her flexeril to use if needed, but cautioned regarding sedation.  Await her labs and will contact her  Signed Abbe Amsterdam, MD  Results for orders placed in visit on 04/13/13  COMPREHENSIVE METABOLIC PANEL      Result Value Range   Sodium 135  135 - 145 mEq/L   Potassium 4.2  3.5 - 5.3 mEq/L   Chloride 105  96 - 112 mEq/L   CO2 25  19 - 32 mEq/L   Glucose, Bld 93  70 - 99 mg/dL   BUN 12  6 - 23 mg/dL   Creat 0.98  1.19 - 1.47 mg/dL   Total Bilirubin 0.7  0.3 - 1.2 mg/dL   Alkaline Phosphatase 59  39 - 117 U/L   AST 14  0 - 37 U/L   ALT 10  0 - 35 U/L   Total Protein 6.7  6.0 - 8.3 g/dL   Albumin 4.3  3.5 - 5.2 g/dL   Calcium 9.6  8.4 - 82.9 mg/dL  TSH      Result Value Range   TSH 1.503  0.350 - 4.500 uIU/mL  POCT CBC      Result Value Range   WBC 7.9  4.6 - 10.2 K/uL   Lymph, poc 2.1  0.6 - 3.4   POC LYMPH PERCENT 36.0  10 - 50 %L   MID (cbc) 0.5  0 - 0.9   POC MID % 6.1  0 - 12 %M   POC Granulocyte 5.4  2 - 6.9   Granulocyte percent 67.9  37 - 80 %G   RBC 4.31  4.04 - 5.48 M/uL   Hemoglobin 13.0  12.2 - 16.2 g/dL   HCT, POC 56.2  13.0 - 47.9 %   MCV 95.8  80 - 97 fL   MCH, POC 30.2  27 - 31.2 pg   MCHC 31.5 (*) 31.8 - 35.4 g/dL   RDW, POC 86.5     Platelet Count, POC 246  142 - 424 K/uL   MPV 9.7  0 - 99.8 fL

## 2013-04-18 ENCOUNTER — Ambulatory Visit (HOSPITAL_COMMUNITY): Payer: Self-pay | Admitting: Psychiatry

## 2013-04-20 ENCOUNTER — Encounter (HOSPITAL_COMMUNITY): Payer: Self-pay | Admitting: Psychiatry

## 2013-04-20 ENCOUNTER — Ambulatory Visit (INDEPENDENT_AMBULATORY_CARE_PROVIDER_SITE_OTHER): Payer: Private Health Insurance - Indemnity | Admitting: Psychiatry

## 2013-04-20 ENCOUNTER — Ambulatory Visit (HOSPITAL_COMMUNITY): Payer: Self-pay | Admitting: Marriage and Family Therapist

## 2013-04-20 VITALS — BP 138/79 | HR 82 | Ht 63.5 in | Wt 217.2 lb

## 2013-04-20 DIAGNOSIS — F39 Unspecified mood [affective] disorder: Secondary | ICD-10-CM

## 2013-04-20 MED ORDER — LAMOTRIGINE 150 MG PO TABS: 150.0000 mg | ORAL_TABLET | Freq: Every day | ORAL | Status: DC

## 2013-04-20 MED ORDER — RISPERIDONE 0.5 MG PO TABS: 0.5000 mg | ORAL_TABLET | Freq: Every day | ORAL | Status: DC
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
## 2013-04-28 ENCOUNTER — Ambulatory Visit (INDEPENDENT_AMBULATORY_CARE_PROVIDER_SITE_OTHER): Payer: Private Health Insurance - Indemnity | Admitting: Marriage and Family Therapist

## 2013-04-28 DIAGNOSIS — F411 Generalized anxiety disorder: Secondary | ICD-10-CM

## 2013-04-28 DIAGNOSIS — F330 Major depressive disorder, recurrent, mild: Secondary | ICD-10-CM
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
## 2013-05-04 ENCOUNTER — Ambulatory Visit (HOSPITAL_COMMUNITY): Payer: Self-pay | Admitting: Marriage and Family Therapist

## 2013-05-10 ENCOUNTER — Ambulatory Visit (HOSPITAL_COMMUNITY): Payer: Self-pay | Admitting: Marriage and Family Therapist

## 2013-05-10 ENCOUNTER — Other Ambulatory Visit: Payer: Self-pay | Admitting: Family Medicine

## 2013-05-10 DIAGNOSIS — F411 Generalized anxiety disorder: Secondary | ICD-10-CM

## 2013-05-18 ENCOUNTER — Ambulatory Visit (HOSPITAL_COMMUNITY): Payer: Self-pay | Admitting: Marriage and Family Therapist

## 2013-05-26 ENCOUNTER — Ambulatory Visit (HOSPITAL_COMMUNITY): Payer: Self-pay | Admitting: Marriage and Family Therapist

## 2013-06-02 ENCOUNTER — Ambulatory Visit (INDEPENDENT_AMBULATORY_CARE_PROVIDER_SITE_OTHER): Payer: Private Health Insurance - Indemnity | Admitting: Marriage and Family Therapist

## 2013-06-02 DIAGNOSIS — F411 Generalized anxiety disorder: Secondary | ICD-10-CM

## 2013-06-02 DIAGNOSIS — F0630 Mood disorder due to known physiological condition, unspecified: Secondary | ICD-10-CM

## 2013-06-02 NOTE — Progress Notes (Signed)
   THERAPIST PROGRESS NOTE  Session Time:  3:00 - 4:00 p.m.  Participation Level: Active  Behavioral Response: CasualAlertAnxious  Type of Therapy: Individual Therapy  Treatment Goals addressed: Coping  Interventions: Strength-based, Supportive and Family Systems  Summary: Ann Walter is a 47 y.o. female who presents with depression and anxiety.  She was referred by Dr. Lolly Mustache.  Patient reports having no depression since her last session.  She and her husband have been spending a lot of time together going sailing, being with friends, and spending time alone.  She reports she has been having some anxiety over what will happen with her stepdaughter because SD wants to stay with her mother.  Patient reports she has been trying to look at the situation from her point of view but also is concerned for her husband who is feeling rejected by his daughter.  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   Suicidal/Homicidal: NA  Therapist Response:  Assessed for depression and patient appears to having no symptoms not only since her last session but from the session before as well.  Patient says she feels more like herself.  Discussed how patient has made changes that have alleviated her depression including opening up when things are bothering her, and viewing her stepdaughter from a more positive light.  Patient is nervous about what will happen with her stepdaughter and wants to discuss this issue in the next session because a decision will have to be made before school starts.  Plan: Return again in 2 weeks.  Diagnosis: Axis I: Major depressive d/o, moderate; GAD    Axis II: Deferred    Jonanthony Nahar, LMFT, CTS 06/02/2013

## 2013-06-09 ENCOUNTER — Ambulatory Visit (HOSPITAL_COMMUNITY): Payer: Self-pay | Admitting: Marriage and Family Therapist

## 2013-06-10 ENCOUNTER — Other Ambulatory Visit: Payer: Self-pay | Admitting: Family Medicine

## 2013-06-10 DIAGNOSIS — F411 Generalized anxiety disorder: Secondary | ICD-10-CM

## 2013-06-14 ENCOUNTER — Other Ambulatory Visit: Payer: Self-pay | Admitting: Family Medicine

## 2013-06-14 DIAGNOSIS — F411 Generalized anxiety disorder: Secondary | ICD-10-CM

## 2013-06-14 MED ORDER — ALPRAZOLAM 0.5 MG PO TABS: ORAL_TABLET | ORAL | Status: DC

## 2013-06-16 ENCOUNTER — Ambulatory Visit (HOSPITAL_COMMUNITY): Payer: Self-pay | Admitting: Marriage and Family Therapist

## 2013-06-23 ENCOUNTER — Ambulatory Visit (HOSPITAL_COMMUNITY): Payer: Self-pay | Admitting: Marriage and Family Therapist

## 2013-07-12 ENCOUNTER — Ambulatory Visit (INDEPENDENT_AMBULATORY_CARE_PROVIDER_SITE_OTHER): Payer: Private Health Insurance - Indemnity | Admitting: Marriage and Family Therapist

## 2013-07-12 DIAGNOSIS — F330 Major depressive disorder, recurrent, mild: Secondary | ICD-10-CM

## 2013-07-12 DIAGNOSIS — F411 Generalized anxiety disorder: Secondary | ICD-10-CM

## 2013-07-12 NOTE — Progress Notes (Signed)
   THERAPIST PROGRESS NOTE  Session Time:  3:00 - 4:00 p.m.  Participation Level: Active  Behavioral Response: CasualAlertAngry  Type of Therapy: Individual Therapy  Treatment Goals addressed: Coping  Interventions: Strength-based and Supportive  Summary: Ann Walter is a 47 y.o. female who presents with depression and anxiety.  She was referred by Dr. Lolly Mustache.  Patient reports something happened to her about two weeks ago.  She reports she realized she "was a fool allowing others to take advantage of her and that she needs to stop."  Patient states since that time she has been expressing how she feels and her opinions not only at home, but everywhere.  She admits she is 'tired of keeping quiet."  Patient talked about how things were going with her stepdaughter being back, work, and how things are going with her husband.  She reports being with SD has been "up and down" but that patient is trying.  She also states things with her husband have been good and that they were actually able to get out and go fishing together until 3 a.m.  Suicidal/Homicidal: NA  Therapist Response:  Assessed patient's depression and patient admitted she has had none for the past two weeks.  Talked about patient "coming into her own" verbally since patient has a history of not expressing how she feels to others.  Talked about ways patient can express herself without expressing anger but patient states she likes how she is feeling right now.  Plan: Return again in 2 weeks.  Diagnosis: Axis I: Major depressive d/o, mild; GAD    Axis II: Deferred    [name], LMFT, CTS 07/12/2013

## 2013-07-20 ENCOUNTER — Ambulatory Visit (HOSPITAL_COMMUNITY): Payer: Self-pay | Admitting: Psychiatry

## 2013-07-26 ENCOUNTER — Ambulatory Visit (HOSPITAL_COMMUNITY): Payer: Self-pay | Admitting: Marriage and Family Therapist

## 2013-07-26 NOTE — Progress Notes (Unsigned)
   THERAPIST PROGRESS NOTE  Session Time:  3:00 - 4:00 p.m.  Participation Level: {BHH PARTICIPATION LEVEL:22264}  Behavioral Response: {Appearance:22683}{BHH LEVEL OF CONSCIOUSNESS:22305}{BHH MOOD:22306}  Type of Therapy: Individual Therapy  Treatment Goals addressed: Coping  Interventions: {CHL AMB BH Type of Intervention:21022753}  Summary: Ann Walter is a 47 y.o. female who presents with depression and anxiety.  She was referred by Dr. Lolly Mustache.   Suicidal/Homicidal: {BHH YES OR NO:22294}{yes/no/with/without intent/plan:22693}  Therapist Response: ***  Plan: Return again in 2 weeks.  Diagnosis: Axis I: 293.32; 300.02    Axis II: Deferred    Omari Koslosky, LMFT, CTS 07/26/2013

## 2013-08-05 ENCOUNTER — Other Ambulatory Visit: Payer: Self-pay | Admitting: Family Medicine

## 2013-08-05 DIAGNOSIS — F411 Generalized anxiety disorder: Secondary | ICD-10-CM

## 2013-08-17 ENCOUNTER — Ambulatory Visit (INDEPENDENT_AMBULATORY_CARE_PROVIDER_SITE_OTHER): Payer: Managed Care, Other (non HMO) | Admitting: Family Medicine

## 2013-08-17 ENCOUNTER — Encounter: Payer: Self-pay | Admitting: Family Medicine

## 2013-08-17 VITALS — BP 120/72 | HR 77 | Temp 98.7°F | Resp 18 | Ht 63.5 in | Wt 212.0 lb

## 2013-08-17 DIAGNOSIS — F411 Generalized anxiety disorder: Secondary | ICD-10-CM

## 2013-08-17 MED ORDER — ALPRAZOLAM 0.5 MG PO TABS: 0.5000 mg | ORAL_TABLET | Freq: Three times a day (TID) | ORAL | Status: DC | PRN
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
## 2013-08-17 NOTE — Patient Instructions (Signed)
Hang in there.  I hope that things get better soon.  If your headache does not get better please let me know

## 2013-08-31 ENCOUNTER — Ambulatory Visit (INDEPENDENT_AMBULATORY_CARE_PROVIDER_SITE_OTHER): Payer: Private Health Insurance - Indemnity | Admitting: Marriage and Family Therapist

## 2013-08-31 DIAGNOSIS — F330 Major depressive disorder, recurrent, mild: Secondary | ICD-10-CM
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
## 2013-09-14 ENCOUNTER — Ambulatory Visit (HOSPITAL_COMMUNITY): Payer: Self-pay | Admitting: Marriage and Family Therapist

## 2013-09-28 ENCOUNTER — Ambulatory Visit (HOSPITAL_COMMUNITY): Payer: Self-pay | Admitting: Marriage and Family Therapist

## 2013-10-07 ENCOUNTER — Other Ambulatory Visit (HOSPITAL_COMMUNITY): Payer: Self-pay | Admitting: Psychiatry

## 2013-10-08 ENCOUNTER — Other Ambulatory Visit (HOSPITAL_COMMUNITY): Payer: Self-pay | Admitting: Psychiatry

## 2013-10-12 ENCOUNTER — Ambulatory Visit (HOSPITAL_COMMUNITY): Payer: Self-pay | Admitting: Marriage and Family Therapist

## 2013-11-09 ENCOUNTER — Ambulatory Visit (HOSPITAL_COMMUNITY): Payer: Self-pay | Admitting: Marriage and Family Therapist

## 2013-11-17 ENCOUNTER — Other Ambulatory Visit: Payer: Self-pay | Admitting: Family Medicine

## 2013-11-17 DIAGNOSIS — F411 Generalized anxiety disorder: Secondary | ICD-10-CM

## 2013-11-22 ENCOUNTER — Other Ambulatory Visit: Payer: Self-pay | Admitting: Physician Assistant

## 2013-12-28 ENCOUNTER — Ambulatory Visit (HOSPITAL_COMMUNITY): Payer: Self-pay | Admitting: Marriage and Family Therapist

## 2014-01-16 ENCOUNTER — Other Ambulatory Visit: Payer: Self-pay | Admitting: Family Medicine

## 2014-01-16 DIAGNOSIS — F411 Generalized anxiety disorder: Secondary | ICD-10-CM

## 2014-02-08 ENCOUNTER — Other Ambulatory Visit: Payer: Self-pay

## 2014-02-08 DIAGNOSIS — Z1231 Encounter for screening mammogram for malignant neoplasm of breast: Secondary | ICD-10-CM

## 2014-02-10 ENCOUNTER — Encounter: Payer: Self-pay | Admitting: Women's Health

## 2014-02-10 ENCOUNTER — Ambulatory Visit (INDEPENDENT_AMBULATORY_CARE_PROVIDER_SITE_OTHER): Payer: Managed Care, Other (non HMO) | Admitting: Women's Health

## 2014-02-10 VITALS — BP 122/80 | Ht 63.0 in | Wt 222.0 lb

## 2014-02-10 DIAGNOSIS — Z13220 Encounter for screening for lipoid disorders: Secondary | ICD-10-CM

## 2014-02-10 DIAGNOSIS — IMO0001 Reserved for inherently not codable concepts without codable children: Secondary | ICD-10-CM

## 2014-02-10 DIAGNOSIS — Z309 Encounter for contraceptive management, unspecified: Secondary | ICD-10-CM

## 2014-02-10 DIAGNOSIS — Z01419 Encounter for gynecological examination (general) (routine) without abnormal findings: Secondary | ICD-10-CM

## 2014-02-10 LAB — CBC WITH DIFFERENTIAL/PLATELET
Basophils Absolute: 0 K/uL (ref 0.0–0.1)
Basophils Relative: 0 % (ref 0–1)
Eosinophils Absolute: 0.3 K/uL (ref 0.0–0.7)
Eosinophils Relative: 4 % (ref 0–5)
HCT: 36.1 % (ref 36.0–46.0)
Hemoglobin: 12.4 g/dL (ref 12.0–15.0)
Lymphocytes Relative: 36 % (ref 12–46)
Lymphs Abs: 2.8 K/uL (ref 0.7–4.0)
MCH: 30.5 pg (ref 26.0–34.0)
MCHC: 34.3 g/dL (ref 30.0–36.0)
MCV: 88.9 fL (ref 78.0–100.0)
Monocytes Absolute: 0.6 K/uL (ref 0.1–1.0)
Monocytes Relative: 8 % (ref 3–12)
Neutro Abs: 4.1 K/uL (ref 1.7–7.7)
Neutrophils Relative %: 52 % (ref 43–77)
Platelets: 261 K/uL (ref 150–400)
RBC: 4.06 MIL/uL (ref 3.87–5.11)
RDW: 14.4 % (ref 11.5–15.5)
WBC: 7.9 K/uL (ref 4.0–10.5)

## 2014-02-10 MED ORDER — NORETHINDRONE 0.35 MG PO TABS: ORAL_TABLET | ORAL | Status: DC

## 2014-02-10 NOTE — Patient Instructions (Signed)
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
## 2014-02-11 LAB — LIPID PANEL
CHOL/HDL RATIO: 2.7 ratio
CHOLESTEROL: 130 mg/dL (ref 0–200)
HDL: 49 mg/dL (ref >39)
LDL Cholesterol: 66 mg/dL (ref 0–99)
TRIGLYCERIDES: 73 mg/dL (ref <150)
VLDL: 15 mg/dL (ref 0–40)

## 2014-02-11 LAB — URINALYSIS W MICROSCOPIC + REFLEX CULTURE
BACTERIA UA: NONE SEEN
BILIRUBIN URINE: NEGATIVE
CASTS: NONE SEEN
CRYSTALS: NONE SEEN
GLUCOSE, UA: NEGATIVE mg/dL
Hgb urine dipstick: NEGATIVE
KETONES UR: NEGATIVE mg/dL
Leukocytes, UA: NEGATIVE
NITRITE: NEGATIVE
PH: 7 (ref 5.0–8.0)
Protein, ur: NEGATIVE mg/dL
Squamous Epithelial / LPF: NONE SEEN
Urobilinogen, UA: 0.2 mg/dL (ref 0.0–1.0)

## 2014-02-11 LAB — COMPREHENSIVE METABOLIC PANEL
ALT: 9 U/L (ref 0–35)
AST: 13 U/L (ref 0–37)
Albumin: 4.4 g/dL (ref 3.5–5.2)
Alkaline Phosphatase: 61 U/L (ref 39–117)
BILIRUBIN TOTAL: 0.5 mg/dL (ref 0.2–1.2)
BUN: 16 mg/dL (ref 6–23)
CO2: 26 meq/L (ref 19–32)
CREATININE: 0.92 mg/dL (ref 0.50–1.10)
Calcium: 9.2 mg/dL (ref 8.4–10.5)
Chloride: 103 mEq/L (ref 96–112)
GLUCOSE: 86 mg/dL (ref 70–99)
Potassium: 3.7 mEq/L (ref 3.5–5.3)
Sodium: 138 mEq/L (ref 135–145)
Total Protein: 6.7 g/dL (ref 6.0–8.3)

## 2014-02-12 ENCOUNTER — Encounter: Payer: Self-pay | Admitting: Women's Health

## 2014-02-13 ENCOUNTER — Ambulatory Visit (INDEPENDENT_AMBULATORY_CARE_PROVIDER_SITE_OTHER): Payer: Managed Care, Other (non HMO) | Admitting: Family Medicine

## 2014-02-13 VITALS — BP 125/76 | HR 65 | Temp 97.8°F | Resp 16 | Ht 63.5 in | Wt 222.0 lb

## 2014-02-13 DIAGNOSIS — I10 Essential (primary) hypertension: Secondary | ICD-10-CM

## 2014-02-13 DIAGNOSIS — R079 Chest pain, unspecified: Secondary | ICD-10-CM

## 2014-02-13 DIAGNOSIS — F411 Generalized anxiety disorder: Secondary | ICD-10-CM | POA: Insufficient documentation

## 2014-02-13 MED ORDER — LISINOPRIL 20 MG PO TABS: 40.0000 mg | ORAL_TABLET | Freq: Every day | ORAL | Status: DC

## 2014-02-13 MED ORDER — ALPRAZOLAM 0.5 MG PO TABS: 0.5000 mg | ORAL_TABLET | Freq: Three times a day (TID) | ORAL | Status: DC | PRN
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
## 2014-02-13 NOTE — Patient Instructions (Signed)
I will refer you back to cardiology for an evaluation.  If you have persistent, severe or worsening chest pain seek care right away Your recent cholesterol and other labs from your OBG looked good Start taking a baby aspirin daily until you see cardiology- they will advise you if you need to continue to take this Your blood pressure looks good

## 2014-02-17 ENCOUNTER — Ambulatory Visit
Admission: RE | Admit: 2014-02-17 | Discharge: 2014-02-17 | Disposition: A | Discharge status: Home / Self Care | Payer: Private Health Insurance - Indemnity | Source: Ambulatory Visit

## 2014-02-17 DIAGNOSIS — Z1231 Encounter for screening mammogram for malignant neoplasm of breast: Secondary | ICD-10-CM

## 2014-02-23 ENCOUNTER — Encounter: Payer: Self-pay | Admitting: Women's Health

## 2014-03-08 ENCOUNTER — Encounter: Payer: Self-pay | Admitting: Cardiology

## 2014-03-08 ENCOUNTER — Ambulatory Visit (INDEPENDENT_AMBULATORY_CARE_PROVIDER_SITE_OTHER): Payer: Private Health Insurance - Indemnity | Admitting: Cardiology

## 2014-03-08 VITALS — BP 136/79 | HR 67 | Ht 63.5 in | Wt 224.0 lb

## 2014-03-08 DIAGNOSIS — R0789 Other chest pain: Secondary | ICD-10-CM

## 2014-03-08 DIAGNOSIS — I10 Essential (primary) hypertension: Secondary | ICD-10-CM

## 2014-03-08 DIAGNOSIS — R011 Cardiac murmur, unspecified: Secondary | ICD-10-CM | POA: Insufficient documentation

## 2014-03-08 DIAGNOSIS — R002 Palpitations: Secondary | ICD-10-CM

## 2014-03-08 NOTE — Progress Notes (Signed)
[address] [place], [place]  [address] Phone: [phone] Fax:  [phone]  Date:  03/08/2014   ID:  Ann KelpWanda [name] Pridgen, DOB 11/27/1965, MRN [id number]  PCP:  Abbe AmsterdamOPLAND,JESSICA, MD  Cardiologist:  Armanda Magicraci Evens Meno, MD     History of Present Illness: Ann Walter is a 48 y.o. female with a history of HTN and a remote history of chest pain with normal cardiac cath remotely who presents today for chest pain.  She lost her mom in January and thinks it may be related to stress.  The CP can occur at any time.  It can occur when lying on the sofa but gets much worse when she gets up off the couch.  She describes it as a tightening or squeezing sensation in her chest with no radiation and has no diaphoresis or nausea with the CP but occasionally has some random nausea.  She has been told in the past that she has a slight heart murmur.  Her mom had CAD.  The CP occurs a few times a month and usually lasts no more than 5-10 minutes.  She denies any LE edema, dizziness or syncope.     Wt Readings from Last 3 Encounters:  03/08/14 224 lb (101.606 kg)  02/13/14 222 lb (100.699 kg)  02/10/14 222 lb (100.699 kg)     Past Medical History  Diagnosis Date  . Obesity   . UTI (urinary tract infection), uncomplicated   . Depression   . HTN (hypertension)   . Benign hematuria 12/2009    WORKUP WITH DR. NESI WAS NEGATIVE  . Heart murmur   . Anxiety   . Allergy     Current Outpatient Prescriptions  Medication Sig Dispense Refill  . ALPRAZolam (XANAX) 0.5 MG tablet Take 1 tablet (0.5 mg total) by mouth 3 (three) times daily as needed for anxiety.  90 tablet  1  . lisinopril (PRINIVIL,ZESTRIL) 20 MG tablet Take 2 tablets (40 mg total) by mouth daily.  180 tablet  3  . norethindrone (MICRONOR,CAMILA,ERRIN) 0.35 MG tablet TAKE 1 TABLET EVERY DAY  84 tablet  4  . ranitidine (ZANTAC) 75 MG tablet Take 75 mg by mouth 2 (two) times daily. Prn       . [DISCONTINUED] norethindrone-ethinyl  estradiol (JUNEL FE,GILDESS FE,LOESTRIN FE) 1-20 MG-MCG tablet Take 1 tablet by mouth daily.  1 Package  12   No current facility-administered medications for this visit.    Allergies:    Allergies  Allergen Reactions  . Wellbutrin [Bupropion]     Throat swelling   . Sulfonamide Derivatives     Social History:  The patient  reports that she has never smoked. She has never used smokeless tobacco. She reports that she does not drink alcohol or use illicit drugs.   Family History:  The patient's family history includes Breast cancer in her maternal grandmother and sister; Cancer in her maternal aunt and maternal grandmother; Diabetes in her mother; Heart disease in her mother; Hypertension in her mother; Kidney disease in her father. There is no history of Colon cancer.   ROS:  Please see the history of present illness.      All other systems reviewed and negative.   PHYSICAL EXAM: VS:  BP 136/79  Pulse 67  Ht 5' 3.5" (1.613 m)  Wt 224 lb (101.606 kg)  BMI 39.05 kg/m2 Well nourished, well developed, in no acute distress HEENT: normal Neck: no JVD Cardiac:  normal S1, S2; RRR; 1/6  systolic murmur RUSB Lungs:  clear to auscultation bilaterally, no wheezing, rhonchi or rales Abd: soft, nontender, no hepatomegaly Ext: no edema Skin: warm and dry Neuro:  CNs 2-12 intact, no focal abnormalities noted  EKG:     NSR with no ST changes  ASSESSMENT AND PLAN:  1. Atypical Chest pain with normal coronary arteries by remote cath - ETT to rule out ischemia 2. Palpitations - check event monitor to assess for arrhythmias 3. HTN well controlled 4.  Heart murmur - check 2D echo to assess  Followup with me PRN pending results of studies  Signed, Armanda Magicraci Azya Barbero, MD 03/08/2014 3:34 PM

## 2014-03-08 NOTE — Patient Instructions (Signed)
Your physician recommends that you continue on your current medications as directed. Please refer to the Current Medication list given to you today.  Your physician has requested that you have an echocardiogram. Echocardiography is a painless test that uses sound waves to create images of your heart. It provides your doctor with information about the size and shape of your heart and how well your heart's chambers and valves are working. This procedure takes approximately one hour. There are no restrictions for this procedure.  Your physician has requested that you have an exercise tolerance test. For further information please visit [email or web address]. Please also follow instruction sheet, as given.  Your physician has recommended that you wear an event monitor. Event monitors are medical devices that record the heart's electrical activity. Doctors most often us these monitors to diagnose arrhythmias. Arrhythmias are problems with the speed or rhythm of the heartbeat. The monitor is a small, portable device. You can wear one while you do your normal daily activities. This is usually used to diagnose what is causing palpitations/syncope (passing out).  Follow up to be determined after testing.

## 2014-03-13 ENCOUNTER — Encounter: Payer: Self-pay | Admitting: *Deleted

## 2014-03-13 ENCOUNTER — Encounter (INDEPENDENT_AMBULATORY_CARE_PROVIDER_SITE_OTHER): Payer: Private Health Insurance - Indemnity

## 2014-03-13 DIAGNOSIS — R002 Palpitations: Secondary | ICD-10-CM

## 2014-03-13 NOTE — Progress Notes (Signed)
Patient ID: Ann KelpWanda [name] Walter, female   DOB: [birth year], 48 y.o.   MRN: [id number] [employer] 30 day cardiac event monitor applied to patient.

## 2014-03-26 ENCOUNTER — Other Ambulatory Visit: Payer: Self-pay | Admitting: Physician Assistant

## 2014-04-07 ENCOUNTER — Telehealth (HOSPITAL_COMMUNITY): Payer: Self-pay

## 2014-04-11 ENCOUNTER — Encounter: Payer: Self-pay | Admitting: Physician Assistant

## 2014-04-11 ENCOUNTER — Ambulatory Visit (HOSPITAL_BASED_OUTPATIENT_CLINIC_OR_DEPARTMENT_OTHER): Payer: Private Health Insurance - Indemnity | Admitting: Cardiology

## 2014-04-11 ENCOUNTER — Ambulatory Visit (HOSPITAL_COMMUNITY)
Admission: RE | Admit: 2014-04-11 | Discharge: 2014-04-11 | Disposition: A | Discharge status: Home / Self Care | Payer: Private Health Insurance - Indemnity | Source: Ambulatory Visit | Attending: Cardiology | Admitting: Cardiology

## 2014-04-11 DIAGNOSIS — R0789 Other chest pain: Secondary | ICD-10-CM | POA: Insufficient documentation

## 2014-04-11 DIAGNOSIS — R002 Palpitations: Secondary | ICD-10-CM | POA: Insufficient documentation

## 2014-04-11 DIAGNOSIS — R011 Cardiac murmur, unspecified: Secondary | ICD-10-CM

## 2014-04-11 NOTE — Progress Notes (Signed)
Echo performed. 

## 2014-04-13 ENCOUNTER — Other Ambulatory Visit: Payer: Self-pay | Admitting: Family Medicine

## 2014-04-13 DIAGNOSIS — F411 Generalized anxiety disorder: Secondary | ICD-10-CM

## 2014-04-18 ENCOUNTER — Telehealth: Payer: Self-pay | Admitting: Cardiology

## 2014-04-18 NOTE — Telephone Encounter (Signed)
Pt is aware.  

## 2014-04-18 NOTE — Telephone Encounter (Signed)
Please let patient know that heart monitor is fine

## 2014-04-26 NOTE — Telephone Encounter (Signed)
Encounter complete. 

## 2014-05-09 NOTE — Telephone Encounter (Signed)
Encounter complete. 

## 2014-05-16 ENCOUNTER — Ambulatory Visit (INDEPENDENT_AMBULATORY_CARE_PROVIDER_SITE_OTHER): Payer: Managed Care, Other (non HMO)

## 2014-05-16 ENCOUNTER — Ambulatory Visit (INDEPENDENT_AMBULATORY_CARE_PROVIDER_SITE_OTHER): Payer: Managed Care, Other (non HMO) | Admitting: Internal Medicine

## 2014-05-16 VITALS — BP 136/90 | HR 67 | Temp 98.0°F | Resp 16 | Ht 63.5 in | Wt 220.2 lb

## 2014-05-16 DIAGNOSIS — M79609 Pain in unspecified limb: Secondary | ICD-10-CM

## 2014-05-16 DIAGNOSIS — W57XXXA Bitten or stung by nonvenomous insect and other nonvenomous arthropods, initial encounter: Secondary | ICD-10-CM

## 2014-05-16 DIAGNOSIS — R0602 Shortness of breath: Secondary | ICD-10-CM

## 2014-05-16 DIAGNOSIS — T148 Other injury of unspecified body region: Secondary | ICD-10-CM

## 2014-05-16 DIAGNOSIS — Z23 Encounter for immunization: Secondary | ICD-10-CM

## 2014-05-16 DIAGNOSIS — M79641 Pain in right hand: Secondary | ICD-10-CM

## 2014-05-16 DIAGNOSIS — R197 Diarrhea, unspecified: Secondary | ICD-10-CM

## 2014-05-16 MED ORDER — CETIRIZINE HCL 10 MG PO TABS
10.0000 mg | ORAL_TABLET | Freq: Once | ORAL | Status: AC
  Administered 2014-05-16: 10 mg via ORAL

## 2014-05-16 MED ORDER — DIPHENHYDRAMINE HCL 25 MG PO CAPS
50.0000 mg | ORAL_CAPSULE | Freq: Once | ORAL | Status: AC
  Administered 2014-05-16: 50 mg via ORAL

## 2014-05-16 MED ORDER — HYDROCODONE-ACETAMINOPHEN 5-325 MG PO TABS: 1.0000 | ORAL_TABLET | Freq: Four times a day (QID) | ORAL | Status: DC | PRN

## 2014-05-16 MED ORDER — METHYLPREDNISOLONE ACETATE 80 MG/ML IJ SUSP
80.0000 mg | Freq: Once | INTRAMUSCULAR | Status: AC
  Administered 2014-05-16: 80 mg via INTRAMUSCULAR

## 2014-05-16 MED ORDER — DIPHENHYDRAMINE HCL 25 MG PO CAPS: 25.0000 mg | ORAL_CAPSULE | Freq: Once | ORAL | Status: DC

## 2014-05-16 NOTE — Patient Instructions (Signed)
Insect Bite Mosquitoes, flies, fleas, bedbugs, and many other insects can bite. Insect bites are different from insect stings. A sting is when venom is injected into the skin. Some insect bites can transmit infectious diseases. SYMPTOMS  Insect bites usually turn red, swell, and itch for 2 to 4 days. They often go away on their own. TREATMENT  Your caregiver may prescribe antibiotic medicines if a bacterial infection develops in the bite. HOME CARE INSTRUCTIONS  Do not scratch the bite area.  Keep the bite area clean and dry. Wash the bite area thoroughly with soap and water.  Put ice or cool compresses on the bite area.  Put ice in a plastic bag.  Place a towel between your skin and the bag.  Leave the ice on for 20 minutes, 4 times a day for the first 2 to 3 days, or as directed.  You may apply a baking soda paste, cortisone cream, or calamine lotion to the bite area as directed by your caregiver. This can help reduce itching and swelling.  Only take over-the-counter or prescription medicines as directed by your caregiver.  If you are given antibiotics, take them as directed. Finish them even if you start to feel better. You may need a tetanus shot if:  You cannot remember when you had your last tetanus shot.  You have never had a tetanus shot.  The injury broke your skin. If you get a tetanus shot, your arm may swell, get red, and feel warm to the touch. This is common and not a problem. If you need a tetanus shot and you choose not to have one, there is a rare chance of getting tetanus. Sickness from tetanus can be serious. SEEK IMMEDIATE MEDICAL CARE IF:   You have increased pain, redness, or swelling in the bite area.  You see a red line on the skin coming from the bite.  You have a fever.  You have joint pain.  You have a headache or neck pain.  You have unusual weakness.  You have a rash.  You have chest pain or shortness of breath.  You have abdominal pain,  nausea, or vomiting.  You feel unusually tired or sleepy. MAKE SURE YOU:   Understand these instructions.  Will watch your condition.  Will get help right away if you are not doing well or get worse. Document Released: 11/20/2004 Document Revised: 01/05/2012 Document Reviewed: 05/14/2011 [facility] Patient Information 2015 [place]. This information is not intended to replace advice given to you by your health care provider. Make sure you discuss any questions you have with your health care provider. Chronic Obstructive Pulmonary Disease Chronic obstructive pulmonary disease (COPD) is a common lung condition in which airflow from the lungs is limited. COPD is a general term that can be used to describe many different lung problems that limit airflow, including both chronic bronchitis and emphysema. If you have COPD, your lung function will probably never return to normal, but there are measures you can take to improve lung function and make yourself feel better.  CAUSES   Smoking (common).   Exposure to secondhand smoke.   Genetic problems.  Chronic inflammatory lung diseases or recurrent infections. SYMPTOMS   Shortness of breath, especially with physical activity.   Deep, persistent (chronic) cough with a large amount of thick mucus.   Wheezing.   Rapid breaths (tachypnea).   Gray or bluish discoloration (cyanosis) of the skin, especially in fingers, toes, or lips.   Fatigue.  Weight loss.   Frequent infections or episodes when breathing symptoms become much worse (exacerbations).   Chest tightness. DIAGNOSIS  Your healthcare provider will take a medical history and perform a physical examination to make the initial diagnosis. Additional tests for COPD may include:   Lung (pulmonary) function tests.  Chest X-ray.  CT scan.  Blood tests. TREATMENT  Treatment available to help you feel better when you have COPD include:   Inhaler and nebulizer  medicines. These help manage the symptoms of COPD and make your breathing more comfortable  Supplemental oxygen. Supplemental oxygen is only helpful if you have a low oxygen level in your blood.   Exercise and physical activity. These are beneficial for nearly all people with COPD. Some people may also benefit from a pulmonary rehabilitation program. HOME CARE INSTRUCTIONS   Take all medicines (inhaled or pills) as directed by your health care provider.  Only take over-the-counter or prescription medicines for pain, fever, or discomfort as directed by your health care provider.   Avoid over-the-counter medicines or cough syrups that dry up your airway (such as antihistamines) and slow down the elimination of secretions unless instructed otherwise by your healthcare provider.   If you are a smoker, the most important thing that you can do is stop smoking. Continuing to smoke will cause further lung damage and breathing trouble. Ask your health care provider for help with quitting smoking. He or she can direct you to community resources or hospitals that provide support.  Avoid exposure to irritants such as smoke, chemicals, and fumes that aggravate your breathing.  Use oxygen therapy and pulmonary rehabilitation if directed by your health care provider. If you require home oxygen therapy, ask your healthcare provider whether you should purchase a pulse oximeter to measure your oxygen level at home.   Avoid contact with individuals who have a contagious illness.  Avoid extreme temperature and humidity changes.  Eat healthy foods. Eating smaller, more frequent meals and resting before meals may help you maintain your strength.  Stay active, but balance activity with periods of rest. Exercise and physical activity will help you maintain your ability to do things you want to do.  Preventing infection and hospitalization is very important when you have COPD. Make sure to receive all the  vaccines your health care provider recommends, especially the pneumococcal and influenza vaccines. Ask your healthcare provider whether you need a pneumonia vaccine.  Learn and use relaxation techniques to manage stress.  Learn and use controlled breathing techniques as directed by your health care provider. Controlled breathing techniques include:   Pursed lip breathing. Start by breathing in (inhaling) through your nose for 1 second. Then, purse your lips as if you were going to whistle and breathe out (exhale) through the pursed lips for 2 seconds.   Diaphragmatic breathing. Start by putting one hand on your abdomen just above your waist. Inhale slowly through your nose. The hand on your abdomen should move out. Then purse your lips and exhale slowly. You should be able to feel the hand on your abdomen moving in as you exhale.   Learn and use controlled coughing to clear mucus from your lungs. Controlled coughing is a series of short, progressive coughs. The steps of controlled coughing are:  1. Lean your head slightly forward.  2. Breathe in deeply using diaphragmatic breathing.  3. Try to hold your breath for 3 seconds.  4. Keep your mouth slightly open while coughing twice.  5. Spit any mucus  out into a tissue.  6. Rest and repeat the steps once or twice as needed. SEEK MEDICAL CARE IF:   You are coughing up more mucus than usual.   There is a change in the color or thickness of your mucus.   Your breathing is more labored than usual.   Your breathing is faster than usual.  SEEK IMMEDIATE MEDICAL CARE IF:   You have shortness of breath while you are resting.   You have shortness of breath that prevents you from:  Being able to talk.   Performing your usual physical activities.   You have chest pain lasting longer than 5 minutes.   Your skin color is more cyanotic than usual.  You measure low oxygen saturations for longer than 5 minutes with a pulse  oximeter. MAKE SURE YOU:   Understand these instructions.  Will watch your condition.  Will get help right away if you are not doing well or get worse. Document Released: 07/23/2005 Document Revised: 08/03/2013 Document Reviewed: 06/09/2013 [facility] Patient Information 2015 [place], [place]. This information is not intended to replace advice given to you by your health care provider. Make sure you discuss any questions you have with your health care provider. Food Choices to Help Relieve Diarrhea When you have diarrhea, the foods you eat and your eating habits are very important. Choosing the right foods and drinks can help relieve diarrhea. Also, because diarrhea can last up to 7 days, you need to replace lost fluids and electrolytes (such as sodium, potassium, and chloride) in order to help prevent dehydration.  WHAT GENERAL GUIDELINES DO I NEED TO FOLLOW?  Slowly drink 1 cup (8 oz) of fluid for each episode of diarrhea. If you are getting enough fluid, your urine will be clear or pale yellow.  Eat starchy foods. Some good choices include white rice, white toast, pasta, low-fiber cereal, baked potatoes (without the skin), saltine crackers, and bagels.  Avoid large servings of any cooked vegetables.  Limit fruit to two servings per day. A serving is  cup or 1 small piece.  Choose foods with less than 2 g of fiber per serving.  Limit fats to less than 8 tsp (38 g) per day.  Avoid fried foods.  Eat foods that have probiotics in them. Probiotics can be found in certain dairy products.  Avoid foods and beverages that may increase the speed at which food moves through the stomach and intestines (gastrointestinal tract). Things to avoid include:  High-fiber foods, such as dried fruit, raw fruits and vegetables, nuts, seeds, and whole grain foods.  Spicy foods and high-fat foods.  Foods and beverages sweetened with high-fructose corn syrup, honey, or sugar alcohols such as xylitol,  sorbitol, and mannitol. WHAT FOODS ARE RECOMMENDED? Grains White rice. White, [place], or pita breads (fresh or toasted), including plain rolls, buns, or bagels. White pasta. Saltine, soda, or graham crackers. Pretzels. Low-fiber cereal. Cooked cereals made with water (such as cornmeal, farina, or cream cereals). Plain muffins. Matzo. Melba toast. Zwieback.  Vegetables Potatoes (without the skin). Strained tomato and vegetable juices. Most well-cooked and canned vegetables without seeds. Tender lettuce. Fruits Cooked or canned applesauce, apricots, cherries, fruit cocktail, grapefruit, peaches, pears, or plums. Fresh bananas, apples without skin, cherries, grapes, cantaloupe, grapefruit, peaches, oranges, or plums.  Meat and Other Protein Products Baked or boiled chicken. Eggs. Tofu. Fish. Seafood. Smooth peanut butter. Ground or well-cooked tender beef, ham, veal, lamb, pork, or poultry.  Dairy Plain yogurt, kefir, and unsweetened liquid  yogurt. Lactose-free milk, buttermilk, or soy milk. Plain hard cheese. Beverages Sport drinks. Clear broths. Diluted fruit juices (except prune). Regular, caffeine-free sodas such as ginger ale. Water. Decaffeinated teas. Oral rehydration solutions. Sugar-free beverages not sweetened with sugar alcohols. Other Bouillon, broth, or soups made from recommended foods.  The items listed above may not be a complete list of recommended foods or beverages. Contact your dietitian for more options. WHAT FOODS ARE NOT RECOMMENDED? Grains Whole grain, whole wheat, bran, or rye breads, rolls, pastas, crackers, and cereals. Wild or brown rice. Cereals that contain more than 2 g of fiber per serving. Corn tortillas or taco shells. Cooked or dry oatmeal. Granola. Popcorn. Vegetables Raw vegetables. Cabbage, broccoli, Brussels sprouts, artichokes, baked beans, beet greens, corn, kale, legumes, peas, sweet potatoes, and yams. Potato skins. Cooked spinach and  cabbage. Fruits Dried fruit, including raisins and dates. Raw fruits. Stewed or dried prunes. Fresh apples with skin, apricots, mangoes, pears, raspberries, and strawberries.  Meat and Other Protein Products Chunky peanut butter. Nuts and seeds. Beans and lentils. Tomasa Blase.  Dairy High-fat cheeses. Milk, chocolate milk, and beverages made with milk, such as milk shakes. Cream. Ice cream. Sweets and Desserts Sweet rolls, doughnuts, and sweet breads. Pancakes and waffles. Fats and Oils Butter. Cream sauces. Margarine. Salad oils. Plain salad dressings. Olives. Avocados.  Beverages Caffeinated beverages (such as coffee, tea, soda, or energy drinks). Alcoholic beverages. Fruit juices with pulp. Prune juice. Soft drinks sweetened with high-fructose corn syrup or sugar alcohols. Other Coconut. Hot sauce. Chili powder. Mayonnaise. Gravy. Cream-based or milk-based soups.  The items listed above may not be a complete list of foods and beverages to avoid. Contact your dietitian for more information. WHAT SHOULD I DO IF I BECOME DEHYDRATED? Diarrhea can sometimes lead to dehydration. Signs of dehydration include dark urine and dry mouth and skin. If you think you are dehydrated, you should rehydrate with an oral rehydration solution. These solutions can be purchased at pharmacies, retail stores, or online.  Drink -1 cup (120-240 mL) of oral rehydration solution each time you have an episode of diarrhea. If drinking this amount makes your diarrhea worse, try drinking smaller amounts more often. For example, drink 1-3 tsp (5-15 mL) every 5-10 minutes.  A general rule for staying hydrated is to drink 1-2 L of fluid per day. Talk to your health care provider about the specific amount you should be drinking each day. Drink enough fluids to keep your urine clear or pale yellow. Document Released: 01/03/2004 Document Revised: 10/18/2013 Document Reviewed: 09/05/2013 [facility] Patient Information 2015 [place],  [place]. This information is not intended to replace advice given to you by your health care provider. Make sure you discuss any questions you have with your health care provider.

## 2014-05-16 NOTE — Progress Notes (Signed)
   Subjective:    Patient ID: Ann Walter, female    DOB: [birth year], 48 y.o.   MRN: [id number]  HPI 48 year old female here for bug bite and diarrhea. She was bit by a bug on her right hand today. She felt a bite on her hand. Five minutes after being bit her hand started swelling. Some numbness and tingling in her hand. Has full rom but painful. Insect in bed room but never found.  Pt also complains of diarrhea for two days. Stool is watery and green in color. Nausea, but no emesis. Some abdominal discomfort, but not severe pain. Yesterday she had diarrhea about five or six times. She has had two episodes of diarrhea today. Has not been around anyone else that is sick. Has not taken any OTC medications.  Pt would like to be tested for COPD. Non-smoker. Sometimes she feels like she can't take a deep breath. Her mother passed away back in January from COPD. Pt says she gets chest pain, but has been evaluated by a cardiologist.  Fhx mom died of copd, never smoked but had chronic bronchitis.  Review of Systems  Constitutional: Negative.   HENT: Negative.   Eyes: Negative.   Respiratory: Positive for shortness of breath. Negative for cough, chest tightness and wheezing.   Cardiovascular: Negative.   Gastrointestinal: Positive for diarrhea. Negative for nausea, abdominal pain, abdominal distention and anal bleeding.  Endocrine: Negative.   Genitourinary: Negative.   Skin: Negative.   Allergic/Immunologic: Negative.   Neurological: Negative.   Hematological: Negative.   Psychiatric/Behavioral: The patient is nervous/anxious.    See problem list in epic    Objective:   Physical Exam  Constitutional: She is oriented to person, place, and time. She appears well-developed and well-nourished. No distress.  HENT:  Head: Normocephalic.  Mouth/Throat: Oropharynx is clear and moist.  Eyes: EOM are normal. Pupils are equal, round, and reactive to light.  Neck: Normal range of motion. Neck  supple.  Cardiovascular: Normal rate, regular rhythm, normal heart sounds and intact distal pulses.   Pulmonary/Chest: Effort normal and breath sounds normal. No respiratory distress. She has no wheezes. She has no rales. She exhibits no tenderness.  Abdominal: There is no tenderness.  Musculoskeletal: She exhibits edema.       Right hand: She exhibits tenderness and swelling. She exhibits normal range of motion, no bony tenderness and normal two-point discrimination. Decreased sensation noted. Normal strength noted.       Hands: Bite site at X, edema of fingers, palm, and dorsal hand.  Good capillary filling distal Scattered sensory changes No erythema  Neurological: She is alert and oriented to person, place, and time. No cranial nerve deficit. She exhibits normal muscle tone. Coordination normal.  Skin: No rash noted.  Psychiatric: She has a normal mood and affect. Her behavior is normal.   PFR 350 normal 375/oximetry 98% [facility] reading (PRIMARY) by  Dr.Guest mother died of copd young and never smoked, no signs of copd on cxr        Assessment & Plan:  Elevate hand above heart/Go to ER tonite if lose circulation/RTC in am for recheck Depomedrol 80mg  im/Benedryl 50mg  po/Zyrtec 10mg  now Diarrhea BRAT diet/immodium  Benedryl tonite 50mg  in 6hrs

## 2014-05-17 ENCOUNTER — Ambulatory Visit (INDEPENDENT_AMBULATORY_CARE_PROVIDER_SITE_OTHER): Payer: Managed Care, Other (non HMO) | Admitting: Family Medicine

## 2014-05-17 VITALS — BP 138/82 | HR 76 | Temp 97.7°F | Resp 16 | Ht 63.5 in | Wt 221.8 lb

## 2014-05-17 DIAGNOSIS — M79609 Pain in unspecified limb: Secondary | ICD-10-CM

## 2014-05-17 DIAGNOSIS — T6391XA Toxic effect of contact with unspecified venomous animal, accidental (unintentional), initial encounter: Secondary | ICD-10-CM

## 2014-05-17 DIAGNOSIS — M79641 Pain in right hand: Secondary | ICD-10-CM

## 2014-05-17 DIAGNOSIS — Z5189 Encounter for other specified aftercare: Secondary | ICD-10-CM

## 2014-05-17 DIAGNOSIS — T63484D Toxic effect of venom of other arthropod, undetermined, subsequent encounter: Secondary | ICD-10-CM

## 2014-05-17 NOTE — Progress Notes (Signed)
[facility] [address], [place] [address] [phone]  Date:  05/17/2014   Name:  Ann Walter   DOB:  10-26-1966   MRN:  [id number]  PCP:  Abbe Amsterdam, MD    Chief Complaint: Follow-up and Hand Pain   History of Present Illness:  Ann Walter is a 48 y.o. very pleasant female patient who presents with the following:  She was seen here yesterday with an insect sting to her right hand.  She was tx with depomedrol/ benadryl/ zyrtec.   She notes that her swelling is much better, but her hand is still uncomfortable.  She is able to feel her hand again today. Her breathing seems better today  Diarrhea seems to be better today.   She has d/c'd her arm sling today.   She does not need anything else today  Patient Active Problem List   Diagnosis Date Noted  . Chest pain, atypical 03/08/2014  . Heart murmur 03/08/2014  . Heart palpitations 03/08/2014  . GAD (generalized anxiety disorder) 02/13/2014  . Depression   . HTN (hypertension)     Past Medical History  Diagnosis Date  . Obesity   . UTI (urinary tract infection), uncomplicated   . Depression   . HTN (hypertension)   . Benign hematuria 12/2009    WORKUP WITH DR. NESI WAS NEGATIVE  . Heart murmur   . Anxiety   . Allergy     Past Surgical History  Procedure Laterality Date  . Cholecystectomy  10/2010  . Cystectomy      x 2 from chest    History  Substance Use Topics  . Smoking status: Never Smoker   . Smokeless tobacco: Never Used  . Alcohol Use: No    Family History  Problem Relation Age of Onset  . Diabetes Mother   . Heart disease Mother     HAD TRIPLE BYPASS  . Hypertension Mother   . Kidney disease Father   . Breast cancer Sister   . Breast cancer Maternal Grandmother   . Cancer Maternal Grandmother     LYMPH NODE CANCER  . Colon cancer Neg Hx   . Cancer Maternal Aunt     COLORECTAL     Allergies  Allergen Reactions  . Wellbutrin [Bupropion]     Throat swelling   . Sulfonamide Derivatives     Medication list has been reviewed and updated.  Current Outpatient Prescriptions on File Prior to Visit  Medication Sig Dispense Refill  . ALPRAZolam (XANAX) 0.5 MG tablet take 1 tablet by mouth three times a day if needed  90 tablet  1  . HYDROcodone-acetaminophen (NORCO/VICODIN) 5-325 MG per tablet Take 1 tablet by mouth every 6 (six) hours as needed.  30 tablet  0  . lisinopril (PRINIVIL,ZESTRIL) 20 MG tablet Take 2 tablets (40 mg total) by mouth daily.  180 tablet  3  . norethindrone (MICRONOR,CAMILA,ERRIN) 0.35 MG tablet TAKE 1 TABLET EVERY DAY  84 tablet  4  . ranitidine (ZANTAC) 75 MG tablet Take 75 mg by mouth 2 (two) times daily. Prn       . [DISCONTINUED] norethindrone-ethinyl estradiol (JUNEL FE,GILDESS FE,LOESTRIN FE) 1-20 MG-MCG tablet Take 1 tablet by mouth daily.  1 Package  12   No current facility-administered medications on file prior to visit.    Review of Systems:  As per HPI- otherwise negative.   Physical Examination: Filed Vitals:   05/17/14 0852  BP: 138/82  Pulse: 76  Temp: 97.7 F (36.5 C)  Resp: 16   Filed Vitals:   05/17/14 0852  Height: 5' 3.5" (1.613 m)  Weight: 221 lb 12.8 oz (100.608 kg)   Body mass index is 38.67 kg/(m^2). Ideal Body Weight: Weight in (lb) to have BMI = 25: 143.1  GEN: WDWN, NAD, Non-toxic, A & O x 3, obese, looks well HEENT: Atraumatic, Normocephalic. Neck supple. No masses, No LAD. Ears and Nose: No external deformity. CV: RRR, No M/G/R. No JVD. No thrill. No extra heart sounds. PULM: CTA B, no wheezes, crackles, rhonchi. No retractions. No resp. distress. No accessory muscle use. EXTR: No c/c/e NEURO Normal gait.  PSYCH: Normally interactive. Conversant. Not depressed or anxious appearing.  Calm demeanor.  Right hand: mild tenderness over the 2nd MCP.  Mild swelling- pt showed me pictures from yesterday and she is much better.    Assessment and Plan: Insect sting,  undetermined intent, subsequent encounter  Pain of right hand  Much improved insect sting to right hand.  follow-up if not continuing to improve  Signed Abbe AmsterdamJessica Domanick Cuccia, MD

## 2014-06-12 ENCOUNTER — Other Ambulatory Visit: Payer: Self-pay | Admitting: Family Medicine

## 2014-06-12 DIAGNOSIS — F411 Generalized anxiety disorder: Secondary | ICD-10-CM

## 2014-07-18 ENCOUNTER — Other Ambulatory Visit: Payer: Self-pay | Admitting: Family Medicine

## 2014-07-18 DIAGNOSIS — F411 Generalized anxiety disorder: Secondary | ICD-10-CM

## 2014-10-16 ENCOUNTER — Other Ambulatory Visit: Payer: Self-pay | Admitting: Family Medicine

## 2014-10-16 ENCOUNTER — Telehealth: Payer: Self-pay

## 2014-10-16 DIAGNOSIS — F411 Generalized anxiety disorder: Secondary | ICD-10-CM

## 2014-10-16 NOTE — Telephone Encounter (Signed)
Pt wants a med refill on ALPRAZolam (XANAX) 0.5 MG tablet [[id number]. Please advise at [phone]

## 2014-10-17 MED ORDER — ALPRAZOLAM 0.5 MG PO TABS: ORAL_TABLET | ORAL | Status: DC

## 2014-11-06 ENCOUNTER — Other Ambulatory Visit: Payer: Self-pay | Admitting: Family Medicine

## 2014-11-06 DIAGNOSIS — F411 Generalized anxiety disorder: Secondary | ICD-10-CM

## 2014-11-14 ENCOUNTER — Ambulatory Visit (INDEPENDENT_AMBULATORY_CARE_PROVIDER_SITE_OTHER): Payer: BLUE CROSS/BLUE SHIELD | Admitting: Emergency Medicine

## 2014-11-14 ENCOUNTER — Telehealth: Payer: Self-pay

## 2014-11-14 ENCOUNTER — Ambulatory Visit (HOSPITAL_COMMUNITY)
Admission: RE | Admit: 2014-11-14 | Discharge: 2014-11-14 | Disposition: A | Discharge status: Home / Self Care | Payer: BLUE CROSS/BLUE SHIELD | Source: Ambulatory Visit | Attending: Emergency Medicine | Admitting: Emergency Medicine

## 2014-11-14 VITALS — BP 136/82 | HR 72 | Temp 98.4°F | Resp 18 | Ht 64.0 in | Wt 231.0 lb

## 2014-11-14 DIAGNOSIS — N8320 Unspecified ovarian cysts: Secondary | ICD-10-CM

## 2014-11-14 DIAGNOSIS — R109 Unspecified abdominal pain: Secondary | ICD-10-CM | POA: Diagnosis not present

## 2014-11-14 DIAGNOSIS — R319 Hematuria, unspecified: Secondary | ICD-10-CM | POA: Insufficient documentation

## 2014-11-14 DIAGNOSIS — M545 Low back pain, unspecified: Secondary | ICD-10-CM

## 2014-11-14 DIAGNOSIS — N83209 Unspecified ovarian cyst, unspecified side: Secondary | ICD-10-CM

## 2014-11-14 LAB — POCT UA - MICROSCOPIC ONLY
Bacteria, U Microscopic: 0
CASTS, UR, LPF, POC: 0
CRYSTALS, UR, HPF, POC: 0
EPITHELIAL CELLS, URINE PER MICROSCOPY: 0
Mucus, UA: 0
RBC, urine, microscopic: 0
WBC, Ur, HPF, POC: 0
YEAST UA: 0

## 2014-11-14 LAB — POCT URINALYSIS DIPSTICK
Bilirubin, UA: NEGATIVE
Glucose, UA: NEGATIVE
KETONES UA: NEGATIVE
Leukocytes, UA: NEGATIVE
Nitrite, UA: NEGATIVE
Protein, UA: NEGATIVE
Spec Grav, UA: 1.01
UROBILINOGEN UA: 0.2
pH, UA: 5.5

## 2014-11-14 MED ORDER — TAMSULOSIN HCL 0.4 MG PO CAPS: 0.4000 mg | ORAL_CAPSULE | Freq: Every day | ORAL | Status: DC

## 2014-11-14 MED ORDER — OXYCODONE-ACETAMINOPHEN 5-325 MG PO TABS: 1.0000 | ORAL_TABLET | ORAL | Status: DC | PRN

## 2014-11-14 MED ORDER — KETOROLAC TROMETHAMINE 60 MG/2ML IM SOLN
60.0000 mg | Freq: Once | INTRAMUSCULAR | Status: AC
  Administered 2014-11-14: 60 mg via INTRAMUSCULAR

## 2014-11-14 NOTE — Patient Instructions (Signed)

## 2014-11-14 NOTE — Addendum Note (Signed)
Addended by: Carmelina DaneANDERSON, JEFFERY S on: 11/14/2014 08:04 PM   Modules accepted: Orders

## 2014-11-14 NOTE — Telephone Encounter (Signed)
ANDERSON - I relayed the information to the patient tonight about her having an ovarian cyst.  She has some more questions in regards to this.  I did tell her also that we were putting in a referral for her to have an ultrasound.  [phone]

## 2014-11-14 NOTE — Addendum Note (Signed)
Addended by: Ihor DowBYRD, Chrisann Melaragno M on: 11/14/2014 05:28 PM   Modules accepted: Orders

## 2014-11-14 NOTE — Progress Notes (Signed)
[facility] [address], [place] [id number] [phone]  Date:  11/14/2014   Name:  Ann KelpWanda [name] Walter   DOB:  06-08-1966   MRN:  [id number]  PCP:  Abbe AmsterdamOPLAND,JESSICA, MD    Chief Complaint: Neck Pain; Back Pain; and Headache   History of Present Illness:  Ann KelpWanda [name] Swilling is a 49 y.o. very pleasant female patient who presents with the following:  Patient has experienced cramping right CVA pain for a week.  Now pain has intensified and is not radiating.   Starting this morning has nausea with no vomiting.  No GU or GYN symptoms No fever or chills.  No history of stones. "slept wrong" on neck and this morning had pain in right neck that is not radiating Inclines head to left.  Now improving. No neuro symptoms or history of trauma   Patient Active Problem List   Diagnosis Date Noted  . Chest pain, atypical 03/08/2014  . Heart murmur 03/08/2014  . Heart palpitations 03/08/2014  . GAD (generalized anxiety disorder) 02/13/2014  . Depression   . HTN (hypertension)     Past Medical History  Diagnosis Date  . Obesity   . UTI (urinary tract infection), uncomplicated   . Depression   . HTN (hypertension)   . Benign hematuria 12/2009    WORKUP WITH DR. NESI WAS NEGATIVE  . Heart murmur   . Anxiety   . Allergy     Past Surgical History  Procedure Laterality Date  . Cholecystectomy  10/2010  . Cystectomy      x 2 from chest    History  Substance Use Topics  . Smoking status: Never Smoker   . Smokeless tobacco: Never Used  . Alcohol Use: No    Family History  Problem Relation Age of Onset  . Diabetes Mother   . Heart disease Mother     HAD TRIPLE BYPASS  . Hypertension Mother   . Kidney disease Father   . Breast cancer Sister   . Breast cancer Maternal Grandmother   . Cancer Maternal Grandmother     LYMPH NODE CANCER  . Colon cancer Neg Hx   . Cancer Maternal Aunt     COLORECTAL     Allergies  Allergen Reactions  .  Wellbutrin [Bupropion]     Throat swelling   . Sulfonamide Derivatives     Medication list has been reviewed and updated.  Current Outpatient Prescriptions on File Prior to Visit  Medication Sig Dispense Refill  . ALPRAZolam (XANAX) 0.5 MG tablet take 1 tablet by mouth three times a day if needed 90 tablet 0  . lisinopril (PRINIVIL,ZESTRIL) 20 MG tablet Take 2 tablets (40 mg total) by mouth daily. 180 tablet 3  . norethindrone (MICRONOR,CAMILA,ERRIN) 0.35 MG tablet TAKE 1 TABLET EVERY DAY 84 tablet 4  . HYDROcodone-acetaminophen (NORCO/VICODIN) 5-325 MG per tablet Take 1 tablet by mouth every 6 (six) hours as needed. (Patient not taking: Reported on 11/14/2014) 30 tablet 0  . ranitidine (ZANTAC) 75 MG tablet Take 75 mg by mouth 2 (two) times daily. Prn     . [DISCONTINUED] norethindrone-ethinyl estradiol (JUNEL FE,GILDESS FE,LOESTRIN FE) 1-20 MG-MCG tablet Take 1 tablet by mouth daily. 1 Package 12   No current facility-administered medications on file prior to visit.    Review of Systems:  As per HPI, otherwise negative.   Physical Examination: Filed Vitals:   11/14/14 1526  BP: 136/82  Pulse: 72  Temp: 98.4 F (36.9  C)  Resp: 18   Filed Vitals:   11/14/14 1526  Height:  (1.626 m)  Weight: 231 lb (104.781 kg)   Body mass index is 39.63 kg/(m^2). Ideal Body Weight: Weight in (lb) to have BMI = 25: 145.3  GEN: obese moderate distress, pacing, Non-toxic, A & O x 3 HEENT: Atraumatic, Normocephalic. Neck supple. No masses, No LAD. Ears and Nose: No external deformity. CV: RRR, No M/G/R. No JVD. No thrill. No extra heart sounds. PULM: CTA B, no wheezes, crackles, rhonchi. No retractions. No resp. distress. No accessory muscle use. ABD: S, NT, ND, +BS. No rebound. No HSM. Right CVA tenderness EXTR: No c/c/e NEURO Normal gait.  PSYCH: Normally interactive. Conversant. Not depressed or anxious appearing.  Calm demeanor.      Assessment and Plan: Kidney  stone toradol  Percocet flowmax CT  Signed,  Phillips Odor, MD   Results for orders placed or performed in visit on 11/14/14  POCT urinalysis dipstick  Result Value Ref Range   Color, UA yellow    Clarity, UA clear    Glucose, UA neg    Bilirubin, UA neg    Ketones, UA neg    Spec Grav, UA 1.010    Blood, UA trace-lysed    pH, UA 5.5    Protein, UA neg    Urobilinogen, UA 0.2    Nitrite, UA neg    Leukocytes, UA Negative   POCT UA - Microscopic Only  Result Value Ref Range   WBC, Ur, HPF, POC 0    RBC, urine, microscopic 0    Bacteria, U Microscopic 0    Mucus, UA 0    Epithelial cells, urine per micros 0    Crystals, Ur, HPF, POC 0    Casts, Ur, LPF, POC 0    Yeast, UA 0

## 2014-11-15 ENCOUNTER — Other Ambulatory Visit: Payer: Self-pay | Admitting: Emergency Medicine

## 2014-11-15 DIAGNOSIS — N83209 Unspecified ovarian cyst, unspecified side: Secondary | ICD-10-CM

## 2014-11-15 NOTE — Telephone Encounter (Signed)
Spoke with pt and answered any questions she had. Pt basically wanted more understanding of her CT results. She is having her US tomorrow.

## 2014-11-16 ENCOUNTER — Ambulatory Visit
Admission: RE | Admit: 2014-11-16 | Discharge: 2014-11-16 | Disposition: A | Discharge status: Home / Self Care | Payer: BLUE CROSS/BLUE SHIELD | Source: Ambulatory Visit | Attending: Emergency Medicine | Admitting: Emergency Medicine

## 2014-11-16 DIAGNOSIS — N83209 Unspecified ovarian cyst, unspecified side: Secondary | ICD-10-CM

## 2014-11-20 ENCOUNTER — Ambulatory Visit: Payer: Managed Care, Other (non HMO) | Admitting: Family Medicine

## 2014-11-27 ENCOUNTER — Ambulatory Visit (INDEPENDENT_AMBULATORY_CARE_PROVIDER_SITE_OTHER): Payer: BLUE CROSS/BLUE SHIELD | Admitting: Family Medicine

## 2014-11-27 VITALS — BP 127/84 | HR 62 | Temp 98.2°F | Resp 16 | Ht 63.5 in | Wt 227.0 lb

## 2014-11-27 DIAGNOSIS — F419 Anxiety disorder, unspecified: Secondary | ICD-10-CM

## 2014-11-27 DIAGNOSIS — F329 Major depressive disorder, single episode, unspecified: Secondary | ICD-10-CM

## 2014-11-27 DIAGNOSIS — Z23 Encounter for immunization: Secondary | ICD-10-CM

## 2014-11-27 DIAGNOSIS — F418 Other specified anxiety disorders: Secondary | ICD-10-CM

## 2014-11-27 DIAGNOSIS — E663 Overweight: Secondary | ICD-10-CM

## 2014-11-27 DIAGNOSIS — F32A Depression, unspecified: Secondary | ICD-10-CM

## 2014-11-27 NOTE — Progress Notes (Signed)
[facility] [address], [place] [address] [phone]  Date:  11/27/2014   Name:  Ann Walter   DOB:  10/07/1966   MRN:  [id number]  PCP:  Abbe Amsterdam, MD    Chief Complaint: form completion   History of Present Illness:  Ann Walter is a 49 y.o. very pleasant female patient who presents with the following:  Here today for a follow-up exam and wellness form for her job.  She had a cholesterol screening less than a year ago.   She was dx with an ovarian cyst recently- she will see her OBG for her annual exam in 3 months and plans to follow-up her cyst then. Her mother died just about a year ago and Ann Walter notes that she is still upset a lot about this.   She will have tearful days, and sometimes does not feel much like social activiteis.  She sleeps well.   She uses xanax as needed but does not feel that she wants to go on anything else for depression She does tend to be concerned about losing her job and being laid off.    She takes her xanax TID and it does help her.  Also lisinopril for her BP   She admits she has not exercised much during the winter, but she plans to get started again soon and hopes to lose some lbs again She uses her OCP continuously    Patient Active Problem List   Diagnosis Date Noted  . Chest pain, atypical 03/08/2014  . Heart murmur 03/08/2014  . Heart palpitations 03/08/2014  . GAD (generalized anxiety disorder) 02/13/2014  . Depression   . HTN (hypertension)     Past Medical History  Diagnosis Date  . Obesity   . UTI (urinary tract infection), uncomplicated   . Depression   . HTN (hypertension)   . Benign hematuria 12/2009    WORKUP WITH DR. NESI WAS NEGATIVE  . Heart murmur   . Anxiety   . Allergy     Past Surgical History  Procedure Laterality Date  . Cholecystectomy  10/2010  . Cystectomy      x 2 from chest    History  Substance Use Topics  . Smoking status: Never Smoker   .  Smokeless tobacco: Never Used  . Alcohol Use: No    Family History  Problem Relation Age of Onset  . Diabetes Mother   . Heart disease Mother     HAD TRIPLE BYPASS  . Hypertension Mother   . Kidney disease Father   . Breast cancer Sister   . Breast cancer Maternal Grandmother   . Cancer Maternal Grandmother     LYMPH NODE CANCER  . Colon cancer Neg Hx   . Cancer Maternal Aunt     COLORECTAL     Allergies  Allergen Reactions  . Wellbutrin [Bupropion]     Throat swelling   . Sulfonamide Derivatives     Medication list has been reviewed and updated.  Current Outpatient Prescriptions on File Prior to Visit  Medication Sig Dispense Refill  . ALPRAZolam (XANAX) 0.5 MG tablet take 1 tablet by mouth three times a day if needed 90 tablet 0  . lisinopril (PRINIVIL,ZESTRIL) 20 MG tablet Take 2 tablets (40 mg total) by mouth daily. 180 tablet 3  . magnesium 30 MG tablet Take 30 mg by mouth 2 (two) times daily.    . norethindrone (MICRONOR,CAMILA,ERRIN) 0.35 MG tablet TAKE 1 TABLET  EVERY DAY 84 tablet 4  . ranitidine (ZANTAC) 75 MG tablet Take 75 mg by mouth 2 (two) times daily. Prn     . [DISCONTINUED] norethindrone-ethinyl estradiol (JUNEL FE,GILDESS FE,LOESTRIN FE) 1-20 MG-MCG tablet Take 1 tablet by mouth daily. 1 Package 12   No current facility-administered medications on file prior to visit.    Review of Systems:  As per HPI- otherwise negative.   Physical Examination: Filed Vitals:   11/27/14 1203  BP: 127/84  Pulse: 62  Temp: 98.2 F (36.8 C)  Resp: 16   Filed Vitals:   11/27/14 1203  Height: 5' 3.5" (1.613 m)  Weight: 227 lb (102.967 kg)   Body mass index is 39.58 kg/(m^2). Ideal Body Weight: Weight in (lb) to have BMI = 25: 143.1  GEN: WDWN, NAD, Non-toxic, A & O x 3, obese, looks well HEENT: Atraumatic, Normocephalic. Neck supple. No masses, No LAD. Ears and Nose: No external deformity. CV: RRR, No M/G/R. No JVD. No thrill. No extra heart  sounds. PULM: CTA B, no wheezes, crackles, rhonchi. No retractions. No resp. distress. No accessory muscle use. EXTR: No c/c/e NEURO Normal gait.  PSYCH: Normally interactive. Conversant. Not depressed or anxious appearing.  Calm demeanor.    Assessment and Plan: Need for immunization against influenza - Plan: Flu Vaccine QUAD 36+ mos IM (Fluarix)  Anxiety and depression  Overweight  Refilled her xanax recently- not due yet.  Completed her form for her, she will see me in 6 months or so   Signed Abbe AmsterdamJessica Copland, MD

## 2014-11-27 NOTE — Patient Instructions (Signed)
Work on getting some more exercise- this will help with your mood as well Please come and see me in about 6 months

## 2014-12-13 ENCOUNTER — Other Ambulatory Visit: Payer: Self-pay | Admitting: Family Medicine

## 2014-12-14 NOTE — Telephone Encounter (Signed)
Please give her #90 alprazolam in my absence.  Thank you!

## 2014-12-14 NOTE — Telephone Encounter (Signed)
Done

## 2014-12-15 NOTE — Telephone Encounter (Signed)
Faxed

## 2015-01-12 ENCOUNTER — Other Ambulatory Visit: Payer: Self-pay | Admitting: Family Medicine

## 2015-01-12 DIAGNOSIS — F411 Generalized anxiety disorder: Secondary | ICD-10-CM

## 2015-01-17 ENCOUNTER — Other Ambulatory Visit: Payer: Self-pay

## 2015-01-17 DIAGNOSIS — Z1239 Encounter for other screening for malignant neoplasm of breast: Secondary | ICD-10-CM

## 2015-02-16 ENCOUNTER — Ambulatory Visit (INDEPENDENT_AMBULATORY_CARE_PROVIDER_SITE_OTHER): Payer: BLUE CROSS/BLUE SHIELD | Admitting: Women's Health

## 2015-02-16 ENCOUNTER — Encounter: Payer: Self-pay | Admitting: Women's Health

## 2015-02-16 ENCOUNTER — Other Ambulatory Visit (HOSPITAL_COMMUNITY)
Admission: RE | Admit: 2015-02-16 | Discharge: 2015-02-16 | Disposition: A | Discharge status: Home / Self Care | Payer: BLUE CROSS/BLUE SHIELD | Source: Ambulatory Visit | Attending: Women's Health | Admitting: Women's Health

## 2015-02-16 ENCOUNTER — Other Ambulatory Visit: Payer: Self-pay | Admitting: Family Medicine

## 2015-02-16 VITALS — BP 136/80 | Ht 63.0 in | Wt 227.0 lb

## 2015-02-16 DIAGNOSIS — Z01419 Encounter for gynecological examination (general) (routine) without abnormal findings: Secondary | ICD-10-CM | POA: Diagnosis not present

## 2015-02-16 DIAGNOSIS — Z1151 Encounter for screening for human papillomavirus (HPV): Secondary | ICD-10-CM | POA: Diagnosis present

## 2015-02-16 DIAGNOSIS — N83202 Unspecified ovarian cyst, left side: Secondary | ICD-10-CM

## 2015-02-16 DIAGNOSIS — Z3041 Encounter for surveillance of contraceptive pills: Secondary | ICD-10-CM | POA: Diagnosis not present

## 2015-02-16 DIAGNOSIS — Z13220 Encounter for screening for lipoid disorders: Secondary | ICD-10-CM

## 2015-02-16 DIAGNOSIS — N8320 Unspecified ovarian cysts: Secondary | ICD-10-CM | POA: Diagnosis not present

## 2015-02-16 LAB — CBC WITH DIFFERENTIAL/PLATELET
Basophils Absolute: 0.1 10*3/uL (ref 0.0–0.1)
Basophils Relative: 1 % (ref 0–1)
Eosinophils Absolute: 0.3 10*3/uL (ref 0.0–0.7)
Eosinophils Relative: 4 % (ref 0–5)
HCT: 37.9 % (ref 36.0–46.0)
Hemoglobin: 13.1 g/dL (ref 12.0–15.0)
LYMPHS ABS: 2.3 10*3/uL (ref 0.7–4.0)
Lymphocytes Relative: 35 % (ref 12–46)
MCH: 30.6 pg (ref 26.0–34.0)
MCHC: 34.6 g/dL (ref 30.0–36.0)
MCV: 88.6 fL (ref 78.0–100.0)
MONO ABS: 0.5 10*3/uL (ref 0.1–1.0)
MPV: 10.2 fL (ref 8.6–12.4)
Monocytes Relative: 7 % (ref 3–12)
NEUTROS ABS: 3.4 10*3/uL (ref 1.7–7.7)
Neutrophils Relative %: 53 % (ref 43–77)
Platelets: 262 10*3/uL (ref 150–400)
RBC: 4.28 MIL/uL (ref 3.87–5.11)
RDW: 14 % (ref 11.5–15.5)
WBC: 6.5 10*3/uL (ref 4.0–10.5)

## 2015-02-16 LAB — COMPREHENSIVE METABOLIC PANEL
ALT: 12 U/L (ref 0–35)
AST: 18 U/L (ref 0–37)
Albumin: 4.3 g/dL (ref 3.5–5.2)
Alkaline Phosphatase: 68 U/L (ref 39–117)
BILIRUBIN TOTAL: 0.6 mg/dL (ref 0.2–1.2)
BUN: 14 mg/dL (ref 6–23)
CALCIUM: 9.6 mg/dL (ref 8.4–10.5)
CO2: 24 mEq/L (ref 19–32)
Chloride: 104 mEq/L (ref 96–112)
Creat: 0.98 mg/dL (ref 0.50–1.10)
Glucose, Bld: 92 mg/dL (ref 70–99)
Potassium: 4.9 mEq/L (ref 3.5–5.3)
SODIUM: 138 meq/L (ref 135–145)
TOTAL PROTEIN: 6.5 g/dL (ref 6.0–8.3)

## 2015-02-16 LAB — LIPID PANEL
CHOLESTEROL: 122 mg/dL (ref 0–200)
HDL: 50 mg/dL (ref >=46)
LDL Cholesterol: 58 mg/dL (ref 0–99)
Total CHOL/HDL Ratio: 2.4 Ratio
Triglycerides: 72 mg/dL (ref <150)
VLDL: 14 mg/dL (ref 0–40)

## 2015-02-16 MED ORDER — NORETHINDRONE 0.35 MG PO TABS: ORAL_TABLET | ORAL | Status: DC

## 2015-02-16 NOTE — Addendum Note (Signed)
Addended by: Kem ParkinsonBARNES, Martavia Tye on: 02/16/2015 11:25 AM   Modules accepted: Orders

## 2015-02-16 NOTE — Patient Instructions (Signed)

## 2015-02-16 NOTE — Progress Notes (Addendum)
[name] 07/18/1966 [id number]    History:    Presents for annual exam.  Rare bleeding on Micronor. Normal Pap and mammogram history. Hypertension managed by primary care. 2013 history of severe depression doing better. 10/2014 had an ultrasound that showed a 4.4 x 4.34.2 cm left ovarian cyst. 2011 benign hematuria.  Past medical history, past surgical history, family history and social history were all reviewed and documented in the EPIC chart. Works [employer] [place]. Has 2 miniature donkeys. 2012 cholecystectomy. Mother diabetes/hypertension/asthma died in January 2015. Stepdaughter Rolly SalterHaley currently living in [place] with her mother.  ROS:  A ROS was performed and pertinent positives and negatives are included.  Exam:  Filed Vitals:   02/16/15 1004  BP: 136/80    General appearance:  Normal Thyroid:  Symmetrical, normal in size, without palpable masses or nodularity. Respiratory  Auscultation:  Clear without wheezing or rhonchi Cardiovascular  Auscultation:  Regular rate, without rubs, murmurs or gallops  Edema/varicosities:  Not grossly evident Abdominal  Soft,nontender, without masses, guarding or rebound.  Liver/spleen:  No organomegaly noted  Hernia:  None appreciated  Skin  Inspection:  Grossly normal   Breasts: Examined lying and sitting.     Right: Without masses, retractions, discharge or axillary adenopathy.     Left: Without masses, retractions, discharge or axillary adenopathy. Gentitourinary   Inguinal/mons:  Normal without inguinal adenopathy  External genitalia:  Normal  BUS/Urethra/Skene's glands:  Normal  Vagina:  Normal  Cervix:  Normal  Uterus:  normal in size, shape and contour.  Midline and mobile  Adnexa/parametria:     Rt: Without masses or tenderness.   Lt: Without masses or tenderness.  Anus and perineum: Normal  Digital rectal exam: Normal sphincter tone without palpated masses or tenderness  Assessment/Plan:  49 y.o. MWF G0 for  annual exam with no complaints.  Hypertension-primary care manages meds Rare bleeding on Micronor Obesity History of depression currently on no medication 10/2014 4.44.34.2 cm left ovarian cyst  Plan: Schedule repeat l ultrasound to assess resolution of eft ovarian cyst.  SBE's, continue annual screening mammogram, calcium rich diet, vitamin D 1000 daily encouraged. Reviewed importance of increasing regular exercise and decreasing calories for weight loss. Micronor prescription, proper use, slight risk for blood clots and strokes reviewed, menopause reviewed. Requested labs to be drawn here, CBC, CMP, lipid panel, UA, Pap with HR HPV typing, Pap normal 2013, new screening guidelines reviewed.     Harrington ChallengerYOUNG,Mahdi Frye J [facility]NP, 10:49 AM 02/16/2015

## 2015-02-17 LAB — URINALYSIS W MICROSCOPIC + REFLEX CULTURE
BACTERIA UA: NONE SEEN
Bilirubin Urine: NEGATIVE
Casts: NONE SEEN
Crystals: NONE SEEN
Glucose, UA: NEGATIVE mg/dL
Hgb urine dipstick: NEGATIVE
Ketones, ur: NEGATIVE mg/dL
Leukocytes, UA: NEGATIVE
NITRITE: NEGATIVE
PH: 6.5 (ref 5.0–8.0)
Protein, ur: NEGATIVE mg/dL
SPECIFIC GRAVITY, URINE: 1.012 (ref 1.005–1.030)
Squamous Epithelial / LPF: NONE SEEN
UROBILINOGEN UA: 0.2 mg/dL (ref 0.0–1.0)

## 2015-02-18 ENCOUNTER — Encounter: Payer: Self-pay | Admitting: Women's Health

## 2015-02-19 ENCOUNTER — Other Ambulatory Visit: Payer: Self-pay | Admitting: Family Medicine

## 2015-02-19 LAB — CYTOLOGY - PAP

## 2015-02-28 ENCOUNTER — Encounter: Payer: Self-pay | Admitting: Women's Health

## 2015-02-28 ENCOUNTER — Other Ambulatory Visit: Payer: Self-pay | Admitting: Women's Health

## 2015-02-28 ENCOUNTER — Ambulatory Visit (INDEPENDENT_AMBULATORY_CARE_PROVIDER_SITE_OTHER): Payer: BLUE CROSS/BLUE SHIELD | Admitting: Women's Health

## 2015-02-28 ENCOUNTER — Ambulatory Visit (INDEPENDENT_AMBULATORY_CARE_PROVIDER_SITE_OTHER): Payer: BLUE CROSS/BLUE SHIELD

## 2015-02-28 DIAGNOSIS — N838 Other noninflammatory disorders of ovary, fallopian tube and broad ligament: Secondary | ICD-10-CM

## 2015-02-28 DIAGNOSIS — R102 Pelvic and perineal pain: Secondary | ICD-10-CM

## 2015-02-28 DIAGNOSIS — N83202 Unspecified ovarian cyst, left side: Secondary | ICD-10-CM

## 2015-02-28 DIAGNOSIS — R1030 Lower abdominal pain, unspecified: Secondary | ICD-10-CM | POA: Diagnosis not present

## 2015-02-28 DIAGNOSIS — N839 Noninflammatory disorder of ovary, fallopian tube and broad ligament, unspecified: Secondary | ICD-10-CM | POA: Diagnosis not present

## 2015-02-28 DIAGNOSIS — N8320 Unspecified ovarian cysts: Secondary | ICD-10-CM

## 2015-02-28 DIAGNOSIS — N83209 Unspecified ovarian cyst, unspecified side: Secondary | ICD-10-CM | POA: Insufficient documentation

## 2015-02-28 MED ORDER — IBUPROFEN 600 MG PO TABS: 600.0000 mg | ORAL_TABLET | Freq: Three times a day (TID) | ORAL | Status: DC | PRN

## 2015-02-28 NOTE — Progress Notes (Signed)
Patient ID: Ann Walter [name] Walter, female   DOB: [birth year], 49 y.o.   MRN: [id number] Resents for follow-up ultrasound. 10/2014 was having left lower quadrant pain was found to have a left ovarian cyst 4.4 x 4.3 x 4.2 cm on ultrasound. States continues to have some pain weekly to every other week sharp in nature, mostly in the lower back sacral area, has not missed work, able to sleep. Denies urinary symptoms or fever or vaginal discharge. Micronor/ amenorrhea  Exam: Appears well. Ultrasound: Transvaginal anteverted uterus homogeneous. Right ovary normal. Left ovary thin-walled solid cystic mass avascular, reticular echo pattern with numerous septations in cystic area of mass 37 x 28 x 34 mm mean 33 mm. Previous ultrasound 40 mm. Decrease slightly in size, internal low level echoes, left ovary  arterial blood flow seen. Negative cul-de-sac.  Left ovarian cyst, smaller  Plan: Repeat ultrasound in 3-4 months, continue Micronor, Motrin 600 every 8 hours when necessary for pain.

## 2015-02-28 NOTE — Patient Instructions (Signed)
Ovarian Cyst An ovarian cyst is a fluid-filled sac that forms on an ovary. The ovaries are small organs that produce eggs in women. Various types of cysts can form on the ovaries. Most are not cancerous. Many do not cause problems, and they often go away on their own. Some may cause symptoms and require treatment. Common types of ovarian cysts include:  Functional cysts--These cysts may occur every month during the menstrual cycle. This is normal. The cysts usually go away with the next menstrual cycle if the woman does not get pregnant. Usually, there are no symptoms with a functional cyst.  Endometrioma cysts--These cysts form from the tissue that lines the uterus. They are also called "chocolate cysts" because they become filled with blood that turns brown. This type of cyst can cause pain in the lower abdomen during intercourse and with your menstrual period.  Cystadenoma cysts--This type develops from the cells on the outside of the ovary. These cysts can get very big and cause lower abdomen pain and pain with intercourse. This type of cyst can twist on itself, cut off its blood supply, and cause severe pain. It can also easily rupture and cause a lot of pain.  Dermoid cysts--This type of cyst is sometimes found in both ovaries. These cysts may contain different kinds of body tissue, such as skin, teeth, hair, or cartilage. They usually do not cause symptoms unless they get very big.  Theca lutein cysts--These cysts occur when too much of a certain hormone (human chorionic gonadotropin) is produced and overstimulates the ovaries to produce an egg. This is most common after procedures used to assist with the conception of a baby (in vitro fertilization). CAUSES   Fertility drugs can cause a condition in which multiple large cysts are formed on the ovaries. This is called ovarian hyperstimulation syndrome.  A condition called polycystic ovary syndrome can cause hormonal imbalances that can lead to  nonfunctional ovarian cysts. SIGNS AND SYMPTOMS  Many ovarian cysts do not cause symptoms. If symptoms are present, they may include:  Pelvic pain or pressure.  Pain in the lower abdomen.  Pain during sexual intercourse.  Increasing girth (swelling) of the abdomen.  Abnormal menstrual periods.  Increasing pain with menstrual periods.  Stopping having menstrual periods without being pregnant. DIAGNOSIS  These cysts are commonly found during a routine or annual pelvic exam. Tests may be ordered to find out more about the cyst. These tests may include:  Ultrasound.  X-ray of the pelvis.  CT scan.  MRI.  Blood tests. TREATMENT  Many ovarian cysts go away on their own without treatment. Your health care provider may want to check your cyst regularly for 2-3 months to see if it changes. For women in menopause, it is particularly important to monitor a cyst closely because of the higher rate of ovarian cancer in menopausal women. When treatment is needed, it may include any of the following:  A procedure to drain the cyst (aspiration). This may be done using a long needle and ultrasound. It can also be done through a laparoscopic procedure. This involves using a thin, lighted tube with a tiny camera on the end (laparoscope) inserted through a small incision.  Surgery to remove the whole cyst. This may be done using laparoscopic surgery or an open surgery involving a larger incision in the lower abdomen.  Hormone treatment or birth control pills. These methods are sometimes used to help dissolve a cyst. HOME CARE INSTRUCTIONS   Only take over-the-counter   or prescription medicines as directed by your health care provider.  Follow up with your health care provider as directed.  Get regular pelvic exams and Pap tests. SEEK MEDICAL CARE IF:   Your periods are late, irregular, or painful, or they stop.  Your pelvic pain or abdominal pain does not go away.  Your abdomen becomes  larger or swollen.  You have pressure on your bladder or trouble emptying your bladder completely.  You have pain during sexual intercourse.  You have feelings of fullness, pressure, or discomfort in your stomach.  You lose weight for no apparent reason.  You feel generally ill.  You become constipated.  You lose your appetite.  You develop acne.  You have an increase in body and facial hair.  You are gaining weight, without changing your exercise and eating habits.  You think you are pregnant. SEEK IMMEDIATE MEDICAL CARE IF:   You have increasing abdominal pain.  You feel sick to your stomach (nauseous), and you throw up (vomit).  You develop a fever that comes on suddenly.  You have abdominal pain during a bowel movement.  Your menstrual periods become heavier than usual. MAKE SURE YOU:  Understand these instructions.  Will watch your condition.  Will get help right away if you are not doing well or get worse. Document Released: 10/13/2005 Document Revised: 10/18/2013 Document Reviewed: 06/20/2013 ExitCare Patient Information 2015 [employer]. This information is not intended to replace advice given to you by your health care provider. Make sure you discuss any questions you have with your health care provider.  

## 2015-03-02 ENCOUNTER — Ambulatory Visit
Admission: RE | Admit: 2015-03-02 | Discharge: 2015-03-02 | Disposition: A | Discharge status: Home / Self Care | Payer: BLUE CROSS/BLUE SHIELD | Source: Ambulatory Visit

## 2015-03-02 ENCOUNTER — Other Ambulatory Visit: Payer: Self-pay

## 2015-03-02 DIAGNOSIS — Z1231 Encounter for screening mammogram for malignant neoplasm of breast: Secondary | ICD-10-CM

## 2015-03-08 ENCOUNTER — Ambulatory Visit (INDEPENDENT_AMBULATORY_CARE_PROVIDER_SITE_OTHER): Payer: BLUE CROSS/BLUE SHIELD | Admitting: Physician Assistant

## 2015-03-08 VITALS — BP 145/87 | HR 62 | Temp 98.9°F | Resp 12 | Ht 63.2 in | Wt 227.0 lb

## 2015-03-08 DIAGNOSIS — L237 Allergic contact dermatitis due to plants, except food: Secondary | ICD-10-CM | POA: Diagnosis not present

## 2015-03-08 DIAGNOSIS — M545 Low back pain, unspecified: Secondary | ICD-10-CM

## 2015-03-08 MED ORDER — CETIRIZINE HCL 10 MG PO TABS: 10.0000 mg | ORAL_TABLET | Freq: Every day | ORAL | Status: DC

## 2015-03-08 MED ORDER — HYDROXYZINE HCL 25 MG PO TABS: 12.5000 mg | ORAL_TABLET | Freq: Every day | ORAL | Status: DC

## 2015-03-08 MED ORDER — PREDNISONE 20 MG PO TABS: ORAL_TABLET | ORAL | Status: DC

## 2015-03-08 MED ORDER — MELOXICAM 15 MG PO TABS: 15.0000 mg | ORAL_TABLET | Freq: Every day | ORAL | Status: DC

## 2015-03-08 NOTE — Patient Instructions (Signed)

## 2015-03-08 NOTE — Progress Notes (Signed)
Subjective:    Patient ID: Ann SettersWanda D Swaby, female    DOB: 03-21-1966, 49 y.o.   MRN: [id number]  HPI Patient presents for poison ivy and lower back pain.   Poison ivy has been present on face, ears, neck, and back of past 2 days then accidentally touched groin and eyes when applying calamine lotion so has spread there. Has two mini donkeys at home that got into poison ivy in yard and noticed rash after hugging and playing with them. Rash itches, but is not painful. Has  No surrounding erythema. Denies SOB or wheezing.   Right-sided lower back pain has been present for past 2 weeks. Pain does not radiate and is burning in quality. Has been walking daily for past month to lose weight and shoes are relatively worn down. Has left-sided ovarian cyst that has been getting smaller so does not think it is related. Also states that she fell last year and back was painful for a while, but that resolved. Denies loss of function/ROM/sensation, weakness, or numbness of back or lower extremities. Denies incontinence. Denies h/o scoliosis or arthritis.   Review of Systems  Constitutional: Negative.   Eyes: Positive for itching. Negative for photophobia, pain, discharge, redness and visual disturbance.  Respiratory: Negative for shortness of breath and wheezing.   Cardiovascular: Negative for chest pain.  Gastrointestinal: Negative for abdominal pain, diarrhea and constipation.  Genitourinary: Negative.   Musculoskeletal: Positive for myalgias and back pain. Negative for joint swelling, arthralgias, gait problem, neck pain and neck stiffness.  Skin: Positive for rash.  Neurological: Negative for weakness and numbness.       Objective:   Physical Exam  Constitutional: She is oriented to person, place, and time. She appears well-developed and well-nourished. No distress.  Blood pressure 145/87, pulse 62, temperature 98.9 F (37.2 C), temperature source Oral, resp. rate 12, height 5' 3.2" (1.605 m),  weight 227 lb (102.967 kg).  HENT:  Head: Normocephalic and atraumatic. Head is without right periorbital erythema and without left periorbital erythema.  Right Ear: External ear normal.  Left Ear: External ear normal.  Eyes: Conjunctivae are normal. Pupils are equal, round, and reactive to light. Right eye exhibits no chemosis, no discharge, no exudate and no hordeolum. No foreign body present in the right eye. Left eye exhibits no chemosis, no discharge, no exudate and no hordeolum. No foreign body present in the left eye. No scleral icterus.  Cardiovascular: Normal rate, regular rhythm and normal heart sounds.  Exam reveals no gallop and no friction rub.   No murmur heard. Pulmonary/Chest: Effort normal and breath sounds normal. No respiratory distress. She has no wheezes. She has no rales.  Abdominal: Soft. Bowel sounds are normal. She exhibits no distension. There is no tenderness. There is no rebound and no guarding.  Musculoskeletal: Normal range of motion. She exhibits no edema or tenderness.       Cervical back: Normal.       Thoracic back: Normal.       Lumbar back: She exhibits pain (right). She exhibits normal range of motion, no bony tenderness, no swelling, no edema, no deformity and no laceration.  Neurological: She is alert and oriented to person, place, and time. She has normal strength and normal reflexes. She displays no atrophy. No cranial nerve deficit or sensory deficit. She exhibits normal muscle tone. Coordination normal.  Skin: Skin is warm and dry. Rash (Upper eyelids, cheeks, neck, 2 spots on back, and right groin.) noted. She is  not diaphoretic. No erythema. No pallor.  Psychiatric: She has a normal mood and affect. Her behavior is normal. Judgment and thought content normal.      Assessment & Plan:  1. Poison ivy dermatitis Can continue calamine lotion or can use cortisone cream for additional relief. Make sure to wash hands after application. Should replace make-up  since patient put make-up on top of poison ivy on face.  - hydrOXYzine (ATARAX/VISTARIL) 25 MG tablet; Take 0.5-1 tablets (12.5-25 mg total) by mouth at bedtime.  Dispense: 30 tablet; Refill: 0 - cetirizine (ZYRTEC) 10 MG tablet; Take 1 tablet (10 mg total) by mouth daily.  Dispense: 30 tablet; Refill: 11 - predniSONE (DELTASONE) 20 MG tablet; Take 3 PO QAM x3days, 2 PO QAM x3days, 1 PO QAM x3days  Dispense: 18 tablet; Refill: 0  2. Right-sided low back pain without sciatica Back exercises should be done daily. Ice/cold pack on affected area 3-4x daily for 15-20 minutes. - meloxicam (MOBIC) 15 MG tablet; Take 1 tablet (15 mg total) by mouth daily.  Dispense: 30 tablet; Refill: 1   Kevontay Burks PA-C  [facility] 03/08/2015 9:32 AM

## 2015-04-19 ENCOUNTER — Other Ambulatory Visit: Payer: Self-pay | Admitting: Family Medicine

## 2015-05-18 ENCOUNTER — Other Ambulatory Visit: Payer: Self-pay | Admitting: Family Medicine

## 2015-06-06 ENCOUNTER — Ambulatory Visit (INDEPENDENT_AMBULATORY_CARE_PROVIDER_SITE_OTHER): Payer: BLUE CROSS/BLUE SHIELD | Admitting: Women's Health

## 2015-06-06 ENCOUNTER — Ambulatory Visit (INDEPENDENT_AMBULATORY_CARE_PROVIDER_SITE_OTHER): Payer: BLUE CROSS/BLUE SHIELD

## 2015-06-06 ENCOUNTER — Encounter: Payer: Self-pay | Admitting: Women's Health

## 2015-06-06 VITALS — BP 128/80 | Ht 63.0 in | Wt 227.0 lb

## 2015-06-06 DIAGNOSIS — N8320 Unspecified ovarian cysts: Secondary | ICD-10-CM | POA: Diagnosis not present

## 2015-06-06 DIAGNOSIS — N83202 Unspecified ovarian cyst, left side: Secondary | ICD-10-CM

## 2015-06-06 NOTE — Progress Notes (Signed)
Patient ID: Ann Walter, female   DOB: [birth year], 49 y.o.   MRN: [id number] Presents for follow-up ultrasound. January 2016 had a left ovarian 4 cm cyst, 02/2015 cyst was 37 x 28 x 30 433 and millimeters mean with solid cystic components. Has  some intermittent lower abdominal discomfort/pressure. OCs continuously. Denies fever, urinary symptoms or vaginal discharge. 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   Exam: Appears well. Ultrasound: T/V anteverted uterus no change prior ultrasound exam. Right ovary thin-walled echo-free follicle 14 x 18 x 14 mm. Left ovary continued presence of a thin-walled cystic mass reticular echo pattern 19 x 21 x 20 mm, decreased in size from previous ultrasound. Internal low level echoes. Negative CFD. Arterial blood flow to left ovary. Negative cul-de-sac.  Left ovarian cyst resolving Obesity  Plan: Reviewed left ovarian cyst has decreased in size, continue OCs continuously. Encouraged to increase regular exercise and decrease calories for weight loss which may help decrease abdominal pressure.

## 2015-06-20 ENCOUNTER — Other Ambulatory Visit: Payer: Self-pay | Admitting: Family Medicine

## 2015-07-03 ENCOUNTER — Ambulatory Visit (INDEPENDENT_AMBULATORY_CARE_PROVIDER_SITE_OTHER): Payer: BLUE CROSS/BLUE SHIELD | Admitting: Family Medicine

## 2015-07-03 VITALS — BP 138/84 | HR 67 | Temp 98.8°F | Resp 16 | Ht 63.0 in | Wt 231.4 lb

## 2015-07-03 DIAGNOSIS — F411 Generalized anxiety disorder: Secondary | ICD-10-CM

## 2015-07-03 DIAGNOSIS — Z23 Encounter for immunization: Secondary | ICD-10-CM

## 2015-07-03 NOTE — Patient Instructions (Signed)
Good to see you today- take care  Let me know if your left shoulder is not continuing to improve Do some "wall crawls" with your fingers to maintain your shoulder range of motion  I am sorry that you had this fall- please let me know if you don't continue getting your strength back Assuming all is well please see me in about 4-6 months  

## 2015-07-03 NOTE — Progress Notes (Signed)
[facility] [address], [place] [address] [phone]  Date:  07/03/2015   Name:  Ann Walter   DOB:  July 31, 1966   MRN:  [id number]  PCP:  Abbe Amsterdam, MD    Chief Complaint: Follow-up   History of Present Illness:  Ann Walter is a 49 y.o. very pleasant female patient who presents with the following:  Here today to follow-up and also to get her flu shot.  She last had labs in April of this year.   She has noted a "purple area in my fingers that busts or goes back down and then the pain goes away."  This only occurs on her right hand. She does not have it right now; lasts for just a few minutes.  Wonders what this might be.    She continues to use xanax TID- her dose is stable and doing ok for her Her mother did die a year ago which continues to be hard on her She is sleeping ok Work is a major stressor.  She does not feel that she is depressed but does feel angry at times Things at home are better- her stepdaughter is living with her mother which is a relief.  Also the girl seems to be thriving now which is also good news  LMP - she is on continuous OCP; per her OBG  BP Readings from Last 3 Encounters:  07/03/15 138/84  06/06/15 128/80  03/08/15 145/87     Patient Active Problem List   Diagnosis Date Noted  . Ovarian cyst 02/28/2015  . Chest pain, atypical 03/08/2014  . Heart murmur 03/08/2014  . Heart palpitations 03/08/2014  . GAD (generalized anxiety disorder) 02/13/2014  . Depression   . HTN (hypertension)     Past Medical History  Diagnosis Date  . Obesity   . UTI (urinary tract infection), uncomplicated   . Depression   . HTN (hypertension)   . Benign hematuria 12/2009    WORKUP WITH DR. NESI WAS NEGATIVE  . Heart murmur   . Allergy     Past Surgical History  Procedure Laterality Date  . Cholecystectomy  10/2010  . Cystectomy      x 2 from chest    Social History  Substance Use Topics  . Smoking status: Never  Smoker   . Smokeless tobacco: Never Used  . Alcohol Use: No    Family History  Problem Relation Age of Onset  . Diabetes Mother   . Heart disease Mother     HAD TRIPLE BYPASS  . Hypertension Mother   . Kidney disease Father   . Breast cancer Sister   . Breast cancer Maternal Grandmother   . Cancer Maternal Grandmother     LYMPH NODE CANCER  . Colon cancer Neg Hx   . Cancer Maternal Aunt     COLORECTAL     Allergies  Allergen Reactions  . Wellbutrin [Bupropion]     Throat swelling   . Sulfonamide Derivatives     Medication list has been reviewed and updated.  Current Outpatient Prescriptions on File Prior to Visit  Medication Sig Dispense Refill  . ALPRAZolam (XANAX) 0.5 MG tablet take 1 tablet by mouth three times a day if needed 90 tablet 0  . cetirizine (ZYRTEC) 10 MG tablet Take 1 tablet (10 mg total) by mouth daily. 30 tablet 11  . ibuprofen (ADVIL,MOTRIN) 600 MG tablet Take 1 tablet (600 mg total) by mouth every 8 (eight) hours  as needed. 60 tablet 1  . lisinopril (PRINIVIL,ZESTRIL) 20 MG tablet take 2 tablets by mouth once daily (Patient taking differently: take 1 tablet by mouth once daily) 180 tablet 0  . magnesium 30 MG tablet Take 60 mg by mouth 2 (two) times daily.     . meloxicam (MOBIC) 15 MG tablet Take 1 tablet (15 mg total) by mouth daily. 30 tablet 1  . norethindrone (MICRONOR,CAMILA,ERRIN) 0.35 MG tablet TAKE 1 TABLET EVERY DAY 84 tablet 4  . ranitidine (ZANTAC) 75 MG tablet Take 75 mg by mouth daily. Prn     . [DISCONTINUED] norethindrone-ethinyl estradiol (JUNEL FE,GILDESS FE,LOESTRIN FE) 1-20 MG-MCG tablet Take 1 tablet by mouth daily. 1 Package 12   No current facility-administered medications on file prior to visit.    Review of Systems:  As per HPI- otherwise negative.   Physical Examination: Filed Vitals:   07/03/15 1252  BP: 138/84  Pulse: 67  Temp: 98.8 F (37.1 C)  Resp: 16   Filed Vitals:   07/03/15 1252  Height:  (1.6  m)  Weight: 231 lb 6 oz (104.951 kg)   Body mass index is 41 kg/(m^2). Ideal Body Weight: Weight in (lb) to have BMI = 25: 140.8  GEN: WDWN, NAD, Non-toxic, A & O x 3, obese, looks well HEENT: Atraumatic, Normocephalic. Neck supple. No masses, No LAD. Ears and Nose: No external deformity. CV: RRR, No M/G/R. No JVD. No thrill. No extra heart sounds. PULM: CTA B, no wheezes, crackles, rhonchi. No retractions. No resp. distress. No accessory muscle use.Marland Kitchen EXTR: No c/c/e NEURO Normal gait.  PSYCH: Normally interactive. Conversant. Not depressed or anxious appearing.  Calm demeanor.    Assessment and Plan: GAD (generalized anxiety disorder)  Immunization due - Plan: Flu Vaccine QUAD 36+ mos IM  Monitoring visit for her chronic xanax- she is doing well, will continue to use as usual Flu shot  Signed Abbe Amsterdam, MD

## 2015-07-20 ENCOUNTER — Other Ambulatory Visit: Payer: Self-pay | Admitting: Family Medicine

## 2015-07-23 ENCOUNTER — Encounter: Payer: Self-pay | Admitting: Internal Medicine

## 2015-08-29 ENCOUNTER — Other Ambulatory Visit: Payer: Self-pay | Admitting: Women's Health

## 2015-09-16 ENCOUNTER — Other Ambulatory Visit: Payer: Self-pay | Admitting: Family Medicine

## 2015-11-06 ENCOUNTER — Ambulatory Visit (INDEPENDENT_AMBULATORY_CARE_PROVIDER_SITE_OTHER): Payer: BLUE CROSS/BLUE SHIELD

## 2015-11-06 ENCOUNTER — Ambulatory Visit (INDEPENDENT_AMBULATORY_CARE_PROVIDER_SITE_OTHER): Payer: BLUE CROSS/BLUE SHIELD | Admitting: Internal Medicine

## 2015-11-06 VITALS — BP 148/100 | HR 77 | Temp 98.4°F | Resp 18 | Ht 63.5 in | Wt 223.6 lb

## 2015-11-06 DIAGNOSIS — R0789 Other chest pain: Secondary | ICD-10-CM

## 2015-11-06 DIAGNOSIS — J988 Other specified respiratory disorders: Secondary | ICD-10-CM | POA: Diagnosis not present

## 2015-11-06 DIAGNOSIS — R05 Cough: Secondary | ICD-10-CM | POA: Diagnosis not present

## 2015-11-06 DIAGNOSIS — R059 Cough, unspecified: Secondary | ICD-10-CM

## 2015-11-06 DIAGNOSIS — J4520 Mild intermittent asthma, uncomplicated: Secondary | ICD-10-CM

## 2015-11-06 DIAGNOSIS — J22 Unspecified acute lower respiratory infection: Secondary | ICD-10-CM

## 2015-11-06 MED ORDER — PREDNISONE 20 MG PO TABS: ORAL_TABLET | ORAL | Status: DC

## 2015-11-06 MED ORDER — HYDROCODONE-HOMATROPINE 5-1.5 MG/5ML PO SYRP
5.0000 mL | ORAL_SOLUTION | Freq: Four times a day (QID) | ORAL | Status: DC | PRN
Start: 2015-11-06 — End: 2015-11-07

## 2015-11-06 MED ORDER — AZITHROMYCIN 250 MG PO TABS: ORAL_TABLET | ORAL | Status: DC

## 2015-11-06 NOTE — Progress Notes (Signed)
Cardiology Office Note    Date:  11/06/2015   ID:  Ann Walter, DOB 18-Apr-1966, MRN [id number]  PCP:  Abbe Amsterdam, MD  Cardiologist:  Dr. Armanda Magic   Electrophysiologist:  n/a  No chief complaint on file.   History of Present Illness:  Ann Walter is a 50 y.o. female with a hx of HTN, chest pain. Records indicate she has a hx of remote cardiac cath with normal coronary arteries.  Evaluated by Dr. Armanda Magic 5/15     Past Medical History  Diagnosis Date  . Obesity   . UTI (urinary tract infection), uncomplicated   . Depression   . HTN (hypertension)   . Benign hematuria 12/2009    WORKUP WITH DR. NESI WAS NEGATIVE  . Heart murmur   . Allergy     Past Surgical History  Procedure Laterality Date  . Cholecystectomy  10/2010  . Cystectomy      x 2 from chest    Current Outpatient Prescriptions  Medication Sig Dispense Refill  . ALPRAZolam (XANAX) 0.5 MG tablet take 1 tablet by mouth three times a day if needed 90 tablet 1  . azithromycin (ZITHROMAX) 250 MG tablet As packaged 6 tablet 0  . cetirizine (ZYRTEC) 10 MG tablet Take 1 tablet (10 mg total) by mouth daily. (Patient not taking: Reported on 11/06/2015) 30 tablet 11  . HYDROcodone-homatropine (HYCODAN) 5-1.5 MG/5ML syrup Take 5 mLs by mouth every 6 (six) hours as needed. 120 mL 0  . ibuprofen (ADVIL,MOTRIN) 600 MG tablet take 1 tablet by mouth every 8 hours if needed 60 tablet 2  . lisinopril (PRINIVIL,ZESTRIL) 20 MG tablet take 2 tablets by mouth once daily (Patient taking differently: take 1 tablet by mouth once daily) 180 tablet 0  . magnesium 30 MG tablet Take 60 mg by mouth 2 (two) times daily.     . meloxicam (MOBIC) 15 MG tablet Take 1 tablet (15 mg total) by mouth daily. 30 tablet 1  . norethindrone (MICRONOR,CAMILA,ERRIN) 0.35 MG tablet TAKE 1 TABLET EVERY DAY 84 tablet 4  . predniSONE (DELTASONE) 20 MG tablet 3/3/2/2/1/1 single daily dose for 6 days 12 tablet 0  . ranitidine (ZANTAC) 75 MG  tablet Take 75 mg by mouth daily. Reported on 11/06/2015    . [DISCONTINUED] norethindrone-ethinyl estradiol (JUNEL FE,GILDESS FE,LOESTRIN FE) 1-20 MG-MCG tablet Take 1 tablet by mouth daily. 1 Package 12   No current facility-administered medications for this visit.    Allergies:   Wellbutrin and Sulfonamide derivatives   Social History   Social History  . Marital Status: Married    Spouse Name: N/A  . Number of Children: 0  . Years of Education: N/A   Occupational History  . [employer] [place]  . [employer] [place]   Social History Main Topics  . Smoking status: Never Smoker   . Smokeless tobacco: Never Used  . Alcohol Use: No  . Drug Use: No  . Sexual Activity:    Partners: Male    Birth Control/ Protection: Pill     Comment: INTERCOURSE AGE 61, SEXUAL PARTNERS MORE THAN 5   Other Topics Concern  . Not on file   Social History Narrative   Married with no children     Family History:  The patient's family history includes Breast cancer in her maternal grandmother and sister; Cancer in her maternal aunt and maternal grandmother; Diabetes in her mother; Heart disease in her mother; Hypertension in her mother;  Kidney disease in her father. There is no history of Colon cancer.   ROS:   Please see the history of present illness.    ROS All other systems reviewed and are negative.   PHYSICAL EXAM:   VS:  There were no vitals taken for this visit.   GEN: Well nourished, well developed, in no acute distress HEENT: normal Neck: no JVD, no masses Cardiac: Normal S1/S2, RRR; no murmurs, rubs, or gallops, no edema;   carotid bruits,   Respiratory:  clear to auscultation bilaterally; no wheezing, rhonchi or rales GI: soft, nontender, nondistended, + BS MS: no deformity or atrophy Skin: warm and dry, no rash Neuro:  Bilateral strength equal, no focal deficits  Psych: Alert and oriented x 3, normal affect  Wt Readings from Last 3 Encounters:    11/06/15 223 lb 9.6 oz (101.424 kg)  07/03/15 231 lb 6 oz (104.951 kg)  06/06/15 227 lb (102.967 kg)      Studies/Labs Reviewed:   EKG:  EKG is  ordered today.  The ekg ordered today demonstrates   Recent Labs: 02/16/2015: ALT 12; BUN 14; Creat 0.98; Hemoglobin 13.1; Platelets 262; Potassium 4.9; Sodium 138   Recent Lipid Panel    Component Value Date/Time   CHOL 122 02/16/2015 1044   TRIG 72 02/16/2015 1044   HDL 50 02/16/2015 1044   CHOLHDL 2.4 02/16/2015 1044   VLDL 14 02/16/2015 1044   LDLCALC 58 02/16/2015 1044    Additional studies/ records that were reviewed today include:   ETT 6/15 Normal  Echo 6/15 EF 55-60%, no RWMA, normal diastolic function, trivial MR, mild TR  Event monitor 5/15 NSR   ASSESSMENT:    No diagnosis found.  PLAN:  In order of problems listed above:  1.    Medication Adjustments/Labs and Tests Ordered: Current medicines are reviewed at length with the patient today.  Concerns regarding medicines are outlined above.  Medication changes, Labs and Tests ordered today are outlined in the Patient Instructions noted below.  Signed, Tereso NewcomerScott Weaver, PA-C  11/06/2015 4:56 PM    [facility] [address], [place], [place]  [address] Phone: [phone]; Fax: [phone]   There are no Patient Instructions on file for this visit. This encounter was created in error - please disregard.

## 2015-11-06 NOTE — Progress Notes (Addendum)
Subjective:  By signing my name below, I, Ann Walter, attest that this documentation has been prepared under the direction and in the presence of Ellamae Sia, MD.  Ann Walter, Medical Scribe. 11/06/2015.  1:01 PM.   Patient ID: Ann Walter, female    DOB: 1966-07-04, 50 y.o.   MRN: [id number]  Chief Complaint  Patient presents with  . Cough    x  1 week - Lungs and chest hurts when coughing x 2 days  . Shortness of Breath  . Wheezing  . Nasal Congestion    runny nose   HPI HPI Comments: Ann Walter is a 50 y.o. female who presents to [facility] complaining of wheezing, gradual onset 1 week ago.  Pt reports that her symptoms have improved inititally since onset, however have worsened over the past 3 days. She notes severe chest wall pain with coughing. She indicates having associated symptoms of resolved sore throat, rhinorrhea, nasal congestion, and shortness of breath.   Has had RAD in past per dr copland Patient Active Problem List   Diagnosis Date Noted  . Ovarian cyst 02/28/2015  . Chest pain, atypical 03/08/2014  . Heart murmur 03/08/2014  . Heart palpitations 03/08/2014  . GAD (generalized anxiety disorder) 02/13/2014  . Depression   . HTN (hypertension)    Past Medical History  Diagnosis Date  . Obesity   . UTI (urinary tract infection), uncomplicated   . Depression   . HTN (hypertension)   . Benign hematuria 12/2009    WORKUP WITH DR. NESI WAS NEGATIVE  . Heart murmur   . Allergy    Past Surgical History  Procedure Laterality Date  . Cholecystectomy  10/2010  . Cystectomy      x 2 from chest   Allergies  Allergen Reactions  . Wellbutrin [Bupropion]     Throat swelling   . Sulfonamide Derivatives    Prior to Admission medications   Medication Sig Start Date End Date Taking? Authorizing Provider  ALPRAZolam Prudy Feeler) 0.5 MG tablet take 1 tablet by mouth three times a day if needed 09/17/15  Yes Gwenlyn Found Copland,  MD  ibuprofen (ADVIL,MOTRIN) 600 MG tablet take 1 tablet by mouth every 8 hours if needed 08/30/15  Yes Harrington Challenger, NP  lisinopril (PRINIVIL,ZESTRIL) 20 MG tablet take 2 tablets by mouth once daily Patient taking differently: take 1 tablet by mouth once daily 02/16/15  Yes Gwenlyn Found Copland, MD  magnesium 30 MG tablet Take 60 mg by mouth 2 (two) times daily.    Yes Historical Provider, MD  norethindrone (MICRONOR,CAMILA,ERRIN) 0.35 MG tablet TAKE 1 TABLET EVERY DAY 02/16/15  Yes Harrington Challenger, NP  cetirizine (ZYRTEC) 10 MG tablet Take 1 tablet (10 mg total) by mouth daily. Patient not taking: Reported on 11/06/2015 03/08/15   Tishira R Brewington, PA-C  meloxicam (MOBIC) 15 MG tablet Take 1 tablet (15 mg total) by mouth daily. 03/08/15   Tishira R Brewington, PA-C  ranitidine (ZANTAC) 75 MG tablet Take 75 mg by mouth daily. Reported on 11/06/2015    Historical Provider, MD   Social History   Social History  . Marital Status: Married    Spouse Name: N/A  . Number of Children: 0  . Years of Education: N/A   Occupational History  . [employer] [place]  . [employer] [place]   Social History Main Topics  . Smoking status: Never Smoker   . Smokeless  tobacco: Never Used  . Alcohol Use: No  . Drug Use: No  . Sexual Activity:    Partners: Male    Birth Control/ Protection: Pill     Comment: INTERCOURSE AGE 36, SEXUAL PARTNERS MORE THAN 5   Other Topics Concern  . Not on file   Social History Narrative   Married with no children    Review of Systems  Constitutional: Positive for chills, activity change, appetite change and fatigue. Negative for fever.  HENT: Positive for congestion, rhinorrhea and sore throat.   Eyes: Negative for visual disturbance.  Respiratory: Positive for cough, chest tightness, shortness of breath and wheezing.   Cardiovascular: Negative for palpitations and leg swelling.  Gastrointestinal: Negative for abdominal pain.      Objective:     Physical Exam  Constitutional: She is oriented to person, place, and time. She appears well-developed and well-nourished. No distress.  HENT:  Head: Normocephalic and atraumatic.  Right Ear: External ear normal.  Left Ear: External ear normal.  Nose: Nose normal.  Mouth/Throat: Oropharynx is clear and moist.  Eyes: Conjunctivae and EOM are normal. Pupils are equal, round, and reactive to light.  Neck: Neck supple. No thyromegaly present.  No nodes.   Cardiovascular: Normal rate, regular rhythm and normal heart sounds.   No murmur heard. Pulmonary/Chest: Effort normal.  Decreased breath sound on right posteriorly. Tender along the right chest wall. Wheezing on forced expir bilat, R>L  Musculoskeletal: She exhibits no edema.  Lymphadenopathy:    She has no cervical adenopathy.  Neurological: She is alert and oriented to person, place, and time. No cranial nerve deficit.  Skin: Skin is warm and dry.  Psychiatric: She has a normal mood and affect. Her behavior is normal.  Nursing note and vitals reviewed.  [facility] reading (PRIMARY) by  Dr. Josephina Gipoolittle=no consolidation   BP 148/100 mmHg  Pulse 77  Temp(Src) 98.4 F (36.9 C) (Oral)  Resp 18  Ht 5' 3.5" (1.613 m)  Wt 223 lb 9.6 oz (101.424 kg)  BMI 38.98 kg/m2  SpO2 98%     Assessment & Plan:  Lower respiratory infection  RAD (reactive airway disease), mild intermittent, uncomplicated  Cough -   Chest wall pain - pleuritic and musculoskel  BP elevated due to illness Meds ordered this encounter  Medications  . azithromycin (ZITHROMAX) 250 MG tablet    Sig: As packaged    Dispense:  6 tablet    Refill:  0  . HYDROcodone-homatropine (HYCODAN) 5-1.5 MG/5ML syrup    Sig: Take 5 mLs by mouth every 6 (six) hours as needed.    Dispense:  120 mL    Refill:  0  . predniSONE (DELTASONE) 20 MG tablet    Sig: 3/3/2/2/1/1 single daily dose for 6 days    Dispense:  12 tablet    Refill:  0   Reck BP when well  I have completed  the patient encounter in its entirety as documented by the scribe, with editing by me where necessary. Robert P. Merla Richesoolittle, M.D.

## 2015-11-07 ENCOUNTER — Encounter: Payer: BLUE CROSS/BLUE SHIELD | Admitting: Physician Assistant

## 2015-11-07 ENCOUNTER — Encounter: Payer: Self-pay | Admitting: Physician Assistant

## 2015-11-07 ENCOUNTER — Encounter: Payer: Self-pay | Admitting: Internal Medicine

## 2015-11-15 ENCOUNTER — Other Ambulatory Visit: Payer: Self-pay | Admitting: *Deleted

## 2015-11-15 MED ORDER — LISINOPRIL 20 MG PO TABS: 20.0000 mg | ORAL_TABLET | Freq: Every day | ORAL | Status: DC

## 2015-11-16 ENCOUNTER — Encounter: Payer: Self-pay | Admitting: Family Medicine

## 2015-11-20 ENCOUNTER — Other Ambulatory Visit: Payer: Self-pay

## 2015-11-20 MED ORDER — ALPRAZOLAM 0.5 MG PO TABS: 0.5000 mg | ORAL_TABLET | Freq: Three times a day (TID) | ORAL | Status: DC | PRN

## 2015-11-20 NOTE — Telephone Encounter (Signed)
Pharm reqs RF of alprazolam. Pended. 

## 2015-11-21 ENCOUNTER — Ambulatory Visit (INDEPENDENT_AMBULATORY_CARE_PROVIDER_SITE_OTHER): Payer: BLUE CROSS/BLUE SHIELD | Admitting: Family Medicine

## 2015-11-21 ENCOUNTER — Encounter: Payer: Self-pay | Admitting: Family Medicine

## 2015-11-21 VITALS — BP 122/88 | HR 74 | Temp 98.6°F | Resp 16 | Wt 226.0 lb

## 2015-11-21 DIAGNOSIS — Z1329 Encounter for screening for other suspected endocrine disorder: Secondary | ICD-10-CM | POA: Diagnosis not present

## 2015-11-21 DIAGNOSIS — Z13220 Encounter for screening for lipoid disorders: Secondary | ICD-10-CM | POA: Diagnosis not present

## 2015-11-21 DIAGNOSIS — I10 Essential (primary) hypertension: Secondary | ICD-10-CM | POA: Diagnosis not present

## 2015-11-21 DIAGNOSIS — Z131 Encounter for screening for diabetes mellitus: Secondary | ICD-10-CM | POA: Diagnosis not present

## 2015-11-21 DIAGNOSIS — Z130 Encounter for screening for diseases of the blood and blood-forming organs and certain disorders involving the immune mechanism: Secondary | ICD-10-CM

## 2015-11-21 DIAGNOSIS — E669 Obesity, unspecified: Secondary | ICD-10-CM | POA: Diagnosis not present

## 2015-11-21 DIAGNOSIS — Z1211 Encounter for screening for malignant neoplasm of colon: Secondary | ICD-10-CM | POA: Diagnosis not present

## 2015-11-21 DIAGNOSIS — Z0000 Encounter for general adult medical examination without abnormal findings: Secondary | ICD-10-CM

## 2015-11-21 LAB — CBC
HCT: 40 % (ref 36.0–46.0)
Hemoglobin: 13.5 g/dL (ref 12.0–15.0)
MCH: 30.5 pg (ref 26.0–34.0)
MCHC: 33.8 g/dL (ref 30.0–36.0)
MCV: 90.3 fL (ref 78.0–100.0)
MPV: 10.8 fL (ref 8.6–12.4)
PLATELETS: 224 10*3/uL (ref 150–400)
RBC: 4.43 MIL/uL (ref 3.87–5.11)
RDW: 13.9 % (ref 11.5–15.5)
WBC: 6.8 10*3/uL (ref 4.0–10.5)

## 2015-11-21 LAB — TSH: TSH: 1.105 u[IU]/mL (ref 0.350–4.500)

## 2015-11-21 LAB — HEMOGLOBIN A1C
HEMOGLOBIN A1C: 6.1 % — AB (ref <5.7)
Mean Plasma Glucose: 128 mg/dL — ABNORMAL HIGH (ref <117)

## 2015-11-21 MED ORDER — LISINOPRIL 20 MG PO TABS: 20.0000 mg | ORAL_TABLET | Freq: Every day | ORAL | Status: DC

## 2015-11-21 NOTE — Progress Notes (Signed)
[facility] [address] [place] [address] [phone]  Date:  11/21/2015   Name:  Ann Walter   DOB:  10-06-1966   MRN:  [id number]  PCP:  Abbe Amsterdam, MD    Chief Complaint: Annual Exam   History of Present Illness:  Ann Walter is a 50 y.o. very pleasant female patient who presents with the following:  History of anxiety, HTN, depression, obesity. 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    She sees Maryelizabeth Rowan at [facility] She has a referral for a colonoscopy in March of this year   Patient Active Problem List   Diagnosis Date Noted  . Ovarian cyst 02/28/2015  . Chest pain, atypical 03/08/2014  . Heart murmur 03/08/2014  . Heart palpitations 03/08/2014  . GAD (generalized anxiety disorder) 02/13/2014  . Depression   . HTN (hypertension)     Past Medical History  Diagnosis Date  . Obesity   . UTI (urinary tract infection), uncomplicated   . Depression   . HTN (hypertension)   . Benign hematuria 12/2009    WORKUP WITH DR. NESI WAS NEGATIVE  . Heart murmur   . Allergy     Past Surgical History  Procedure Laterality Date  . Cholecystectomy  10/2010  . Cystectomy      x 2 from chest    Social History  Substance Use Topics  . Smoking status: Never Smoker   . Smokeless tobacco: Never Used  . Alcohol Use: No    Family History  Problem Relation Age of Onset  . Diabetes Mother   . Heart disease Mother     HAD TRIPLE BYPASS  . Hypertension Mother   . Kidney disease Father   . Breast cancer Sister   . Breast cancer Maternal Grandmother   . Cancer Maternal Grandmother     LYMPH NODE CANCER  . Colon  cancer Neg Hx   . Cancer Maternal Aunt     COLORECTAL     Allergies  Allergen Reactions  . Wellbutrin [Bupropion]     Throat swelling   . Sulfonamide Derivatives     Unknown per pt    Medication list has been reviewed and updated.  Current Outpatient Prescriptions on File Prior to Visit  Medication Sig Dispense Refill  . ALPRAZolam (XANAX) 0.5 MG tablet Take 1 tablet (0.5 mg total) by mouth 3 (three) times daily as needed for anxiety. 90 tablet 1  . ibuprofen (ADVIL,MOTRIN) 600 MG tablet Take 600 mg by mouth every 6 (six) hours as needed for headache or moderate pain.    Marland Kitchen lisinopril (PRINIVIL,ZESTRIL) 20 MG tablet Take 1 tablet (20 mg total) by mouth daily. Pt needs office visit 30 tablet 0  . magnesium 30 MG tablet Take 60 mg by mouth 2 (two) times daily.     . norethindrone (AYGESTIN) 5 MG tablet Take by mouth daily.    . norethindrone (MICRONOR,CAMILA,ERRIN) 0.35 MG tablet Take 1 tablet by mouth daily.    . ranitidine (ZANTAC) 75 MG tablet Take 75 mg by mouth daily. Reported on 11/06/2015    .  cetirizine (ZYRTEC) 10 MG tablet Take 1 tablet (10 mg total) by mouth daily. (Patient not taking: Reported on 11/21/2015) 30 tablet 11  . [DISCONTINUED] norethindrone-ethinyl estradiol (JUNEL FE,GILDESS FE,LOESTRIN FE) 1-20 MG-MCG tablet Take 1 tablet by mouth daily. 1 Package 12   No current facility-administered medications on file prior to visit.    Review of Systems:  As per HPI- otherwise negative.   Physical Examination: Filed Vitals:   11/21/15 1026  BP: 146/85  Pulse: 74  Temp: 98.6 F (37 C)  Resp: 16   Filed Vitals:   11/21/15 1026  Weight: 226 lb (102.513 kg)   Body mass index is 39.4 kg/(m^2). Ideal Body Weight:    GEN: WDWN, NAD, Non-toxic, A & O x 3, obese, looks well HEENT: Atraumatic, Normocephalic. Neck supple. No masses, No LAD.  Bilateral TM wnl, oropharynx normal.  PEERL,EOMI.   Ears and Nose: No external deformity. CV: RRR, No M/G/R. No JVD. No  thrill. No extra heart sounds. PULM: CTA B, no wheezes, crackles, rhonchi. No retractions. No resp. distress. No accessory muscle use. ABD: S, NT, ND EXTR: No c/c/e NEURO Normal gait.  PSYCH: Normally interactive. Conversant. Not depressed or anxious appearing.  Calm demeanor.    Assessment and Plan: Physical exam  Special screening for malignant neoplasms, colon  Essential hypertension - Plan: lisinopril (PRINIVIL,ZESTRIL) 20 MG tablet  Screening for diabetes mellitus - Plan: Comprehensive metabolic panel, Hemoglobin A1c  Screening for hyperlipidemia - Plan: Lipid panel  Screening for deficiency anemia - Plan: CBC  Screening for hypothyroidism - Plan: TSH  Physical exam and form completion today She has a colonoscopy scheduled for March BP fine on recheck Await labs and will be in touch with her Plan recheck in 6-9 months  She is aware of overweight and continues to fight her lifelong issue with this.    Signed Abbe Amsterdam, MD

## 2015-11-21 NOTE — Patient Instructions (Addendum)
I will be in touch with your labs asap-  Your BP looks good today  It would be my pleasure to continue to see you as a patient at my new office, starting the last week of February.  If you prefer to remain at [facility] one of my partners will be happy to see you here.   [facility] at [facility] [address] [place], [place] [address] Phone: [phone]

## 2015-11-22 ENCOUNTER — Encounter: Payer: Self-pay | Admitting: Family Medicine

## 2015-11-22 DIAGNOSIS — R7303 Prediabetes: Secondary | ICD-10-CM | POA: Insufficient documentation

## 2015-11-22 LAB — COMPREHENSIVE METABOLIC PANEL
ALT: 13 U/L (ref 6–29)
AST: 14 U/L (ref 10–35)
Albumin: 4.2 g/dL (ref 3.6–5.1)
Alkaline Phosphatase: 71 U/L (ref 33–130)
BUN: 12 mg/dL (ref 7–25)
CALCIUM: 8.6 mg/dL (ref 8.6–10.4)
CHLORIDE: 109 mmol/L (ref 98–110)
CO2: 16 mmol/L — ABNORMAL LOW (ref 20–31)
Creat: 0.94 mg/dL (ref 0.50–1.05)
GLUCOSE: 100 mg/dL — AB (ref 65–99)
POTASSIUM: 4 mmol/L (ref 3.5–5.3)
Sodium: 143 mmol/L (ref 135–146)
Total Bilirubin: 0.4 mg/dL (ref 0.2–1.2)
Total Protein: 6.5 g/dL (ref 6.1–8.1)

## 2015-11-22 LAB — LIPID PANEL
CHOL/HDL RATIO: 3.1 ratio (ref <=5.0)
CHOLESTEROL: 173 mg/dL (ref 125–200)
HDL: 55 mg/dL (ref >=46)
LDL Cholesterol: 102 mg/dL (ref <130)
Triglycerides: 78 mg/dL (ref <150)
VLDL: 16 mg/dL (ref <30)

## 2015-11-23 ENCOUNTER — Telehealth: Payer: Self-pay

## 2015-11-23 ENCOUNTER — Encounter: Payer: Self-pay | Admitting: Family Medicine

## 2015-11-23 DIAGNOSIS — R7303 Prediabetes: Secondary | ICD-10-CM

## 2015-11-23 MED ORDER — METFORMIN HCL 500 MG PO TABS: 500.0000 mg | ORAL_TABLET | Freq: Every day | ORAL | Status: DC

## 2015-11-23 NOTE — Telephone Encounter (Signed)
Patient of Dr. Patsy Lager. She was seen on Wednesday and received a Mychart message about possibly trying a new medication. She wants Dr. Patsy Lager to know that she does want to try this medication. She uses [facility] on [address]. Cb# [phone].

## 2015-12-24 ENCOUNTER — Ambulatory Visit: Payer: BLUE CROSS/BLUE SHIELD | Admitting: Family Medicine

## 2015-12-26 ENCOUNTER — Ambulatory Visit (AMBULATORY_SURGERY_CENTER): Payer: Self-pay

## 2015-12-26 VITALS — Ht 63.5 in | Wt 223.0 lb

## 2015-12-26 DIAGNOSIS — Z1211 Encounter for screening for malignant neoplasm of colon: Secondary | ICD-10-CM

## 2015-12-26 NOTE — Progress Notes (Signed)
No egg or soy allergies Not on home 02 No previous anesthesia complications No diet or weight loss meds 

## 2016-01-02 ENCOUNTER — Other Ambulatory Visit: Payer: Self-pay | Admitting: Family Medicine

## 2016-01-09 ENCOUNTER — Ambulatory Visit (AMBULATORY_SURGERY_CENTER): Payer: BLUE CROSS/BLUE SHIELD | Admitting: Internal Medicine

## 2016-01-09 ENCOUNTER — Encounter: Payer: Self-pay | Admitting: Internal Medicine

## 2016-01-09 VITALS — BP 108/65 | HR 76 | Temp 98.9°F | Resp 18 | Ht 63.5 in | Wt 223.0 lb

## 2016-01-09 DIAGNOSIS — Z1211 Encounter for screening for malignant neoplasm of colon: Secondary | ICD-10-CM

## 2016-01-09 LAB — GLUCOSE, CAPILLARY
GLUCOSE-CAPILLARY: 80 mg/dL (ref 65–99)
Glucose-Capillary: 82 mg/dL (ref 65–99)
Glucose-Capillary: 72 mg/dL (ref 65–99)

## 2016-01-09 MED ORDER — SODIUM CHLORIDE 0.9 % IV SOLN: 500.0000 mL | INTRAVENOUS | Status: DC

## 2016-01-09 NOTE — Op Note (Signed)
[facility] Patient Name: Ann GarterWanda Sorci Procedure Date: 01/09/2016 2:31 PM MRN: [id number] Endoscopist: Iva Booparl E Gessner , MD Age: [age] Referring MD:  Date of Birth: [birth year] Gender: Female Procedure:                Colonoscopy Indications:              Screening for colorectal malignant neoplasm Medicines:                Monitored Anesthesia Care, Propofol total dose 200                            mg IV Procedure:                Pre-Anesthesia Assessment:                           - Prior to the procedure, a History and Physical                            was performed, and patient medications and                            allergies were reviewed. The patient's tolerance of                            previous anesthesia was also reviewed. The risks                            and benefits of the procedure and the sedation                            options and risks were discussed with the patient.                            All questions were answered, and informed consent                            was obtained. Prior Anticoagulants: The patient has                            taken no previous anticoagulant or antiplatelet                            agents. ASA Grade Assessment: II - A patient with                            mild systemic disease. After reviewing the risks                            and benefits, the patient was deemed in                            satisfactory condition to undergo the procedure.  After obtaining informed consent, the colonoscope                            was passed under direct vision. Throughout the                            procedure, the patient's blood pressure, pulse, and                            oxygen saturations were monitored continuously. The                            Model PCF-H190L [id number]) scope was introduced                            through the anus and advanced to the the cecum,                    identified by appendiceal orifice and ileocecal                            valve. The colonoscopy was performed without                            difficulty. The patient tolerated the procedure                            well. The quality of the bowel preparation was                            excellent. The bowel preparation used was Miralax.                            The ileocecal valve, appendiceal orifice, and                            rectum were photographed. Scope In: 2:42:43 PM Scope Out: 2:56:07 PM Scope Withdrawal Time: 0 hours 10 minutes 22 seconds  Total Procedure Duration: 0 hours 13 minutes 24 seconds  Findings:      The perianal and digital rectal examinations were normal.      The entire examined colon appeared normal on direct and retroflexion       views. Complications:            No immediate complications. Estimated Blood Loss:     Estimated blood loss: none. Impression:               - The entire examined colon is normal on direct and                            retroflexion views.                           - No specimens collected. Recommendation:           - Patient has a contact number available for  emergencies. The signs and symptoms of potential                            delayed complications were discussed with the                            patient. Return to normal activities tomorrow.                            Written discharge instructions were provided to the                            patient.                           - Resume previous diet.                           - Continue present medications.                           - Repeat colonoscopy in 10 years for screening                            purposes. Procedure Code(s):        --- Professional ---                           R6045, Colorectal cancer screening; colonoscopy on                            individual not meeting criteria for high risk CPT  copyright 2016 [employer] Medical Association. All rights reserved. Iva Boop, MD Iva Boop, MD 01/09/2016 3:01:05 PM Number of Addenda: 0

## 2016-01-09 NOTE — Progress Notes (Signed)
Report to PACU, RN, vss, BBS= Clear.  

## 2016-01-09 NOTE — Patient Instructions (Addendum)
No polyps!  Next routine colonoscopy in 10 years - 2027  I appreciate the opportunity to care for you. Iva Boop, MD, FACG YOU HAD AN ENDOSCOPIC PROCEDURE TODAY AT THE [facility]:   Refer to the procedure report that was given to you for any specific questions about what was found during the examination.  If the procedure report does not answer your questions, please call your gastroenterologist to clarify.  If you requested that your care partner not be given the details of your procedure findings, then the procedure report has been included in a sealed envelope for you to review at your convenience later.  YOU SHOULD EXPECT: Some feelings of bloating in the abdomen. Passage of more gas than usual.  Walking can help get rid of the air that was put into your GI tract during the procedure and reduce the bloating. If you had a lower endoscopy (such as a colonoscopy or flexible sigmoidoscopy) you may notice spotting of blood in your stool or on the toilet paper. If you underwent a bowel prep for your procedure, you may not have a normal bowel movement for a few days.  Please Note:  You might notice some irritation and congestion in your nose or some drainage.  This is from the oxygen used during your procedure.  There is no need for concern and it should clear up in a day or so.  SYMPTOMS TO REPORT IMMEDIATELY:   Following lower endoscopy (colonoscopy or flexible sigmoidoscopy):  Excessive amounts of blood in the stool  Significant tenderness or worsening of abdominal pains  Swelling of the abdomen that is new, acute  Fever of 100F or higher   For urgent or emergent issues, a gastroenterologist can be reached at any hour by calling [phone].   DIET: Your first meal following the procedure should be a small meal and then it is ok to progress to your normal diet. Heavy or fried foods are harder to digest and may make you feel nauseous or bloated.  Likewise, meals  heavy in dairy and vegetables can increase bloating.  Drink plenty of fluids but you should avoid alcoholic beverages for 24 hours.  ACTIVITY:  You should plan to take it easy for the rest of today and you should NOT DRIVE or use heavy machinery until tomorrow (because of the sedation medicines used during the test).    FOLLOW UP: Our staff will call the number listed on your records the next business day following your procedure to check on you and address any questions or concerns that you may have regarding the information given to you following your procedure. If we do not reach you, we will leave a message.  However, if you are feeling well and you are not experiencing any problems, there is no need to return our call.  We will assume that you have returned to your regular daily activities without incident.  If any biopsies were taken you will be contacted by phone or by letter within the next 1-3 weeks.  Please call us at [phone] if you have not heard about the biopsies in 3 weeks.    SIGNATURES/CONFIDENTIALITY: You and/or your care partner have signed paperwork which will be entered into your electronic medical record.  These signatures attest to the fact that that the information above on your After Visit Summary has been reviewed and is understood.  Full responsibility of the confidentiality of this discharge information lies with you and/or your care-partner.  Normal screening Repeat in 10 years

## 2016-01-10 ENCOUNTER — Telehealth: Payer: Self-pay | Admitting: Emergency Medicine

## 2016-01-10 NOTE — Telephone Encounter (Signed)
  Follow up Call-  Call back number 01/09/2016  Post procedure Call Back phone  # [phone]  Permission to leave phone message Yes     Patient questions:  Do you have a fever, pain , or abdominal swelling? No. Pain Score  0 *  Have you tolerated food without any problems? Yes.    Have you been able to return to your normal activities? Yes.    Do you have any questions about your discharge instructions: Diet   No. Medications  No. Follow up visit  No.  Do you have questions or concerns about your Care? No.  Actions: * If pain score is 4 or above: No action needed, pain <4.

## 2016-01-16 ENCOUNTER — Other Ambulatory Visit: Payer: Self-pay | Admitting: Family Medicine

## 2016-01-17 ENCOUNTER — Ambulatory Visit (INDEPENDENT_AMBULATORY_CARE_PROVIDER_SITE_OTHER): Payer: BLUE CROSS/BLUE SHIELD | Admitting: Family Medicine

## 2016-01-17 ENCOUNTER — Encounter: Payer: Self-pay | Admitting: Family Medicine

## 2016-01-17 ENCOUNTER — Ambulatory Visit (HOSPITAL_BASED_OUTPATIENT_CLINIC_OR_DEPARTMENT_OTHER)
Admission: RE | Admit: 2016-01-17 | Discharge: 2016-01-17 | Disposition: A | Discharge status: Home / Self Care | Payer: BLUE CROSS/BLUE SHIELD | Source: Ambulatory Visit | Attending: Family Medicine | Admitting: Family Medicine

## 2016-01-17 VITALS — BP 126/86 | HR 74 | Temp 98.4°F | Ht 63.5 in | Wt 215.2 lb

## 2016-01-17 DIAGNOSIS — F411 Generalized anxiety disorder: Secondary | ICD-10-CM | POA: Diagnosis not present

## 2016-01-17 DIAGNOSIS — R0789 Other chest pain: Secondary | ICD-10-CM

## 2016-01-17 DIAGNOSIS — R079 Chest pain, unspecified: Secondary | ICD-10-CM | POA: Insufficient documentation

## 2016-01-17 LAB — POCT URINE PREGNANCY: Preg Test, Ur: NEGATIVE

## 2016-01-17 MED ORDER — ALPRAZOLAM 0.5 MG PO TABS: 0.5000 mg | ORAL_TABLET | Freq: Three times a day (TID) | ORAL | Status: DC | PRN

## 2016-01-17 NOTE — Patient Instructions (Signed)
I think that you have musculoskeletal pain in your chest.  However we will check a chest x-ray to make sure that all is well.  I will be in touch with this result.   In the meantime ibuprofen and/ or tylenol are fine for your pain

## 2016-01-17 NOTE — Progress Notes (Signed)
[facility] [address], [address] [place], [place] [address] [phone]  Date:  01/17/2016   Name:  Ann Walter   DOB:  1966/09/30   MRN:  [id number]  PCP:  Abbe Amsterdam, MD    Chief Complaint: Chest Congestion   History of Present Illness:  Ann Walter is a 50 y.o. very pleasant female patient who presents with the following:  History of pre-diabetes, HTN, anxiety.  Here today with concern of illness- she has been ill for a couple of days. She was exposed to an ill family member. 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  The math is hard for her but overall this is doing well.  She is still using her xanax TID for anxiety- due for a refill She had a recent clear colonoscopy which was a relief to her  She is on continous OCP so we do not suspect pregnancy   Wt Readings from Last 3 Encounters:  01/17/16 215 lb 3.2 oz (97.614 kg)  01/09/16 223 lb (101.152 kg)  12/26/15 223 lb (101.152 kg)   Lab Results  Component Value Date   HGBA1C 6.1* 11/21/2015    Patient Active Problem List   Diagnosis Date Noted  . Pre-diabetes 11/22/2015  . Obesity 11/21/2015  . Ovarian cyst 02/28/2015  . Chest pain, atypical 03/08/2014  . Heart murmur 03/08/2014  . Heart palpitations 03/08/2014  . GAD (generalized anxiety disorder) 02/13/2014  . Depression   . HTN (hypertension)     Past Medical History  Diagnosis Date  . Obesity   . UTI (urinary  tract infection), uncomplicated   . Depression   . HTN (hypertension)   . Benign hematuria 12/2009    WORKUP WITH DR. NESI WAS NEGATIVE  . Heart murmur   . Allergy   . Diabetes mellitus without complication (HCC)     prediabetes per pt    Past Surgical History  Procedure Laterality Date  . Cholecystectomy  10/2010  . Cystectomy      x 2 from chest    Social History  Substance Use Topics  . Smoking status: Never Smoker   . Smokeless tobacco: Never Used  . Alcohol Use: 0.0 oz/week    0 Standard drinks or equivalent per week     Comment: occasionally    Family History  Problem Relation Age of Onset  . Diabetes Mother   . Heart disease Mother     HAD TRIPLE BYPASS  . Hypertension Mother   . Kidney disease Father   . Breast cancer Sister   . Breast cancer Maternal Grandmother   . Cancer Maternal Grandmother     LYMPH NODE CANCER  . Colon cancer Neg Hx   . Cancer Maternal Aunt     COLORECTAL     Allergies  Allergen Reactions  . Wellbutrin [Bupropion]  Throat swelling   . Sulfonamide Derivatives     Unknown per pt    Medication list has been reviewed and updated.  Current Outpatient Prescriptions on File Prior to Visit  Medication Sig Dispense Refill  . ALPRAZolam (XANAX) 0.5 MG tablet Take 1 tablet (0.5 mg total) by mouth 3 (three) times daily as needed for anxiety. 90 tablet 1  . ibuprofen (ADVIL,MOTRIN) 600 MG tablet Take 600 mg by mouth every 6 (six) hours as needed for headache or moderate pain.    Marland Kitchen. lisinopril (PRINIVIL,ZESTRIL) 20 MG tablet take 2 tablets by mouth once daily (Patient taking differently: take 1 tablets by mouth once daily) 60 tablet 0  . magnesium 30 MG tablet Take 60 mg by mouth as needed.     . metFORMIN (GLUCOPHAGE) 500 MG tablet Take 1 tablet (500 mg total) by mouth daily. 90 tablet 3  . norethindrone (MICRONOR,CAMILA,ERRIN) 0.35 MG tablet Take 1 tablet by mouth daily.    . ranitidine (ZANTAC) 75 MG tablet Take 75 mg by mouth daily.  Reported on 11/06/2015    . [DISCONTINUED] norethindrone-ethinyl estradiol (JUNEL FE,GILDESS FE,LOESTRIN FE) 1-20 MG-MCG tablet Take 1 tablet by mouth daily. 1 Package 12   No current facility-administered medications on file prior to visit.    Review of Systems:  As per HPI- otherwise negative.   Physical Examination: Filed Vitals:   01/17/16 1345  BP: 126/86  Pulse: 74  Temp: 98.4 F (36.9 C)   Filed Vitals:   01/17/16 1345  Height: 5' 3.5" (1.613 m)  Weight: 215 lb 3.2 oz (97.614 kg)   Body mass index is 37.52 kg/(m^2). Ideal Body Weight: Weight in (lb) to have BMI = 25: 143.1  GEN: WDWN, NAD, Non-toxic, A & O x 3, obese, but has lost HEENT: Atraumatic, Normocephalic. Neck supple. No masses, No LAD. Ears and Nose: No external deformity. CV: RRR, No M/G/R. No JVD. No thrill. No extra heart sounds. PULM: CTA B, no wheezes, crackles, rhonchi. No retractions. No resp. distress. No accessory muscle use. ABD: S, NT, ND, +BS. No rebound. No HSM. EXTR: No c/c/e NEURO Normal gait.  PSYCH: Normally interactive. Conversant. Not depressed or anxious appearing.  Calm demeanor.  Easily reproducible chest wall tenderness in the right axilla and in the right anterior chest.  No LAD noted   EKG: compared with tracing from 01/2014.  No charge, normal EKG with NSR Results for orders placed or performed in visit on 01/17/16  POCT urine pregnancy  Result Value Ref Range   Preg Test, Ur Negative Negative   Dg Chest 2 View  01/17/2016  CLINICAL DATA:  Right-sided chest pain for 3 days EXAM: CHEST  2 VIEW COMPARISON:  11/06/2015 FINDINGS: Normal heart size. Lungs clear. No pneumothorax. No pleural effusion. IMPRESSION: No active cardiopulmonary disease. Electronically Signed   By: Jolaine ClickArthur  Hoss M.D.   On: 01/17/2016 15:11    Assessment and Plan: Musculoskeletal chest pain  Right-sided chest pain - Plan: POCT urine pregnancy, EKG 12-Lead, DG Chest 2 View  GAD (generalized anxiety  disorder) - Plan: ALPRAZolam (XANAX) 0.5 MG tablet  Here today with right sided chest and axillary tenderness- this seems to be MSK in origin.  EKG is reassuring, will also obtain CXR Assuming negative plan to treat for MSK pain Pt understands and agrees with plan Refilled her prn xanax today Sent her CXR report to her on mychart- she will alert me if not better soon  Signed Abbe AmsterdamJessica Tannah Dreyfuss, MD

## 2016-02-06 ENCOUNTER — Telehealth: Payer: Self-pay | Admitting: Family Medicine

## 2016-02-18 ENCOUNTER — Other Ambulatory Visit: Payer: Self-pay

## 2016-02-18 ENCOUNTER — Other Ambulatory Visit: Payer: Self-pay | Admitting: Women's Health

## 2016-02-18 ENCOUNTER — Telehealth: Payer: Self-pay

## 2016-02-18 DIAGNOSIS — Z1231 Encounter for screening mammogram for malignant neoplasm of breast: Secondary | ICD-10-CM

## 2016-02-18 MED ORDER — LISINOPRIL 20 MG PO TABS: 40.0000 mg | ORAL_TABLET | Freq: Every day | ORAL | Status: DC

## 2016-02-18 NOTE — Telephone Encounter (Signed)
[facility] is calling to increase the lisinipril refill to a 90 day  Supply [phone]

## 2016-02-18 NOTE — Telephone Encounter (Signed)
Pharmacy called regarding lisinopril (PRINIVIL,ZESTRIL) 20 MG tablet requesting a 90 day supply due to cost being cheaper. Requesting a new Rx  [facility] - [place], [place] - [address] [phone] (Phone) [phone] (Fax)

## 2016-02-18 NOTE — Telephone Encounter (Signed)
This pt was seen by Dr Patsy Lageropland at [facility]. Called and LM for pharm w/new office ph and fax #s.

## 2016-02-25 ENCOUNTER — Ambulatory Visit (INDEPENDENT_AMBULATORY_CARE_PROVIDER_SITE_OTHER): Payer: BLUE CROSS/BLUE SHIELD | Admitting: Women's Health

## 2016-02-25 ENCOUNTER — Encounter: Payer: Self-pay | Admitting: Women's Health

## 2016-02-25 VITALS — BP 128/80 | Wt 212.0 lb

## 2016-02-25 DIAGNOSIS — Z01419 Encounter for gynecological examination (general) (routine) without abnormal findings: Secondary | ICD-10-CM | POA: Diagnosis not present

## 2016-02-25 DIAGNOSIS — F411 Generalized anxiety disorder: Secondary | ICD-10-CM | POA: Diagnosis not present

## 2016-02-25 DIAGNOSIS — Z3041 Encounter for surveillance of contraceptive pills: Secondary | ICD-10-CM | POA: Diagnosis not present

## 2016-02-25 MED ORDER — ALPRAZOLAM 0.5 MG PO TABS: 0.5000 mg | ORAL_TABLET | Freq: Three times a day (TID) | ORAL | Status: DC | PRN

## 2016-02-25 MED ORDER — NORETHINDRONE 0.35 MG PO TABS: 1.0000 | ORAL_TABLET | Freq: Every day | ORAL | Status: DC

## 2016-02-25 NOTE — Patient Instructions (Signed)
Health Maintenance, Female Adopting a healthy lifestyle and getting preventive care can go a long way to promote health and wellness. Talk with your health care provider about what schedule of regular examinations is right for you. This is a good chance for you to check in with your provider about disease prevention and staying healthy. In between checkups, there are plenty of things you can do on your own. Experts have done a lot of research about which lifestyle changes and preventive measures are most likely to keep you healthy. Ask your health care provider for more information. WEIGHT AND DIET  Eat a healthy diet  Be sure to include plenty of vegetables, fruits, low-fat dairy products, and lean protein.  Do not eat a lot of foods high in solid fats, added sugars, or salt.  Get regular exercise. This is one of the most important things you can do for your health.  Most adults should exercise for at least 150 minutes each week. The exercise should increase your heart rate and make you sweat (moderate-intensity exercise).  Most adults should also do strengthening exercises at least twice a week. This is in addition to the moderate-intensity exercise.  Maintain a healthy weight  Body mass index (BMI) is a measurement that can be used to identify possible weight problems. It estimates body fat based on height and weight. Your health care provider can help determine your BMI and help you achieve or maintain a healthy weight.  For females 20 years of age and older:   A BMI below 18.5 is considered underweight.  A BMI of 18.5 to 24.9 is normal.  A BMI of 25 to 29.9 is considered overweight.  A BMI of 30 and above is considered obese.  Watch levels of cholesterol and blood lipids  You should start having your blood tested for lipids and cholesterol at 51 years of age, then have this test every 5 years.  You may need to have your cholesterol levels checked more often if:  Your lipid  or cholesterol levels are high.  You are older than 50 years of age.  You are at high risk for heart disease.  CANCER SCREENING   Lung Cancer  Lung cancer screening is recommended for adults 55-80 years old who are at high risk for lung cancer because of a history of smoking.  A yearly low-dose CT scan of the lungs is recommended for people who:  Currently smoke.  Have quit within the past 15 years.  Have at least a 30-pack-year history of smoking. A pack year is smoking an average of one pack of cigarettes a day for 1 year.  Yearly screening should continue until it has been 15 years since you quit.  Yearly screening should stop if you develop a health problem that would prevent you from having lung cancer treatment.  Breast Cancer  Practice breast self-awareness. This means understanding how your breasts normally appear and feel.  It also means doing regular breast self-exams. Let your health care provider know about any changes, no matter how small.  If you are in your 20s or 30s, you should have a clinical breast exam (CBE) by a health care provider every 1-3 years as part of a regular health exam.  If you are 40 or older, have a CBE every year. Also consider having a breast X-ray (mammogram) every year.  If you have a family history of breast cancer, talk to your health care provider about genetic screening.  If you   are at high risk for breast cancer, talk to your health care provider about having an MRI and a mammogram every year.  Breast cancer gene (BRCA) assessment is recommended for women who have family members with BRCA-related cancers. BRCA-related cancers include:  Breast.  Ovarian.  Tubal.  Peritoneal cancers.  Results of the assessment will determine the need for genetic counseling and BRCA1 and BRCA2 testing. Cervical Cancer Your health care provider may recommend that you be screened regularly for cancer of the pelvic organs (ovaries, uterus, and  vagina). This screening involves a pelvic examination, including checking for microscopic changes to the surface of your cervix (Pap test). You may be encouraged to have this screening done every 3 years, beginning at age 21.  For women ages 30-65, health care providers may recommend pelvic exams and Pap testing every 3 years, or they may recommend the Pap and pelvic exam, combined with testing for human papilloma virus (HPV), every 5 years. Some types of HPV increase your risk of cervical cancer. Testing for HPV may also be done on women of any age with unclear Pap test results.  Other health care providers may not recommend any screening for nonpregnant women who are considered low risk for pelvic cancer and who do not have symptoms. Ask your health care provider if a screening pelvic exam is right for you.  If you have had past treatment for cervical cancer or a condition that could lead to cancer, you need Pap tests and screening for cancer for at least 20 years after your treatment. If Pap tests have been discontinued, your risk factors (such as having a new sexual partner) need to be reassessed to determine if screening should resume. Some women have medical problems that increase the chance of getting cervical cancer. In these cases, your health care provider may recommend more frequent screening and Pap tests. Colorectal Cancer  This type of cancer can be detected and often prevented.  Routine colorectal cancer screening usually begins at 50 years of age and continues through 51 years of age.  Your health care provider may recommend screening at an earlier age if you have risk factors for colon cancer.  Your health care provider may also recommend using home test kits to check for hidden blood in the stool.  A small camera at the end of a tube can be used to examine your colon directly (sigmoidoscopy or colonoscopy). This is done to check for the earliest forms of colorectal  cancer.  Routine screening usually begins at age 50.  Direct examination of the colon should be repeated every 5-10 years through 51 years of age. However, you may need to be screened more often if early forms of precancerous polyps or small growths are found. Skin Cancer  Check your skin from head to toe regularly.  Tell your health care provider about any new moles or changes in moles, especially if there is a change in a mole's shape or color.  Also tell your health care provider if you have a mole that is larger than the size of a pencil eraser.  Always use sunscreen. Apply sunscreen liberally and repeatedly throughout the day.  Protect yourself by wearing long sleeves, pants, a wide-brimmed hat, and sunglasses whenever you are outside. HEART DISEASE, DIABETES, AND HIGH BLOOD PRESSURE   High blood pressure causes heart disease and increases the risk of stroke. High blood pressure is more likely to develop in:  People who have blood pressure in the high end   of the normal range (130-139/85-89 mm Hg).  People who are overweight or obese.  People who are African American.  If you are 38-23 years of age, have your blood pressure checked every 3-5 years. If you are 61 years of age or older, have your blood pressure checked every year. You should have your blood pressure measured twice--once when you are at a hospital or clinic, and once when you are not at a hospital or clinic. Record the average of the two measurements. To check your blood pressure when you are not at a hospital or clinic, you can use:  An automated blood pressure machine at a pharmacy.  A home blood pressure monitor.  If you are between 45 years and 39 years old, ask your health care provider if you should take aspirin to prevent strokes.  Have regular diabetes screenings. This involves taking a blood sample to check your fasting blood sugar level.  If you are at a normal weight and have a low risk for diabetes,  have this test once every three years after 51 years of age.  If you are overweight and have a high risk for diabetes, consider being tested at a younger age or more often. PREVENTING INFECTION  Hepatitis B  If you have a higher risk for hepatitis B, you should be screened for this virus. You are considered at high risk for hepatitis B if:  You were born in a country where hepatitis B is common. Ask your health care provider which countries are considered high risk.  Your parents were born in a high-risk country, and you have not been immunized against hepatitis B (hepatitis B vaccine).  You have HIV or AIDS.  You use needles to inject street drugs.  You live with someone who has hepatitis B.  You have had sex with someone who has hepatitis B.  You get hemodialysis treatment.  You take certain medicines for conditions, including cancer, organ transplantation, and autoimmune conditions. Hepatitis C  Blood testing is recommended for:  Everyone born from [birth year] through 1966.  Anyone with known risk factors for hepatitis C. Sexually transmitted infections (STIs)  You should be screened for sexually transmitted infections (STIs) including gonorrhea and chlamydia if:  You are sexually active and are younger than 51 years of age.  You are older than 51 years of age and your health care provider tells you that you are at risk for this type of infection.  Your sexual activity has changed since you were last screened and you are at an increased risk for chlamydia or gonorrhea. Ask your health care provider if you are at risk.  If you do not have HIV, but are at risk, it may be recommended that you take a prescription medicine daily to prevent HIV infection. This is called pre-exposure prophylaxis (PrEP). You are considered at risk if:  You are sexually active and do not regularly use condoms or know the HIV status of your partner(s).  You take drugs by injection.  You are sexually  active with a partner who has HIV. Talk with your health care provider about whether you are at high risk of being infected with HIV. If you choose to begin PrEP, you should first be tested for HIV. You should then be tested every 3 months for as long as you are taking PrEP.  PREGNANCY   If you are premenopausal and you may become pregnant, ask your health care provider about preconception counseling.  If you may  become pregnant, take 400 to 800 micrograms (mcg) of folic acid every day.  If you want to prevent pregnancy, talk to your health care provider about birth control (contraception). OSTEOPOROSIS AND MENOPAUSE   Osteoporosis is a disease in which the bones lose minerals and strength with aging. This can result in serious bone fractures. Your risk for osteoporosis can be identified using a bone density scan.  If you are 61 years of age or older, or if you are at risk for osteoporosis and fractures, ask your health care provider if you should be screened.  Ask your health care provider whether you should take a calcium or vitamin D supplement to lower your risk for osteoporosis.  Menopause may have certain physical symptoms and risks.  Hormone replacement therapy may reduce some of these symptoms and risks. Talk to your health care provider about whether hormone replacement therapy is right for you.  HOME CARE INSTRUCTIONS   Schedule regular health, dental, and eye exams.  Stay current with your immunizations.   Do not use any tobacco products including cigarettes, chewing tobacco, or electronic cigarettes.  If you are pregnant, do not drink alcohol.  If you are breastfeeding, limit how much and how often you drink alcohol.  Limit alcohol intake to no more than 1 drink per day for nonpregnant women. One drink equals 12 ounces of beer, 5 ounces of wine, or 1 ounces of hard liquor.  Do not use street drugs.  Do not share needles.  Ask your health care provider for help if  you need support or information about quitting drugs.  Tell your health care provider if you often feel depressed.  Tell your health care provider if you have ever been abused or do not feel safe at home.   This information is not intended to replace advice given to you by your health care provider. Make sure you discuss any questions you have with your health care provider.   Document Released: 04/28/2011 Document Revised: 11/03/2014 Document Reviewed: 09/14/2013 Elsevier Interactive Patient Education [employer].

## 2016-02-25 NOTE — Progress Notes (Signed)
Ann SettersWanda D Walter [birth year] [id number]    History:    Presents for annual exam.  Rare bleeding on Micronor, reports continues to get monthly bloating, PMS type symptoms. Normal Pap and mammogram history. Hypertension managed by primary care. 12/2015 negative colonoscopy. History of benign hematuria. 2013 severe depression doing better but continues to struggle with depression. Was laid off from long-term job 6 months ago, recently completed pharmacy tech training.  Past medical history, past surgical history, family history and social history were all reviewed and documented in the EPIC chart. Stepdaughter lives in [place] graduating from [place] school. Has 2 miniature donkeys. 2012 cholecystectomy.  ROS:  A ROS was performed and pertinent positives and negatives are included.  Exam:  Filed Vitals:   02/25/16 1405  BP: 128/80    General appearance:  Normal Thyroid:  Symmetrical, normal in size, without palpable masses or nodularity. Respiratory  Auscultation:  Clear without wheezing or rhonchi Cardiovascular  Auscultation:  Regular rate, without rubs, murmurs or gallops  Edema/varicosities:  Not grossly evident Abdominal  Soft,nontender, without masses, guarding or rebound.  Liver/spleen:  No organomegaly noted  Hernia:  None appreciated  Skin  Inspection:  Grossly normal   Breasts: Examined lying and sitting.     Right: Without masses, retractions, discharge or axillary adenopathy.     Left: Without masses, retractions, discharge or axillary adenopathy. Gentitourinary   Inguinal/mons:  Normal without inguinal adenopathy  External genitalia:  Normal  BUS/Urethra/Skene's glands:  Normal  Vagina:  Normal  Cervix:  Normal  Uterus:  normal in size, shape and contour.  Midline and mobile  Adnexa/parametria:     Rt: Without masses or tenderness.   Lt: Without masses or tenderness.  Anus and perineum: Normal  Digital rectal exam: Normal sphincter tone without palpated masses or  tenderness  Assessment/Plan:  50 y.o. MWF G0 for annual exam.  Amenorrhea on Micronor Hypertension and depression-primary care manages labs and meds Obesity History of benign hematuria Situational stress-job layoff History of asymptomatic functional ovarian cysts  Plan: Micronor prescription, proper use, reviewed slight risk for blood clots and strokes, menopause reviewed, denies symptoms . SBE's, continue annual screening mammogram, exercise, calcium rich diet, vitamin D 1000 daily encouraged. Decrease carbs/calories and increase exercise encouraged. Pap normal with negative HR HPV 2016, new screening guidelines reviewed. Reviewed importance of increasing leisure activities.   Harrington ChallengerYOUNG,Ann Walter WHNP, 2:50 PM 02/25/2016

## 2016-03-04 ENCOUNTER — Ambulatory Visit
Admission: RE | Admit: 2016-03-04 | Discharge: 2016-03-04 | Disposition: A | Discharge status: Home / Self Care | Payer: BLUE CROSS/BLUE SHIELD | Source: Ambulatory Visit

## 2016-03-04 DIAGNOSIS — Z1231 Encounter for screening mammogram for malignant neoplasm of breast: Secondary | ICD-10-CM

## 2016-03-18 ENCOUNTER — Encounter: Payer: Self-pay | Admitting: Women's Health

## 2016-04-22 ENCOUNTER — Other Ambulatory Visit: Payer: Self-pay | Admitting: Family Medicine

## 2016-06-24 ENCOUNTER — Other Ambulatory Visit: Payer: Self-pay | Admitting: Family Medicine

## 2016-06-24 ENCOUNTER — Encounter: Payer: Self-pay | Admitting: Family Medicine

## 2016-06-24 MED ORDER — ALPRAZOLAM 0.5 MG PO TABS: 0.5000 mg | ORAL_TABLET | Freq: Three times a day (TID) | ORAL | 0 refills | Status: DC | PRN

## 2016-07-02 ENCOUNTER — Ambulatory Visit: Payer: Self-pay | Admitting: Family Medicine

## 2016-07-09 ENCOUNTER — Encounter: Payer: Self-pay | Admitting: Family Medicine

## 2016-07-09 ENCOUNTER — Ambulatory Visit (INDEPENDENT_AMBULATORY_CARE_PROVIDER_SITE_OTHER): Payer: BLUE CROSS/BLUE SHIELD | Admitting: Family Medicine

## 2016-07-09 VITALS — BP 143/82 | HR 80 | Temp 98.2°F | Ht 64.0 in | Wt 230.6 lb

## 2016-07-09 DIAGNOSIS — R635 Abnormal weight gain: Secondary | ICD-10-CM

## 2016-07-09 DIAGNOSIS — I10 Essential (primary) hypertension: Secondary | ICD-10-CM | POA: Diagnosis not present

## 2016-07-09 DIAGNOSIS — Z6740 Type O blood, Rh positive: Secondary | ICD-10-CM

## 2016-07-09 DIAGNOSIS — R7303 Prediabetes: Secondary | ICD-10-CM | POA: Diagnosis not present

## 2016-07-09 DIAGNOSIS — F411 Generalized anxiety disorder: Secondary | ICD-10-CM

## 2016-07-09 DIAGNOSIS — Z23 Encounter for immunization: Secondary | ICD-10-CM

## 2016-07-09 NOTE — Progress Notes (Signed)
[facility] [address] [place], [place] [address] [phone]  Date:  07/09/2016   Name:  Ann Walter   DOB:  1966-05-15   MRN:  [id number]  PCP:  Abbe Amsterdam, MD    Chief Complaint: No chief complaint on file.   History of Present Illness:  Ann Walter is a 50 y.o. very pleasant female patient who presents with the following:  She is here today for a medication recheck She would like to get a flu shot She got laid off and her unemployment ran out. However she was recently able to find some part time job, and she does have an interview tomorrow.    She has gained some weight which she attributes to "stress eating."  Also she has been less active she she was out of work [employer] from Last 3 Encounters:  07/09/16 230 lb 9.6 oz (104.6 kg)  02/25/16 212 lb (96.2 kg)  01/17/16 215 lb 3.2 oz (97.6 kg)   Lab Results  Component Value Date   HGBA1C 6.1 (H) 11/21/2015   She has pre-diabetes and takes metformin for same She is still using xanax regularly as per her usual. She is using her husband's insurnace right now but hopes to have a new job and benefits again soon  She also would like to have her blood type tested- she reports that the red cross told her she was A+ but she feels certain that she is O+.  She would like testing and understands that her insurance may not cover this test  BP Readings from Last 3 Encounters:  07/09/16 (!) 143/82  02/25/16 128/80  01/17/16 126/86   Her BP has generally been under good control with her lisinopril  Patient Active Problem List   Diagnosis Date Noted  . Pre-diabetes 11/22/2015  . Obesity 11/21/2015  . Ovarian cyst 02/28/2015  . Chest pain, atypical 03/08/2014  . Heart murmur 03/08/2014  . Heart palpitations 03/08/2014  . GAD (generalized anxiety disorder) 02/13/2014  . Depression   . HTN (hypertension)     Past Medical History:  Diagnosis Date  .  Allergy   . Benign hematuria 12/2009   WORKUP WITH DR. NESI WAS NEGATIVE  . Depression   . Diabetes mellitus without complication (HCC)    prediabetes per pt  . Heart murmur   . HTN (hypertension)   . Obesity   . UTI (urinary tract infection), uncomplicated     Past Surgical History:  Procedure Laterality Date  . CHOLECYSTECTOMY  10/2010  . CYSTECTOMY     x 2 from chest    Social History  Substance Use Topics  . Smoking status: Never Smoker  . Smokeless tobacco: Never Used  . Alcohol use 0.0 oz/week     Comment: occasionally    Family History  Problem Relation Age of Onset  . Diabetes Mother   . Heart disease Mother     HAD TRIPLE BYPASS  . Hypertension Mother   . Kidney disease Father   . Breast cancer Sister   . Breast cancer Maternal Grandmother   . Cancer Maternal Grandmother     LYMPH NODE CANCER  . Colon cancer Neg Hx   . Cancer Maternal Aunt     COLORECTAL     Allergies  Allergen Reactions  . Wellbutrin [Bupropion]     Throat swelling   . Sulfonamide Derivatives     Unknown per pt  Medication list has been reviewed and updated.  Current Outpatient Prescriptions on File Prior to Visit  Medication Sig Dispense Refill  . ALPRAZolam (XANAX) 0.5 MG tablet Take 1 tablet (0.5 mg total) by mouth 3 (three) times daily as needed for anxiety. 90 tablet 0  . ibuprofen (ADVIL,MOTRIN) 600 MG tablet Take 600 mg by mouth every 6 (six) hours as needed for headache or moderate pain.    Marland Kitchen. lisinopril (PRINIVIL,ZESTRIL) 20 MG tablet Take 2 tablets (40 mg total) by mouth daily. 180 tablet 0  . magnesium 30 MG tablet Take 60 mg by mouth as needed.     . metFORMIN (GLUCOPHAGE) 500 MG tablet Take 1 tablet (500 mg total) by mouth daily. 90 tablet 3  . norethindrone (MICRONOR,CAMILA,ERRIN) 0.35 MG tablet Take 1 tablet (0.35 mg total) by mouth daily. 3 Package 4  . ranitidine (ZANTAC) 75 MG tablet Take 75 mg by mouth daily. Reported on 11/06/2015    . [DISCONTINUED]  norethindrone-ethinyl estradiol (JUNEL FE,GILDESS FE,LOESTRIN FE) 1-20 MG-MCG tablet Take 1 tablet by mouth daily. 1 Package 12   No current facility-administered medications on file prior to visit.     Review of Systems:  As per HPI- otherwise negative. No fever, chills, nausea, vomiting. CP or SOB   Physical Examination:  Vitals:   07/09/16 1353 07/09/16 1358  BP: (!) 155/85 (!) 143/82  Pulse: 80 80  Temp: 98.2 F (36.8 C) 98.2 F (36.8 C)   Body mass index is 39.58 kg/m.  Ideal Body Weight:    GEN: WDWN, NAD, Non-toxic, A & O x 3, has gained weight but OW looks well HEENT: Atraumatic, Normocephalic. Neck supple. No masses, No LAD. Ears and Nose: No external deformity. CV: RRR, No M/G/R. No JVD. No thrill. No extra heart sounds. PULM: CTA B, no wheezes, crackles, rhonchi. No retractions. No resp. distress. No accessory muscle use. ABD: S, NT, ND, +BS. No rebound. No HSM. EXTR: No c/c/e NEURO Normal gait.  PSYCH: Normally interactive. Conversant. Not depressed or anxious appearing.  Calm demeanor.    Assessment and Plan: GAD (generalized anxiety disorder)  Essential hypertension  Pre-diabetes - Plan: Hemoglobin A1c  Blood type O+ - Plan: ABO AND RH   Encounter for immunization - Plan: Flu Vaccine QUAD 36+ mos IM  Weight gain   She does not need xanax today- asked her to do UDS however as this is due Check A1c Continue lisinopril and will follow-up her BP in a few months She will work on losing the weight she has gained  Signed Abbe AmsterdamJessica Ferdinand Revoir, MD

## 2016-07-09 NOTE — Patient Instructions (Signed)
It was great to see you today- I hope that all goes well with your interview tomorrow!  Please come and see me for a physical in January  Please do the urine drug screen at the lab today- this is standard [facility] practice for all pts on controlled substances (like xanax)

## 2016-07-10 LAB — ABO AND RH: Rh Type: POSITIVE

## 2016-07-10 LAB — HEMOGLOBIN A1C: HEMOGLOBIN A1C: 5.9 % (ref 4.6–6.5)

## 2016-07-17 ENCOUNTER — Encounter: Payer: Self-pay | Admitting: Family Medicine

## 2016-07-28 ENCOUNTER — Other Ambulatory Visit: Payer: Self-pay | Admitting: Family Medicine

## 2016-07-28 NOTE — Telephone Encounter (Signed)
Received refill request for ALPRAZOLAM 0.5 MG TABLET. Last office visit 07/09/16 and last refill 06/24/16. Will it be ok to refill? Please advise.

## 2016-09-26 ENCOUNTER — Other Ambulatory Visit: Payer: Self-pay | Admitting: Family Medicine

## 2016-09-30 ENCOUNTER — Ambulatory Visit (HOSPITAL_BASED_OUTPATIENT_CLINIC_OR_DEPARTMENT_OTHER)
Admission: RE | Admit: 2016-09-30 | Discharge: 2016-09-30 | Disposition: A | Discharge status: Home / Self Care | Payer: BLUE CROSS/BLUE SHIELD | Source: Ambulatory Visit | Attending: Family | Admitting: Family

## 2016-09-30 ENCOUNTER — Encounter: Payer: Self-pay | Admitting: Family

## 2016-09-30 ENCOUNTER — Ambulatory Visit (INDEPENDENT_AMBULATORY_CARE_PROVIDER_SITE_OTHER): Payer: BLUE CROSS/BLUE SHIELD | Admitting: Family

## 2016-09-30 VITALS — BP 120/88 | HR 71 | Temp 98.2°F | Resp 18 | Ht 64.0 in | Wt 230.6 lb

## 2016-09-30 DIAGNOSIS — F329 Major depressive disorder, single episode, unspecified: Secondary | ICD-10-CM

## 2016-09-30 DIAGNOSIS — M25571 Pain in right ankle and joints of right foot: Secondary | ICD-10-CM | POA: Diagnosis not present

## 2016-09-30 DIAGNOSIS — F32A Depression, unspecified: Secondary | ICD-10-CM

## 2016-09-30 NOTE — Progress Notes (Signed)
Pre visit review using our clinic review tool, if applicable. No additional management support is needed unless otherwise documented below in the visit note. 

## 2016-09-30 NOTE — Patient Instructions (Signed)
Please complete your ankle xray on the first floor. We will let you know how it looks.  Call if worsening depression symptoms.

## 2016-09-30 NOTE — Progress Notes (Signed)
Subjective:    Patient ID: Ann Walter, female    DOB: 1966-10-09, 50 y.o.   MRN: [id number]  HPI  Ann Walter is a 50 yr old female who presents today with several problems.  1) Ankle pain/swelling- started 1 month ago.  Ann Walter reports that Ann Walter has a remote injury 3-4 years ago.  Pain is located in the right lateral ankle. Pain is worse with abduction of the right ankle.   2) Depression- Reports that Ann Walter got laid off on 11/8.  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  Works a lot of nights. Ann Walter is unhappy with her job.  Reports that this is a difficult time of year for her due to loss of her mother around Christmas. Reports previous hx of depression and that Ann Walter has tried multiple medications for depression in the past and "I didn't like any of them." Reports that Ann Walter is not interested in medication for her depression. Ann Walter reports Ann Walter used to see a therapist that Ann Walter like but therapist retired and Ann Walter does not wish to see another therapist.       Review of Systems    see HPI  Past Medical History:  Diagnosis Date  . Allergy   . Benign hematuria 12/2009   WORKUP WITH DR. NESI WAS NEGATIVE  . Depression   . Diabetes mellitus without complication (HCC)    prediabetes per pt  . Heart murmur   . HTN (hypertension)   . Obesity   . UTI (urinary tract infection), uncomplicated      Social History   Social History  . Marital status: Married    Spouse name: N/A  . Number of children: 0  . Years of education: N/A   Occupational History  . [employer]  . [employer]   Social History Main Topics  . Smoking status: Never Smoker  . Smokeless tobacco: Never Used  . Alcohol use 0.0 oz/week     Comment: occasionally  . Drug use: No  . Sexual activity: Yes    Partners: Male    Birth control/ protection: Pill     Comment: INTERCOURSE AGE 79, SEXUAL PARTNERS MORE THAN 5   Other Topics Concern  . Not on file   Social History  Narrative   Married with no children    Past Surgical History:  Procedure Laterality Date  . CHOLECYSTECTOMY  10/2010  . CYSTECTOMY     x 2 from chest    Family History  Problem Relation Age of Onset  . Diabetes Mother   . Heart disease Mother     HAD TRIPLE BYPASS  . Hypertension Mother   . Kidney disease Father   . Breast cancer Sister   . Breast cancer Maternal Grandmother   . Cancer Maternal Grandmother     LYMPH NODE CANCER  . Colon cancer Neg Hx   . Cancer Maternal Aunt     COLORECTAL     Allergies  Allergen Reactions  . Wellbutrin [Bupropion]     Throat swelling   . Sulfonamide Derivatives     Unknown per pt    Current Outpatient Prescriptions on File Prior to Visit  Medication Sig Dispense Refill  . ALPRAZolam (XANAX) 0.5 MG tablet take 1 tablet by mouth three times a day if needed for anxiety 90 tablet 1  . lisinopril (PRINIVIL,ZESTRIL) 20 MG tablet Take 2 tablets (40 mg total) by mouth daily. 180 tablet 0  .  magnesium 30 MG tablet Take 60 mg by mouth as needed.     . metFORMIN (GLUCOPHAGE) 500 MG tablet Take 1 tablet (500 mg total) by mouth daily. 90 tablet 3  . norethindrone (MICRONOR,CAMILA,ERRIN) 0.35 MG tablet Take 1 tablet (0.35 mg total) by mouth daily. 3 Package 4  . ibuprofen (ADVIL,MOTRIN) 600 MG tablet Take 600 mg by mouth every 6 (six) hours as needed for headache or moderate pain.    . [DISCONTINUED] norethindrone-ethinyl estradiol (JUNEL FE,GILDESS FE,LOESTRIN FE) 1-20 MG-MCG tablet Take 1 tablet by mouth daily. 1 Package 12   No current facility-administered medications on file prior to visit.     BP 120/88 (BP Location: Right Arm, Cuff Size: Large)   Pulse 71   Temp 98.2 F (36.8 C) (Oral)   Resp 18   Ht 5\' 4"  (1.626 m)   Wt 230 lb 9.6 oz (104.6 kg)   SpO2 98% Comment: room air  BMI 39.58 kg/m    Objective:   Physical Exam  Constitutional: Ann Walter is oriented to person, place, and time. Ann Walter appears well-developed and well-nourished.    HENT:  Head: Normocephalic and atraumatic.  Cardiovascular: Normal rate, regular rhythm and normal heart sounds.   No murmur heard. Pulmonary/Chest: Effort normal and breath sounds normal. No respiratory distress. Ann Walter has no wheezes.  Musculoskeletal:  + right lateral ankle swelling. Tender to the touch right lateral/posterior ankle.  + pain with flexion/extension.  Neurological: Ann Walter is alert and oriented to person, place, and time.  Psychiatric: Her speech is normal and behavior is normal. Judgment and thought content normal.  Slightly flat affect          Assessment & Plan:  Depression- uncontrolled. No thoughts of self harm.   PHQ-9 score is 8.  Patient declines medication, declines referral to a therapist. Advised pt to let us know if her depression symptoms worsen or if they do not improve.  Pt verbalizes understanding.   R ankle swelling- New. will send for x ray to rule out fracture

## 2016-10-02 ENCOUNTER — Ambulatory Visit: Payer: Self-pay | Admitting: Family Medicine

## 2016-11-29 ENCOUNTER — Other Ambulatory Visit: Payer: Self-pay

## 2016-11-29 ENCOUNTER — Encounter: Payer: Self-pay | Admitting: Family Medicine

## 2016-11-29 ENCOUNTER — Encounter (HOSPITAL_COMMUNITY): Payer: Self-pay

## 2016-11-29 ENCOUNTER — Emergency Department (HOSPITAL_COMMUNITY): Payer: BLUE CROSS/BLUE SHIELD

## 2016-11-29 ENCOUNTER — Other Ambulatory Visit: Payer: Self-pay | Admitting: Family Medicine

## 2016-11-29 ENCOUNTER — Emergency Department (HOSPITAL_COMMUNITY)
Admission: EM | Admit: 2016-11-29 | Discharge: 2016-11-30 | Disposition: A | Discharge status: Home / Self Care | Payer: BLUE CROSS/BLUE SHIELD | Attending: Emergency Medicine | Admitting: Emergency Medicine

## 2016-11-29 DIAGNOSIS — I10 Essential (primary) hypertension: Secondary | ICD-10-CM | POA: Insufficient documentation

## 2016-11-29 DIAGNOSIS — E119 Type 2 diabetes mellitus without complications: Secondary | ICD-10-CM | POA: Insufficient documentation

## 2016-11-29 DIAGNOSIS — R69 Illness, unspecified: Secondary | ICD-10-CM

## 2016-11-29 DIAGNOSIS — J111 Influenza due to unidentified influenza virus with other respiratory manifestations: Secondary | ICD-10-CM | POA: Insufficient documentation

## 2016-11-29 DIAGNOSIS — R05 Cough: Secondary | ICD-10-CM | POA: Diagnosis present

## 2016-11-29 DIAGNOSIS — Z7984 Long term (current) use of oral hypoglycemic drugs: Secondary | ICD-10-CM | POA: Diagnosis not present

## 2016-11-29 LAB — COMPREHENSIVE METABOLIC PANEL
ALBUMIN: 4 g/dL (ref 3.5–5.0)
ALK PHOS: 69 U/L (ref 38–126)
ALT: 18 U/L (ref 14–54)
AST: 21 U/L (ref 15–41)
Anion gap: 10 (ref 5–15)
BILIRUBIN TOTAL: 0.6 mg/dL (ref 0.3–1.2)
BUN: 12 mg/dL (ref 6–20)
CALCIUM: 9.3 mg/dL (ref 8.9–10.3)
CO2: 24 mmol/L (ref 22–32)
CREATININE: 1 mg/dL (ref 0.44–1.00)
Chloride: 105 mmol/L (ref 101–111)
GFR calc non Af Amer: >60 mL/min (ref >60)
GLUCOSE: 101 mg/dL — AB (ref 65–99)
Potassium: 3.8 mmol/L (ref 3.5–5.1)
SODIUM: 139 mmol/L (ref 135–145)
TOTAL PROTEIN: 6.6 g/dL (ref 6.5–8.1)

## 2016-11-29 LAB — CBC WITH DIFFERENTIAL/PLATELET
BASOS PCT: 0 %
Basophils Absolute: 0 10*3/uL (ref 0.0–0.1)
EOS ABS: 0.4 10*3/uL (ref 0.0–0.7)
Eosinophils Relative: 5 %
HCT: 37.4 % (ref 36.0–46.0)
Hemoglobin: 12.4 g/dL (ref 12.0–15.0)
Lymphocytes Relative: 32 %
Lymphs Abs: 2.3 10*3/uL (ref 0.7–4.0)
MCH: 30.2 pg (ref 26.0–34.0)
MCHC: 33.2 g/dL (ref 30.0–36.0)
MCV: 91 fL (ref 78.0–100.0)
MONOS PCT: 6 %
Monocytes Absolute: 0.5 10*3/uL (ref 0.1–1.0)
Neutro Abs: 4 10*3/uL (ref 1.7–7.7)
Neutrophils Relative %: 57 %
PLATELETS: 245 10*3/uL (ref 150–400)
RBC: 4.11 MIL/uL (ref 3.87–5.11)
RDW: 14.3 % (ref 11.5–15.5)
WBC: 7.2 10*3/uL (ref 4.0–10.5)

## 2016-11-29 NOTE — ED Triage Notes (Signed)
Pt endorses bodyaches, cough, chills x 3 weeks. VSS.

## 2016-11-30 MED ORDER — NAPROXEN 500 MG PO TABS: 500.0000 mg | ORAL_TABLET | Freq: Two times a day (BID) | ORAL | 0 refills | Status: DC

## 2016-11-30 MED ORDER — CETIRIZINE HCL 10 MG PO TABS: 10.0000 mg | ORAL_TABLET | Freq: Every day | ORAL | 1 refills | Status: DC

## 2016-11-30 MED ORDER — ALBUTEROL SULFATE HFA 108 (90 BASE) MCG/ACT IN AERS: 2.0000 | INHALATION_SPRAY | Freq: Once | RESPIRATORY_TRACT | Status: DC

## 2016-11-30 MED ORDER — BENZONATATE 100 MG PO CAPS: 100.0000 mg | ORAL_CAPSULE | Freq: Three times a day (TID) | ORAL | 0 refills | Status: DC | PRN

## 2016-11-30 MED ORDER — OSELTAMIVIR PHOSPHATE 75 MG PO CAPS: 75.0000 mg | ORAL_CAPSULE | Freq: Two times a day (BID) | ORAL | 0 refills | Status: DC

## 2016-11-30 MED ORDER — FLUTICASONE PROPIONATE 50 MCG/ACT NA SUSP: 2.0000 | Freq: Every day | NASAL | 0 refills | Status: DC

## 2016-11-30 NOTE — ED Provider Notes (Signed)
[facility]-EMERGENCY DEPT Provider Note   CSN: [id number] Arrival date & time: 11/29/16  2051     History   Chief Complaint Chief Complaint  Patient presents with  . Influenza    HPI Ann Walter is a 51 y.o. female.  Ann Walter is a 51 y.o. Female who presents to the ED complaining of body aches, chills, cough, nasal congestion, post nasal drip and ear pressure since today. Patient reports she had a cold at the beginning of January that resolved, but nasal congestion persisted. Her symptoms mostly started today. She denies any fevers. She reports body aches that is worse in her back and chest. She denies wheezing or trouble breathing. She reports taking dayquil earlier today with little relief. She did get her flu shot this year. Husband is sick with similar illness. Patient denies fevers, abdominal pain, nausea, vomiting, diarrhea, urinary symptoms, rashes, neck pain, neck stiffness, sore throat, trouble swallowing, ear discharge, trouble breathing, wheezing.    The history is provided by the patient and the spouse. No language interpreter was used.  Influenza  Presenting symptoms: cough, fatigue and myalgias   Presenting symptoms: no diarrhea, no fever, no headaches, no nausea, no shortness of breath, no sore throat and no vomiting   Associated symptoms: chills, ear pain and nasal congestion   Associated symptoms: no neck stiffness     Past Medical History:  Diagnosis Date  . Allergy   . Benign hematuria 12/2009   WORKUP WITH DR. NESI WAS NEGATIVE  . Depression   . Diabetes mellitus without complication (HCC)    prediabetes per pt  . Heart murmur   . HTN (hypertension)   . Obesity   . UTI (urinary tract infection), uncomplicated     Patient Active Problem List   Diagnosis Date Noted  . Pre-diabetes 11/22/2015  . Obesity 11/21/2015  . Ovarian cyst 02/28/2015  . Chest pain, atypical 03/08/2014  . Heart murmur 03/08/2014  . Heart palpitations 03/08/2014  . GAD  (generalized anxiety disorder) 02/13/2014  . Depression   . HTN (hypertension)     Past Surgical History:  Procedure Laterality Date  . CHOLECYSTECTOMY  10/2010  . CYSTECTOMY     x 2 from chest    OB History    Gravida Para Term Preterm AB Living   1       1 0   SAB TAB Ectopic Multiple Live Births     1             Home Medications    Prior to Admission medications   Medication Sig Start Date End Date Taking? Authorizing Provider  ALPRAZolam Prudy Feeler) 0.5 MG tablet take 1 tablet by mouth three times a day if needed for anxiety 09/29/16   Gwenlyn Found Copland, MD  benzonatate (TESSALON) 100 MG capsule Take 1 capsule (100 mg total) by mouth 3 (three) times daily as needed for cough. 11/30/16   Everlene Farrier, PA-C  cetirizine (ZYRTEC ALLERGY) 10 MG tablet Take 1 tablet (10 mg total) by mouth daily. 11/30/16   Everlene Farrier, PA-C  fluticasone (FLONASE) 50 MCG/ACT nasal spray Place 2 sprays into both nostrils daily. 11/30/16   Everlene Farrier, PA-C  ibuprofen (ADVIL,MOTRIN) 600 MG tablet Take 600 mg by mouth every 6 (six) hours as needed for headache or moderate pain.    Historical Provider, MD  lisinopril (PRINIVIL,ZESTRIL) 20 MG tablet Take 2 tablets (40 mg total) by mouth daily. 02/18/16   Pearline Cables, MD  magnesium  30 MG tablet Take 60 mg by mouth as needed.     Historical Provider, MD  metFORMIN (GLUCOPHAGE) 500 MG tablet Take 1 tablet (500 mg total) by mouth daily. 11/23/15   Pearline Cables, MD  naproxen (NAPROSYN) 500 MG tablet Take 1 tablet (500 mg total) by mouth 2 (two) times daily with a meal. 11/30/16   Everlene Farrier, PA-C  norethindrone (MICRONOR,CAMILA,ERRIN) 0.35 MG tablet Take 1 tablet (0.35 mg total) by mouth daily. 02/25/16   Harrington Challenger, NP  oseltamivir (TAMIFLU) 75 MG capsule Take 1 capsule (75 mg total) by mouth every 12 (twelve) hours. 11/30/16   Everlene Farrier, PA-C  RaNITidine HCl (ACID REDUCER PO) Take 1 tablet by mouth daily.    Historical Provider, MD    Family  History Family History  Problem Relation Age of Onset  . Diabetes Mother   . Heart disease Mother     HAD TRIPLE BYPASS  . Hypertension Mother   . Kidney disease Father   . Breast cancer Sister   . Breast cancer Maternal Grandmother   . Cancer Maternal Grandmother     LYMPH NODE CANCER  . Cancer Maternal Aunt     COLORECTAL   . Colon cancer Neg Hx     Social History Social History  Substance Use Topics  . Smoking status: Never Smoker  . Smokeless tobacco: Never Used  . Alcohol use 0.0 oz/week     Comment: occasionally     Allergies   Wellbutrin [bupropion] and Sulfonamide derivatives   Review of Systems Review of Systems  Constitutional: Positive for chills and fatigue. Negative for fever.  HENT: Positive for congestion, ear pain, postnasal drip and sneezing. Negative for nosebleeds, sore throat and trouble swallowing.   Eyes: Negative for visual disturbance.  Respiratory: Positive for cough. Negative for chest tightness, shortness of breath and wheezing.   Cardiovascular: Negative for palpitations.  Gastrointestinal: Negative for abdominal pain, diarrhea, nausea and vomiting.  Genitourinary: Negative for difficulty urinating, dysuria, frequency and urgency.  Musculoskeletal: Positive for myalgias. Negative for joint swelling, neck pain and neck stiffness.  Skin: Negative for rash.  Neurological: Negative for dizziness, syncope, weakness, light-headedness, numbness and headaches.     Physical Exam Updated Vital Signs BP 157/79 (BP Location: Left Arm)   Pulse 62   Temp 98.1 F (36.7 C) (Oral)   Resp 18   Ht 5' 3.5" (1.613 m)   Wt 95.3 kg   SpO2 100%   BMI 36.62 kg/m   Physical Exam  Constitutional: She is oriented to person, place, and time. She appears well-developed and well-nourished. No distress.  Nontoxic appearing.  HENT:  Head: Normocephalic and atraumatic.  Right Ear: External ear normal.  Left Ear: External ear normal.  Mouth/Throat:  Oropharynx is clear and moist. No oropharyngeal exudate.  Mild middle ear effusion noted bilaterally without any erythema or loss of landmarks. Boggy nasal turbinates bilaterally with rhinorrhea present. No tonsillar hypertrophy or exudates. Evidence of postnasal drip.  Eyes: Conjunctivae are normal. Pupils are equal, round, and reactive to light. Right eye exhibits no discharge. Left eye exhibits no discharge.  Neck: Normal range of motion. Neck supple.  Cardiovascular: Normal rate, regular rhythm, normal heart sounds and intact distal pulses.  Exam reveals no gallop and no friction rub.   No murmur heard. Pulmonary/Chest: Effort normal and breath sounds normal. No stridor. No respiratory distress. She has no wheezes. She has no rales. She exhibits tenderness.  Lungs clear to auscultation bilaterally. No  increased work of breathing. No rales or rhonchi. No wheezing. Patient's chest and back wall is tender to palpation reproduces her body aches.  Abdominal: Soft. There is no tenderness.  Musculoskeletal: She exhibits no edema or tenderness.  No lower extremity edema or tenderness.  Lymphadenopathy:    She has no cervical adenopathy.  Neurological: She is alert and oriented to person, place, and time. Coordination normal.  Skin: Skin is warm and dry. Capillary refill takes less than 2 seconds. No rash noted. She is not diaphoretic. No erythema. No pallor.  Psychiatric: She has a normal mood and affect. Her behavior is normal.  Nursing note and vitals reviewed.    ED Treatments / Results  Labs (all labs ordered are listed, but only abnormal results are displayed) Labs Reviewed  COMPREHENSIVE METABOLIC PANEL - Abnormal; Notable for the following:       Result Value   Glucose, Bld 101 (*)    All other components within normal limits  CBC WITH DIFFERENTIAL/PLATELET    EKG  EKG Interpretation None       Radiology Dg Chest 2 View  Result Date: 11/29/2016 CLINICAL DATA:  Cough.   Body aches and chills for 3 weeks. EXAM: CHEST  2 VIEW COMPARISON:  01/17/2016 FINDINGS: The cardiomediastinal contours are normal. Mild bronchial thickening. Streaky left lung base atelectasis or scar. Pulmonary vasculature is normal. No consolidation, pleural effusion, or pneumothorax. No acute osseous abnormalities are seen. IMPRESSION: Bronchial thickening and streaky left lung base atelectasis or scar. Findings suggest bronchitis or asthma. Electronically Signed   By: Rubye OaksMelanie  Ehinger M.D.   On: 11/29/2016 22:21    Procedures Procedures (including critical care time)  Medications Ordered in ED Medications - No data to display   Initial Impression / Assessment and Plan / ED Course  I have reviewed the triage vital signs and the nursing notes.  Pertinent labs & imaging results that were available during my care of the patient were reviewed by me and considered in my medical decision making (see chart for details).    This is a 51 y.o. Female who presents to the ED complaining of body aches, chills, cough, nasal congestion, post nasal drip and ear pressure since today. Patient reports she had a cold at the beginning of January that resolved, but nasal congestion persisted. Her symptoms mostly started today. She denies any fevers. She reports body aches that is worse in her back and chest. She denies wheezing or trouble breathing. She reports taking dayquil earlier today with little relief. She did get her flu shot this year. Husband is sick with similar illness. On exam the patient is afebrile nontoxic appearing. Lungs clear to auscultation bilaterally. Rhinorrhea present. Boggy nasal terminates bilaterally. Mild middle ear effusion bilaterally. Abdomen is soft and nontender. CMP and CBC are unremarkable. No leukocytosis. Chest x-ray shows bronchial thickening, left atelectasis or scar, findings suggest bronchitis or asthma. Patient denies any wheezing or trouble breathing. No wheezing  auscultated on exam. A suspect the patient has URI versus beginnings of influenza. I discussed Tamiflu and patient agrees to try this. We'll discharge with symptomatic treatments with Flonase, Zyrtec, naproxen and Tessalon Perles. I discussed the expected course and treatment of flu. I discussed strict and specific return precautions. I advised the patient to follow-up with their primary care provider this week. I advised the patient to return to the emergency department with new or worsening symptoms or new concerns. The patient verbalized understanding and agreement with plan.  Final Clinical Impressions(s) / ED Diagnoses   Final diagnoses:  Influenza-like illness    New Prescriptions New Prescriptions   BENZONATATE (TESSALON) 100 MG CAPSULE    Take 1 capsule (100 mg total) by mouth 3 (three) times daily as needed for cough.   CETIRIZINE (ZYRTEC ALLERGY) 10 MG TABLET    Take 1 tablet (10 mg total) by mouth daily.   FLUTICASONE (FLONASE) 50 MCG/ACT NASAL SPRAY    Place 2 sprays into both nostrils daily.   NAPROXEN (NAPROSYN) 500 MG TABLET    Take 1 tablet (500 mg total) by mouth 2 (two) times daily with a meal.   OSELTAMIVIR (TAMIFLU) 75 MG CAPSULE    Take 1 capsule (75 mg total) by mouth every 12 (twelve) hours.     Everlene Farrier, PA-C 11/30/16 0026    Nira Conn, MD 11/30/16 [phone]

## 2016-12-01 ENCOUNTER — Encounter: Payer: Self-pay | Admitting: Family Medicine

## 2016-12-01 MED ORDER — LISINOPRIL 20 MG PO TABS: 40.0000 mg | ORAL_TABLET | Freq: Every day | ORAL | 0 refills | Status: DC

## 2016-12-01 MED ORDER — ALPRAZOLAM 0.5 MG PO TABS: ORAL_TABLET | ORAL | 1 refills | Status: DC

## 2016-12-01 NOTE — Telephone Encounter (Signed)
Pt is requesting refill on Alprazolam. [facility] has [facility] to [facility] [address] due to insurance changes.

## 2016-12-01 NOTE — Addendum Note (Signed)
Addended by: Conrad BurlingtonANTER, Merwin Breden D on: 12/01/2016 10:54 AM   Modules accepted: Orders

## 2016-12-01 NOTE — Telephone Encounter (Signed)
Reviewed NCCSR- no entries for this pt which is likely an error but no red flags. It is time for her xanax refill.  Will refill for her today

## 2016-12-03 ENCOUNTER — Encounter: Payer: Self-pay | Admitting: Family Medicine

## 2016-12-03 ENCOUNTER — Ambulatory Visit (INDEPENDENT_AMBULATORY_CARE_PROVIDER_SITE_OTHER): Payer: BLUE CROSS/BLUE SHIELD | Admitting: Family Medicine

## 2016-12-03 VITALS — BP 138/88 | HR 75 | Temp 98.2°F | Ht 64.0 in | Wt 237.0 lb

## 2016-12-03 DIAGNOSIS — F329 Major depressive disorder, single episode, unspecified: Secondary | ICD-10-CM

## 2016-12-03 DIAGNOSIS — J3489 Other specified disorders of nose and nasal sinuses: Secondary | ICD-10-CM

## 2016-12-03 DIAGNOSIS — F32A Depression, unspecified: Secondary | ICD-10-CM

## 2016-12-03 DIAGNOSIS — R7303 Prediabetes: Secondary | ICD-10-CM

## 2016-12-03 DIAGNOSIS — I10 Essential (primary) hypertension: Secondary | ICD-10-CM | POA: Diagnosis not present

## 2016-12-03 DIAGNOSIS — F411 Generalized anxiety disorder: Secondary | ICD-10-CM | POA: Diagnosis not present

## 2016-12-03 MED ORDER — PREDNISONE 20 MG PO TABS: ORAL_TABLET | ORAL | 0 refills | Status: DC

## 2016-12-03 NOTE — Patient Instructions (Signed)
Take the prednisone - one a day- for 3-5 days as needed for your sinus pain Let me know if you are not continuing to get better!  I am sorry that you have been ill

## 2016-12-03 NOTE — Progress Notes (Signed)
Pre visit review using our clinic review tool, if applicable. No additional management support is needed unless otherwise documented below in the visit note. 

## 2016-12-03 NOTE — Progress Notes (Signed)
[facility] [address], [address] [place], [place] [address] [phone]  Date:  12/03/2016   Name:  Ann Walter   DOB:  [birth year]   MRN:  [id number]  PCP:  Abbe Amsterdam, MD    Chief Complaint: Hospitalization Follow-up (Seen in ER 11/29/16. Pt states that she's not hurting as bad, still stuffy and having headaches in the back of head. )   History of Present Illness:  Ann Walter is a 51 y.o. very pleasant female patient who presents with the following:  Here today for a hospital follow-up visit- today is Wednesday.  She was in the ER on Saturday and was dx with likely flu.   She was given tamiflu and is overall improved but she still has some congestion and achiness in her sinuses, feels tired and not yet back to her normal state of health.  Right now the most bothersome sx is the congestion and pressure in her sinuses  She has not noted any fever with this illness She has noted a "slight cold" since early January She has noted a HA in the back of her head No ST She is not coughing- she has not needed to use the cough med she was given but she is taking her tamiflu. flonase just helps for a few minutes She also has zyrtec   She does have pre-diabetes which has been well controlled with her metformin as below Lab Results  Component Value Date   HGBA1C 5.9 07/09/2016   Her depression is about hte same.  She is able to talk to her mom and aunt about it.   Finances are a concern- she is working part time but would like to work more.  She feels that if she found full time work her depression would be better She is still on xanax.  She is not on an SSRi or other med for depression- however she declines this for now She denies any SI   Patient Active Problem List   Diagnosis Date Noted  . Pre-diabetes 11/22/2015  . Obesity 11/21/2015  . Ovarian cyst 02/28/2015  . Chest pain, atypical 03/08/2014  . Heart murmur  03/08/2014  . Heart palpitations 03/08/2014  . GAD (generalized anxiety disorder) 02/13/2014  . Depression   . HTN (hypertension)     Past Medical History:  Diagnosis Date  . Allergy   . Benign hematuria 12/2009   WORKUP WITH DR. NESI WAS NEGATIVE  . Depression   . Diabetes mellitus without complication (HCC)    prediabetes per pt  . Heart murmur   . HTN (hypertension)   . Obesity   . UTI (urinary tract infection), uncomplicated     Past Surgical History:  Procedure Laterality Date  . CHOLECYSTECTOMY  10/2010  . CYSTECTOMY     x 2 from chest    Social History  Substance Use Topics  . Smoking status: Never Smoker  . Smokeless tobacco: Never Used  . Alcohol use 0.0 oz/week     Comment: occasionally    Family History  Problem Relation Age of Onset  . Diabetes Mother   . Heart disease Mother     HAD TRIPLE BYPASS  . Hypertension Mother   . Kidney disease Father   . Breast cancer Sister   . Breast cancer Maternal Grandmother   . Cancer Maternal Grandmother     LYMPH NODE CANCER  . Cancer Maternal Aunt  COLORECTAL   . Colon cancer Neg Hx     Allergies  Allergen Reactions  . Wellbutrin [Bupropion]     Throat swelling   . Sulfonamide Derivatives     Unknown per pt    Medication list has been reviewed and updated.  Current Outpatient Prescriptions on File Prior to Visit  Medication Sig Dispense Refill  . ALPRAZolam (XANAX) 0.5 MG tablet take 1 tablet by mouth three times a day if needed for anxiety 90 tablet 1  . cetirizine (ZYRTEC ALLERGY) 10 MG tablet Take 1 tablet (10 mg total) by mouth daily. 30 tablet 1  . fluticasone (FLONASE) 50 MCG/ACT nasal spray Place 2 sprays into both nostrils daily. 16 g 0  . ibuprofen (ADVIL,MOTRIN) 600 MG tablet Take 600 mg by mouth every 6 (six) hours as needed for headache or moderate pain.    Marland Kitchen. lisinopril (PRINIVIL,ZESTRIL) 20 MG tablet Take 2 tablets (40 mg total) by mouth daily. 60 tablet 0  . magnesium 30 MG tablet  Take 60 mg by mouth as needed.     . metFORMIN (GLUCOPHAGE) 500 MG tablet Take 1 tablet (500 mg total) by mouth daily. 90 tablet 3  . naproxen (NAPROSYN) 500 MG tablet Take 1 tablet (500 mg total) by mouth 2 (two) times daily with a meal. 30 tablet 0  . norethindrone (MICRONOR,CAMILA,ERRIN) 0.35 MG tablet Take 1 tablet (0.35 mg total) by mouth daily. 3 Package 4  . oseltamivir (TAMIFLU) 75 MG capsule Take 1 capsule (75 mg total) by mouth every 12 (twelve) hours. 10 capsule 0  . RaNITidine HCl (ACID REDUCER PO) Take 1 tablet by mouth daily.    . [DISCONTINUED] norethindrone-ethinyl estradiol (JUNEL FE,GILDESS FE,LOESTRIN FE) 1-20 MG-MCG tablet Take 1 tablet by mouth daily. 1 Package 12   No current facility-administered medications on file prior to visit.     Review of Systems:  As per HPI- otherwise negative.   Physical Examination: Vitals:   12/03/16 1135  BP: (!) 151/102  Pulse: 75  Temp: 98.2 F (36.8 C)   Vitals:   12/03/16 1135  Weight: 237 lb (107.5 kg)  Height: 5\' 4"  (1.626 m)   Body mass index is 40.68 kg/m. Ideal Body Weight: Weight in (lb) to have BMI = 25: 145.3  GEN: WDWN, NAD, Non-toxic, A & O x 3, obese, otherwise looks well HEENT: Atraumatic, Normocephalic. Neck supple. No masses, No LAD.  Bilateral TM wnl, oropharynx normal.  PEERL,EOMI.   Nasal cavity inflamed on right side  Ears and Nose: No external deformity. CV: RRR, No M/G/R. No JVD. No thrill. No extra heart sounds. PULM: CTA B, no wheezes, crackles, rhonchi. No retractions. No resp. distress. No accessory muscle use. EXTR: No c/c/e NEURO Normal gait.  PSYCH: Normally interactive. Conversant. Not depressed or anxious appearing.  Calm demeanor.    Assessment and Plan: Sinus pain - Plan: predniSONE (DELTASONE) 20 MG tablet  Depression, unspecified depression type  GAD (generalized anxiety disorder)  Essential hypertension  Pre-diabetes  Here today to follow- up from recent ER visit for  likely flu She is doing better but not yet well Her sinuses are really bothering her Will treat with a short course or prednisone for sinus pressure Her BP is under ok control Most recent A1c ok, continue metformin. Glucose 101 at recent ER visit   Signed Abbe AmsterdamJessica Caesar Mannella, MD

## 2017-01-02 ENCOUNTER — Encounter: Payer: Self-pay | Admitting: Family Medicine

## 2017-01-04 MED ORDER — FLUOXETINE HCL 20 MG PO TABS: ORAL_TABLET | ORAL | 3 refills | Status: DC

## 2017-01-04 NOTE — Addendum Note (Signed)
Addended by: Abbe AmsterdamOPLAND, JESSICA C on: 01/04/2017 02:53 PM   Modules accepted: Orders

## 2017-01-30 ENCOUNTER — Other Ambulatory Visit: Payer: Self-pay | Admitting: Family Medicine

## 2017-01-30 NOTE — Telephone Encounter (Signed)
Requesting: ALPRAZolam (XANAX) 0.5 MG tablet Contract:  [facility] Last OV: 12/03/2016 Last Refill: 12/01/2016  Please Advise

## 2017-03-17 ENCOUNTER — Encounter: Payer: BLUE CROSS/BLUE SHIELD | Admitting: Women's Health

## 2017-03-31 ENCOUNTER — Encounter: Payer: Self-pay | Admitting: Women's Health

## 2017-03-31 ENCOUNTER — Ambulatory Visit (INDEPENDENT_AMBULATORY_CARE_PROVIDER_SITE_OTHER): Payer: BLUE CROSS/BLUE SHIELD | Admitting: Women's Health

## 2017-03-31 VITALS — BP 130/80 | Ht 64.0 in | Wt 230.0 lb

## 2017-03-31 DIAGNOSIS — Z01419 Encounter for gynecological examination (general) (routine) without abnormal findings: Secondary | ICD-10-CM | POA: Diagnosis not present

## 2017-03-31 DIAGNOSIS — Z3041 Encounter for surveillance of contraceptive pills: Secondary | ICD-10-CM | POA: Diagnosis not present

## 2017-03-31 MED ORDER — NORETHINDRONE 0.35 MG PO TABS: 1.0000 | ORAL_TABLET | Freq: Every day | ORAL | 4 refills | Status: DC

## 2017-03-31 NOTE — Patient Instructions (Signed)

## 2017-03-31 NOTE — Progress Notes (Signed)
Ann SettersWanda D Walter 05-16-1966 [id number]    History:    Presents for annual exam.  On Micronor with amenorrhea. History of menorrhagia good relief with Micronor. Having a few hot flushes and some vaginal dryness. 2013 severe depression and did have some depression last year currently on Prozac per primary care. History of benign hematuria. Primary care manages diabetes and hypertension. Normal Pap and mammogram history. 2017 negative colonoscopy. Has had a 20 pound weight gain in the past year.  Past medical history, past surgical history, family history and social history were all reviewed and documented in the EPIC chart. Works at [employer]. Stepdaughter 19 lives in [place]. Has 3 miniature donkeys.  ROS:  A ROS was performed and pertinent positives and negatives are included.  Exam:  Vitals:   03/31/17 1556  BP: 130/80  Weight: 230 lb (104.3 kg)  Height: 5\' 4"  (1.626 m)   Body mass index is 39.48 kg/m.   General appearance:  Normal Thyroid:  Symmetrical, normal in size, without palpable masses or nodularity. Respiratory  Auscultation:  Clear without wheezing or rhonchi Cardiovascular  Auscultation:  Regular rate, without rubs, murmurs or gallops  Edema/varicosities:  Not grossly evident Abdominal  Soft,nontender, without masses, guarding or rebound.  Liver/spleen:  No organomegaly noted  Hernia:  None appreciated  Skin  Inspection:  Grossly normal   Breasts: Examined lying and sitting.     Right: Without masses, retractions, discharge or axillary adenopathy.     Left: Without masses, retractions, discharge or axillary adenopathy. Gentitourinary   Inguinal/mons:  Normal without inguinal adenopathy  External genitalia:  Normal  BUS/Urethra/Skene's glands:  Normal  Vagina:  Normal  Cervix:  Normal  Uterus:   normal in size, shape and contour.  Midline and mobile  Adnexa/parametria:     Rt: Without masses or tenderness.   Lt: Without masses or tenderness.  Anus and  perineum: Normal  Digital rectal exam: Normal sphincter tone without palpated masses or tenderness  Assessment/Plan:  51 y.o. MWF G1 P0  for annual exam with no complaints.  Perimenopausal on Micronor with no bleeding Diabetes/hypertension/depression-primary care manages labs and meds Morbid obesity  Plan: Reviewed will check FSH at next annual exam instructed to stop Micronor the week prior to her office visit. Micronor prescription, proper use given and reviewed. Instructed to call if any bleeding or increased menopausal symptoms. SBE's, annual screening mammogram instructed to schedule. Increase exercise and decrease calories for weight loss encouraged. Reviewed importance of low carb diet. Vitamin D 1000 daily encouraged. Pap normal with negative HR HPV 2016, new screening guidelines reviewed.   Harrington Challengerancy J Fadia Marlar [facility]WHNP, 4:24 PM 03/31/2017

## 2017-04-18 ENCOUNTER — Other Ambulatory Visit: Payer: Self-pay | Admitting: Family Medicine

## 2017-04-20 ENCOUNTER — Other Ambulatory Visit: Payer: Self-pay | Admitting: Emergency Medicine

## 2017-04-20 MED ORDER — ALPRAZOLAM 0.5 MG PO TABS: 0.5000 mg | ORAL_TABLET | Freq: Three times a day (TID) | ORAL | 1 refills | Status: DC | PRN

## 2017-04-20 NOTE — Telephone Encounter (Signed)
[facility]: filled a 30 day supply on 03/17/17, then 01/30/17, 12/29/16

## 2017-04-20 NOTE — Telephone Encounter (Signed)
Requesting: ALPRAZolam (XANAX) 0.5 MG tablet Contract: 07/09/16 no control substance contract sign, UDS:  uds sample given, moderate risk next screen 10/08/16. Last OV: 12/03/16 Last Refill: 01/30/17  Please Advise

## 2017-04-27 ENCOUNTER — Ambulatory Visit (INDEPENDENT_AMBULATORY_CARE_PROVIDER_SITE_OTHER): Payer: BLUE CROSS/BLUE SHIELD | Admitting: Family Medicine

## 2017-04-27 ENCOUNTER — Encounter: Payer: Self-pay | Admitting: Family Medicine

## 2017-04-27 VITALS — BP 130/82 | HR 76 | Temp 98.5°F | Ht 64.0 in | Wt 232.0 lb

## 2017-04-27 DIAGNOSIS — I10 Essential (primary) hypertension: Secondary | ICD-10-CM | POA: Diagnosis not present

## 2017-04-27 DIAGNOSIS — F329 Major depressive disorder, single episode, unspecified: Secondary | ICD-10-CM | POA: Diagnosis not present

## 2017-04-27 DIAGNOSIS — F411 Generalized anxiety disorder: Secondary | ICD-10-CM

## 2017-04-27 DIAGNOSIS — E669 Obesity, unspecified: Secondary | ICD-10-CM

## 2017-04-27 DIAGNOSIS — R7303 Prediabetes: Secondary | ICD-10-CM

## 2017-04-27 DIAGNOSIS — Z9189 Other specified personal risk factors, not elsewhere classified: Secondary | ICD-10-CM

## 2017-04-27 DIAGNOSIS — F32A Depression, unspecified: Secondary | ICD-10-CM

## 2017-04-27 DIAGNOSIS — Z13220 Encounter for screening for lipoid disorders: Secondary | ICD-10-CM | POA: Diagnosis not present

## 2017-04-27 LAB — HEMOGLOBIN A1C: HEMOGLOBIN A1C: 5.9 % (ref 4.6–6.5)

## 2017-04-27 LAB — LIPID PANEL
CHOLESTEROL: 159 mg/dL (ref 0–200)
HDL: 61.6 mg/dL (ref >39.00)
LDL CALC: 84 mg/dL (ref 0–99)
NonHDL: 97.55
TRIGLYCERIDES: 68 mg/dL (ref 0.0–149.0)
Total CHOL/HDL Ratio: 3
VLDL: 13.6 mg/dL (ref 0.0–40.0)

## 2017-04-27 MED ORDER — METFORMIN HCL 500 MG PO TABS: 500.0000 mg | ORAL_TABLET | Freq: Every day | ORAL | 3 refills | Status: DC

## 2017-04-27 MED ORDER — LISINOPRIL 20 MG PO TABS: 20.0000 mg | ORAL_TABLET | Freq: Every day | ORAL | 3 refills | Status: DC

## 2017-04-27 NOTE — Patient Instructions (Signed)
It was nice to see you today!  I will be in touch with your labs, and we did your refills today You are still very young- please continue to work on taking care of yourself and try to become motivated again to eat a good diet.    We will do your UDS today in the labs as well as your blood-work

## 2017-04-27 NOTE — Progress Notes (Addendum)
[facility] [address], [address] [place], [place] [address] [phone] [phone]  Date:  04/27/2017   Name:  Ann SettersWanda D Walter   DOB:  24-Oct-1966   MRN:  [id number]  PCP:  Pearline Cablesopland, Jessica C, MD    Chief Complaint: Follow-up (Pt here for f/u visit. )   History of Present Illness:  Ann Walter is a 51 y.o. very pleasant female patient who presents with the following:  History of pre-diabetes, obesity, HTN, depression/ anxiety.  Here today for a follow-up visit She is on xanax, prozac, metformin, lisinopril 20mg  a day She is taking metformin for her pre-diabetes.  Admits that she stopped taking it for a while but is back on this. No SE of this medication that she has noted   States that she would like to lose weight, but does not stick with her diet.  She gets exercise at work only.    She did eat this am- 1/2 egg salad sandwich.  She is overdue for lipids so will get this for her today  She states that her mood is ok, but she wonders if she eats "so much junk because I don't really care what happens to me."  She is not feeling very motivated right now, but is now feeling sad or crying.    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  She does have to do a fair amount of bending and lifting, and notes that her back hurts more when her weight is higher as it is now  Wt Readings from Last 3 Encounters:  04/27/17 232 lb (105.2 kg)  03/31/17 230 lb (104.3 kg)  12/03/16 237 lb (107.5 kg)     Due for UDS today I refilled her xanax just a week ago- she is continuing to take this as directed, as well as her prozac Lab Results  Component Value Date   HGBA1C 5.9 07/09/2016   BP Readings from Last 3 Encounters:  04/27/17 130/82  03/31/17 130/80  12/03/16 138/88     Patient Active Problem List   Diagnosis Date Noted  . Pre-diabetes 11/22/2015  . Obesity 11/21/2015  . Ovarian cyst 02/28/2015  . Chest pain, atypical  03/08/2014  . Heart murmur 03/08/2014  . Heart palpitations 03/08/2014  . GAD (generalized anxiety disorder) 02/13/2014  . Depression   . HTN (hypertension)     Past Medical History:  Diagnosis Date  . Allergy   . Benign hematuria 12/2009   WORKUP WITH DR. NESI WAS NEGATIVE  . Depression   . Diabetes mellitus without complication (HCC)    prediabetes per pt  . Heart murmur   . HTN (hypertension)   . Obesity   . UTI (urinary tract infection), uncomplicated     Past Surgical History:  Procedure Laterality Date  . CHOLECYSTECTOMY  10/2010  . CYSTECTOMY     x 2 from chest    Social History  Substance Use Topics  . Smoking status: Never Smoker  . Smokeless tobacco: Never Used  . Alcohol use 0.0 oz/week     Comment: occasionally    Family History  Problem Relation Age of Onset  . Diabetes Mother   . Heart disease Mother        HAD TRIPLE BYPASS  . Hypertension Mother   . Kidney disease Father   . Breast cancer Sister   . Breast cancer Maternal Grandmother   . Cancer Maternal Grandmother  LYMPH NODE CANCER  . Cancer Maternal Aunt        COLORECTAL   . Colon cancer Neg Hx     Allergies  Allergen Reactions  . Wellbutrin [Bupropion]     Throat swelling   . Sulfonamide Derivatives     Unknown per pt    Medication list has been reviewed and updated.  Current Outpatient Prescriptions on File Prior to Visit  Medication Sig Dispense Refill  . ALPRAZolam (XANAX) 0.5 MG tablet Take 1 tablet (0.5 mg total) by mouth 3 (three) times daily as needed. for anxiety 90 tablet 1  . cetirizine (ZYRTEC ALLERGY) 10 MG tablet Take 1 tablet (10 mg total) by mouth daily. 30 tablet 1  . FLUoxetine (PROZAC) 20 MG tablet Take 1 tablet daily for 2 weeks, then increase to 2 tablets daily 60 tablet 3  . fluticasone (FLONASE) 50 MCG/ACT nasal spray Place 2 sprays into both nostrils daily. 16 g 0  . ibuprofen (ADVIL,MOTRIN) 600 MG tablet Take 600 mg by mouth every 6 (six) hours as  needed for headache or moderate pain.    . magnesium 30 MG tablet Take 60 mg by mouth as needed.     . naproxen (NAPROSYN) 500 MG tablet Take 1 tablet (500 mg total) by mouth 2 (two) times daily with a meal. 30 tablet 0  . norethindrone (MICRONOR,CAMILA,ERRIN) 0.35 MG tablet Take 1 tablet (0.35 mg total) by mouth daily. 3 Package 4  . RaNITidine HCl (ACID REDUCER PO) Take 1 tablet by mouth daily.    . [DISCONTINUED] norethindrone-ethinyl estradiol (JUNEL FE,GILDESS FE,LOESTRIN FE) 1-20 MG-MCG tablet Take 1 tablet by mouth daily. 1 Package 12   No current facility-administered medications on file prior to visit.     Review of Systems:  As per HPI- otherwise negative.   No fever, chills, nausea, vomiting, diarrhea.  She is not losing weight but is also not really trying right now  Physical Examination: Vitals:   04/27/17 0852  BP: 130/82  Pulse: 76  Temp: 98.5 F (36.9 C)   Vitals:   04/27/17 0852  Weight: 232 lb (105.2 kg)  Height: 5\' 4"  (1.626 m)   Body mass index is 39.82 kg/m. Ideal Body Weight: Weight in (lb) to have BMI = 25: 145.3  GEN: WDWN, NAD, Non-toxic, A & O x 3, obese, otherwise looks well HEENT: Atraumatic, Normocephalic. Neck supple. No masses, No LAD. Ears and Nose: No external deformity. CV: RRR, No M/G/R. No JVD. No thrill. No extra heart sounds. PULM: CTA B, no wheezes, crackles, rhonchi. No retractions. No resp. distress. No accessory muscle use. EXTR: No c/c/e NEURO Normal gait.  PSYCH: Normally interactive. Conversant. Not depressed or anxious appearing.  Calm demeanor.    Assessment and Plan: Pre-diabetes - Plan: Hemoglobin A1c, metFORMIN (GLUCOPHAGE) 500 MG tablet  Depression, unspecified depression type  GAD (generalized anxiety disorder)  Essential hypertension - Plan: lisinopril (PRINIVIL,ZESTRIL) 20 MG tablet  Screening for hyperlipidemia - Plan: Lipid panel  Obesity (BMI 30-39.9)  Lack of motivation  Refilled her lisinopril and  metformin today- will get lipids and A1c today BP is under ok control She is not sticking with her diet or watching her weight at all- she just does not feel motivated right now.  Discussed with her- offered info on the healthy weight and wellness center and discussed re-framing her efforts into not depriving herself of junk, but of trying to give her body good food for wellness She will try to work on  this and will consider going to the weight and wellness center Will plan further follow- up pending labs. UDS today Plan to follow-up in 6 months   Signed Abbe Amsterdam, MD  Received labs  Results for orders placed or performed in visit on 04/27/17  Hemoglobin A1c  Result Value Ref Range   Hgb A1c MFr Bld 5.9 4.6 - 6.5 %  Lipid panel  Result Value Ref Range   Cholesterol 159 0 - 200 mg/dL   Triglycerides 16.1 0.0 - 149.0 mg/dL   HDL 09.60 >45.40 mg/dL   VLDL 98.1 0.0 - 19.1 mg/dL   LDL Cholesterol 84 0 - 99 mg/dL   Total CHOL/HDL Ratio 3    NonHDL 97.55

## 2017-05-11 ENCOUNTER — Telehealth: Payer: Self-pay | Admitting: Family Medicine

## 2017-05-11 NOTE — Telephone Encounter (Signed)
Called her back She is sick right now-has a hoarse voice On Friday she was going up some steps and missed a step.  She "smacked my head on the door frame."  Today is Monday She notes no LOC, but she does still have a headache. She has had nausea but thinks this is due to her current illness Advised that as per earlier advice I do think a face to face evaluation is necessary to address this concern. If she is feeling worse, having more HA or any vomiting she should go to the ER Otherwise we are glad to see her at the office, or she could also seek attention at urgent care She states understanding

## 2017-05-11 NOTE — Telephone Encounter (Signed)
Pt called in to speak with provider. She said that she fell on Friday and hit her head. She said that she don't have a knot but would like to speak with provider.   I informed pt that she may have to come in to be access to assist further. She said that she would like a call back first.

## 2017-05-22 ENCOUNTER — Other Ambulatory Visit: Payer: Self-pay | Admitting: Family Medicine

## 2017-06-10 NOTE — Progress Notes (Signed)
[facility] [address], [address] [place], [place] [address] [phone] [phone]  Date:  06/11/2017   Name:  Ann SettersWanda D Walter   DOB:  12-Mar-1966   MRN:  [id number]  PCP:  Pearline Cablesopland, Fradel Baldonado C, MD    Chief Complaint: Sore Throat   History of Present Illness:  Ann Walter is a 51 y.o. very pleasant female patient who presents with the following:  I saw her just over a month ago to discuss her chronic health concerns.   Here today due to acute illness. She has noted a "raw throat"  About a month ago she seemed to have a cold- it got better except for a persistent cough She feels like her throat may be raw from coughing so much The cough is generally not productive She has not noted any fever No runny nose or sneezing  No sick contact  She has tried robitussin, advil OTC Her cough is worse when she goes into and out of the cooler at work- the cold air makes her feel worse No GI symptoms No rash  Patient Active Problem List   Diagnosis Date Noted  . Pre-diabetes 11/22/2015  . Obesity 11/21/2015  . Ovarian cyst 02/28/2015  . Chest pain, atypical 03/08/2014  . Heart murmur 03/08/2014  . Heart palpitations 03/08/2014  . GAD (generalized anxiety disorder) 02/13/2014  . Depression   . HTN (hypertension)     Past Medical History:  Diagnosis Date  . Allergy   . Benign hematuria 12/2009   WORKUP WITH DR. NESI WAS NEGATIVE  . Depression   . Diabetes mellitus without complication (HCC)    prediabetes per pt  . Heart murmur   . HTN (hypertension)   . Obesity   . UTI (urinary tract infection), uncomplicated     Past Surgical History:  Procedure Laterality Date  . CHOLECYSTECTOMY  10/2010  . CYSTECTOMY     x 2 from chest    Social History  Substance Use Topics  . Smoking status: Never Smoker  . Smokeless tobacco: Never Used  . Alcohol use 0.0 oz/week     Comment: occasionally    Family History  Problem Relation Age  of Onset  . Diabetes Mother   . Heart disease Mother        HAD TRIPLE BYPASS  . Hypertension Mother   . Kidney disease Father   . Breast cancer Sister   . Breast cancer Maternal Grandmother   . Cancer Maternal Grandmother        LYMPH NODE CANCER  . Cancer Maternal Aunt        COLORECTAL   . Colon cancer Neg Hx     Allergies  Allergen Reactions  . Wellbutrin [Bupropion]     Throat swelling   . Sulfonamide Derivatives     Unknown per pt    Medication list has been reviewed and updated.  Current Outpatient Prescriptions on File Prior to Visit  Medication Sig Dispense Refill  . ALPRAZolam (XANAX) 0.5 MG tablet Take 1 tablet (0.5 mg total) by mouth 3 (three) times daily as needed. for anxiety 90 tablet 1  . cetirizine (ZYRTEC ALLERGY) 10 MG tablet Take 1 tablet (10 mg total) by mouth daily. 30 tablet 1  . FLUoxetine (PROZAC) 20 MG tablet TAKE 1 TABLET BY MOUTH DAILY FOR  2  WEEKS,  THEN  INCREASE  TO  2  TABLETS  DAILY 60 tablet 3  .  ibuprofen (ADVIL,MOTRIN) 600 MG tablet Take 600 mg by mouth every 6 (six) hours as needed for headache or moderate pain.    Marland Kitchen lisinopril (PRINIVIL,ZESTRIL) 20 MG tablet Take 1 tablet (20 mg total) by mouth daily. 90 tablet 3  . metFORMIN (GLUCOPHAGE) 500 MG tablet Take 1 tablet (500 mg total) by mouth daily. 90 tablet 3  . naproxen (NAPROSYN) 500 MG tablet Take 1 tablet (500 mg total) by mouth 2 (two) times daily with a meal. 30 tablet 0  . norethindrone (MICRONOR,CAMILA,ERRIN) 0.35 MG tablet Take 1 tablet (0.35 mg total) by mouth daily. 3 Package 4  . RaNITidine HCl (ACID REDUCER PO) Take 1 tablet by mouth daily.    . fluticasone (FLONASE) 50 MCG/ACT nasal spray Place 2 sprays into both nostrils daily. (Patient not taking: Reported on 06/11/2017) 16 g 0  . magnesium 30 MG tablet Take 60 mg by mouth as needed.     . [DISCONTINUED] norethindrone-ethinyl estradiol (JUNEL FE,GILDESS FE,LOESTRIN FE) 1-20 MG-MCG tablet Take 1 tablet by mouth daily. 1  Package 12   No current facility-administered medications on file prior to visit.     Review of Systems:  As per HPI- otherwise negative. No fever No earache    Physical Examination: Vitals:   06/11/17 1750  BP: 136/84  Pulse: 95  Temp: 98.5 F (36.9 C)  SpO2: 100%   Vitals:   06/11/17 1750  Weight: 206 lb 9.6 oz (93.7 kg)  Height: 5\' 4"  (1.626 m)   Body mass index is 35.46 kg/m. Ideal Body Weight:   GEN: WDWN, NAD, Non-toxic, A & O x 3, obese, otherwise looks well HEENT: Atraumatic, Normocephalic. Neck supple. No masses, No LAD. Ears and Nose: No external deformity. CV: RRR, No M/G/R. No JVD. No thrill. No extra heart sounds. PULM: CTA B, no wheezes, crackles, rhonchi. No retractions. No resp. distress. No accessory muscle use. ABD: S, NT, ND, +BS. No rebound. No HSM. EXTR: No c/c/e NEURO Normal gait.  PSYCH: Normally interactive. Conversant. Not depressed or anxious appearing.  Calm demeanor.    Assessment and Plan: Acute bronchitis, unspecified organism - Plan: azithromycin (ZITHROMAX) 250 MG tablet, benzonatate (TESSALON) 100 MG capsule  Here today with complaint of a cough for one month and a raw throat Treat for bronchitis with azithromycin, tess salon prn for cough She will let me know if not feeling better in the next few days- Sooner if worse.     Signed Abbe Amsterdam, MD

## 2017-06-11 ENCOUNTER — Ambulatory Visit (INDEPENDENT_AMBULATORY_CARE_PROVIDER_SITE_OTHER): Payer: BLUE CROSS/BLUE SHIELD | Admitting: Family Medicine

## 2017-06-11 VITALS — BP 136/84 | HR 95 | Temp 98.5°F | Ht 64.0 in | Wt 206.6 lb

## 2017-06-11 DIAGNOSIS — J209 Acute bronchitis, unspecified: Secondary | ICD-10-CM | POA: Diagnosis not present

## 2017-06-11 MED ORDER — BENZONATATE 100 MG PO CAPS: 100.0000 mg | ORAL_CAPSULE | Freq: Three times a day (TID) | ORAL | 0 refills | Status: DC | PRN

## 2017-06-11 MED ORDER — AZITHROMYCIN 250 MG PO TABS: ORAL_TABLET | ORAL | 0 refills | Status: DC

## 2017-06-11 NOTE — Patient Instructions (Signed)
We are going to treat you for bronchitis- use the azithromycin antibiotic as directed, and the tessalon perles as needed for cough Let me know if you are not feeling better in the next several days- Sooner if worse.

## 2017-06-16 ENCOUNTER — Encounter: Payer: Self-pay | Admitting: Family Medicine

## 2017-06-19 ENCOUNTER — Other Ambulatory Visit: Payer: Self-pay | Admitting: Family Medicine

## 2017-06-19 NOTE — Telephone Encounter (Signed)
Pt is requesting refill on alprazolam 0.5mg.  

## 2017-06-21 NOTE — Progress Notes (Signed)
[facility] [address], [address] [place], [place] [address] [phone]  Date:  06/24/2017   Name:  Ann Walter   DOB:  1966/06/13   MRN:  [id number]  PCP:  Pearline Cables, MD    Chief Complaint: Cyst (Back of head)   History of Present Illness:  Ann Walter is a 51 y.o. very pleasant female patient who presents with the following:  Here today with concern of a skin lesion/ bump on her head She has noted a knot on her head for "years, but the past few days it's hurt, has gotten bigger."  Is not red, is not draining.  She just was not sure what it might be.   It hurts to press on the area, or if she lays her head on the spot  It had gotten bigger for a few days, but now seems to be getting smaller again.  She is not aware of any injury, infection, toothache, etc that might be causing this   She is otherwise feeling ok except she is still getting over her recent bronchitis No fever or chills No ST No rash   Patient Active Problem List   Diagnosis Date Noted  . Pre-diabetes 11/22/2015  . Obesity 11/21/2015  . Ovarian cyst 02/28/2015  . Chest pain, atypical 03/08/2014  . Heart murmur 03/08/2014  . Heart palpitations 03/08/2014  . GAD (generalized anxiety disorder) 02/13/2014  . Depression   . HTN (hypertension)     Past Medical History:  Diagnosis Date  . Allergy   . Benign hematuria 12/2009   WORKUP WITH DR. NESI WAS NEGATIVE  . Depression   . Diabetes mellitus without complication (HCC)    prediabetes per pt  . Heart murmur   . HTN (hypertension)   . Obesity   . UTI (urinary tract infection), uncomplicated     Past Surgical History:  Procedure Laterality Date  . CHOLECYSTECTOMY  10/2010  . CYSTECTOMY     x 2 from chest    Social History  Substance Use Topics  . Smoking status: Never Smoker  . Smokeless tobacco: Never Used  . Alcohol use 0.0 oz/week     Comment: occasionally    Family  History  Problem Relation Age of Onset  . Diabetes Mother   . Heart disease Mother        HAD TRIPLE BYPASS  . Hypertension Mother   . Kidney disease Father   . Breast cancer Sister   . Breast cancer Maternal Grandmother   . Cancer Maternal Grandmother        LYMPH NODE CANCER  . Cancer Maternal Aunt        COLORECTAL   . Colon cancer Neg Hx     Allergies  Allergen Reactions  . Wellbutrin [Bupropion]     Throat swelling   . Sulfonamide Derivatives     Unknown per pt    Medication list has been reviewed and updated.  Current Outpatient Prescriptions on File Prior to Visit  Medication Sig Dispense Refill  . ALPRAZolam (XANAX) 0.5 MG tablet TAKE 1 TABLET BY MOUTH THREE TIMES DAILY AS NEEDED 90 tablet 1  . benzonatate (TESSALON) 100 MG capsule Take 1 capsule (100 mg total) by mouth 3 (three) times daily as needed for cough. 40 capsule 0  . cetirizine (ZYRTEC ALLERGY) 10 MG tablet Take 1 tablet (10 mg total) by mouth daily. 30 tablet 1  .  ibuprofen (ADVIL,MOTRIN) 600 MG tablet Take 600 mg by mouth every 6 (six) hours as needed for headache or moderate pain.    Marland Kitchen lisinopril (PRINIVIL,ZESTRIL) 20 MG tablet Take 1 tablet (20 mg total) by mouth daily. 90 tablet 3  . magnesium 30 MG tablet Take 60 mg by mouth as needed.     . metFORMIN (GLUCOPHAGE) 500 MG tablet Take 1 tablet (500 mg total) by mouth daily. 90 tablet 3  . naproxen (NAPROSYN) 500 MG tablet Take 1 tablet (500 mg total) by mouth 2 (two) times daily with a meal. 30 tablet 0  . norethindrone (MICRONOR,CAMILA,ERRIN) 0.35 MG tablet Take 1 tablet (0.35 mg total) by mouth daily. 3 Package 4  . RaNITidine HCl (ACID REDUCER PO) Take 1 tablet by mouth daily.    Marland Kitchen FLUoxetine (PROZAC) 20 MG tablet TAKE 1 TABLET BY MOUTH DAILY FOR  2  WEEKS,  THEN  INCREASE  TO  2  TABLETS  DAILY (Patient not taking: Reported on 06/24/2017) 60 tablet 3  . fluticasone (FLONASE) 50 MCG/ACT nasal spray Place 2 sprays into both nostrils daily. (Patient not  taking: Reported on 06/11/2017) 16 g 0  . [DISCONTINUED] norethindrone-ethinyl estradiol (JUNEL FE,GILDESS FE,LOESTRIN FE) 1-20 MG-MCG tablet Take 1 tablet by mouth daily. 1 Package 12   No current facility-administered medications on file prior to visit.     Review of Systems:  As per HPI- otherwise negative. Wt Readings from Last 3 Encounters:  06/24/17 236 lb 3.2 oz (107.1 kg)  06/11/17 206 lb 9.6 oz (93.7 kg)  04/27/17 232 lb (105.2 kg)   No weight loss    Physical Examination: Vitals:   06/24/17 1017  BP: 132/86  Pulse: 81  Temp: 98.2 F (36.8 C)  SpO2: 98%   Vitals:   06/24/17 1017  Weight: 236 lb 3.2 oz (107.1 kg)  Height: 5\' 4"  (1.626 m)   Body mass index is 40.54 kg/m. Ideal Body Weight: Weight in (lb) to have BMI = 25: 145.3  GEN: WDWN, NAD, Non-toxic, A & O x 3, obese, otherwise looks well HEENT: Atraumatic, Normocephalic. Neck supple. No masses, No LAD.  Bilateral TM wnl, oropharynx normal.  PEERL,EOMI.   She points out an area of concern on her right sided posterior skull.  However I am not able to appreciate any abnl at this location.  No redness, swelling, mass noted  Ears and Nose: No external deformity. CV: RRR, No M/G/R. No JVD. No thrill. No extra heart sounds. PULM: CTA B, no wheezes, crackles, rhonchi. No retractions. No resp. distress. No accessory muscle use.Marland Kitchen EXTR: No c/c/e NEURO Normal gait.  PSYCH: Normally interactive. Conversant. Not depressed or anxious appearing.  Calm demeanor.    Assessment and Plan: Skin mass  Need for influenza vaccination - Plan: Flu Vaccine QUAD 36+ mos IM  Here today with concern about a bump on her skin- however at this time I cannot appreciate any abnl.  ?may have been a lymph node which is now receding.  In any case, she will let me know if this comes back again or continues to bother her Flu shot today  Signed Abbe Amsterdam, MD

## 2017-06-24 ENCOUNTER — Ambulatory Visit (INDEPENDENT_AMBULATORY_CARE_PROVIDER_SITE_OTHER): Payer: BLUE CROSS/BLUE SHIELD | Admitting: Family Medicine

## 2017-06-24 VITALS — BP 132/86 | HR 81 | Temp 98.2°F | Ht 64.0 in | Wt 236.2 lb

## 2017-06-24 DIAGNOSIS — Z23 Encounter for immunization: Secondary | ICD-10-CM | POA: Diagnosis not present

## 2017-06-24 DIAGNOSIS — R229 Localized swelling, mass and lump, unspecified: Secondary | ICD-10-CM | POA: Diagnosis not present

## 2017-06-24 NOTE — Patient Instructions (Addendum)
I do see see or feel anything abnormal on your scalp at this time.  However, if it swells up again or otherwise is worrying you please let me know! You got your flu shot today  Please see me in 3-4 months for a recheck visit

## 2017-08-09 NOTE — Progress Notes (Signed)
[facility] [address], [address] [place], [place] [address] [phone]  Date:  08/10/2017   Name:  Ann Walter   DOB:  02-Sep-1966   MRN:  [id number]  PCP:  Pearline Cables, MD    Chief Complaint: Bronchitis (ongoing bronchitis since 06/11/2017 pt states that sx's have not improved. )   History of Present Illness:  Ann Walter is a 51 y.o. very pleasant female patient who presents with the following:  Here today with concern of illness- persistent cough I saw her for bronchitis in August, treated with zpack She notes that the cough never went away from her last visit However, she is not really feeling ill, just still coughing No fever She notes a little burning in her throat but this does not seem like GERD that she had in the past She may cough so hard that she vomites on rare occasion No real ST The cough s not productive She may have a little wheezing  She has tried some mucinex, and nyquil Never a smoker She did get a new dog 2-3 months ago - she has never been allergic to dogs in the past   She notes that she needs a refill of her xanax I gave her a month supply with 2 RF in August  NCCSR:  She filled a 30 day supply of alprazolam on 9/27 She reports that her new puppy knocked over the rest of her bottle and she threw away the pills that went on the floor.  She has a few left but wonders if I can refill for her today Counseled her that I can, but she may not be able to refill this early per pharmacy and insurance    Lab Results  Component Value Date   HGBA1C 5.9 04/27/2017     Patient Active Problem List   Diagnosis Date Noted  . Pre-diabetes 11/22/2015  . Obesity 11/21/2015  . Ovarian cyst 02/28/2015  . Chest pain, atypical 03/08/2014  . Heart murmur 03/08/2014  . Heart palpitations 03/08/2014  . GAD (generalized anxiety disorder) 02/13/2014  . Depression   . HTN (hypertension)     Past  Medical History:  Diagnosis Date  . Allergy   . Benign hematuria 12/2009   WORKUP WITH DR. NESI WAS NEGATIVE  . Depression   . Diabetes mellitus without complication (HCC)    prediabetes per pt  . Heart murmur   . HTN (hypertension)   . Obesity   . UTI (urinary tract infection), uncomplicated     Past Surgical History:  Procedure Laterality Date  . CHOLECYSTECTOMY  10/2010  . CYSTECTOMY     x 2 from chest    Social History  Substance Use Topics  . Smoking status: Never Smoker  . Smokeless tobacco: Never Used  . Alcohol use 0.0 oz/week     Comment: occasionally    Family History  Problem Relation Age of Onset  . Diabetes Mother   . Heart disease Mother        HAD TRIPLE BYPASS  . Hypertension Mother   . Kidney disease Father   . Breast cancer Sister   . Breast cancer Maternal Grandmother   . Cancer Maternal Grandmother        LYMPH NODE CANCER  . Cancer Maternal Aunt        COLORECTAL   . Colon cancer Neg Hx     Allergies  Allergen Reactions  .  Wellbutrin [Bupropion]     Throat swelling   . Sulfonamide Derivatives     Unknown per pt    Medication list has been reviewed and updated.  Current Outpatient Prescriptions on File Prior to Visit  Medication Sig Dispense Refill  . ALPRAZolam (XANAX) 0.5 MG tablet TAKE 1 TABLET BY MOUTH THREE TIMES DAILY AS NEEDED 90 tablet 1  . benzonatate (TESSALON) 100 MG capsule Take 1 capsule (100 mg total) by mouth 3 (three) times daily as needed for cough. 40 capsule 0  . cetirizine (ZYRTEC ALLERGY) 10 MG tablet Take 1 tablet (10 mg total) by mouth daily. 30 tablet 1  . FLUoxetine (PROZAC) 20 MG tablet TAKE 1 TABLET BY MOUTH DAILY FOR  2  WEEKS,  THEN  INCREASE  TO  2  TABLETS  DAILY 60 tablet 3  . fluticasone (FLONASE) 50 MCG/ACT nasal spray Place 2 sprays into both nostrils daily. 16 g 0  . ibuprofen (ADVIL,MOTRIN) 600 MG tablet Take 600 mg by mouth every 6 (six) hours as needed for headache or moderate pain.    Marland Kitchen  lisinopril (PRINIVIL,ZESTRIL) 20 MG tablet Take 1 tablet (20 mg total) by mouth daily. 90 tablet 3  . magnesium 30 MG tablet Take 60 mg by mouth as needed.     . metFORMIN (GLUCOPHAGE) 500 MG tablet Take 1 tablet (500 mg total) by mouth daily. 90 tablet 3  . naproxen (NAPROSYN) 500 MG tablet Take 1 tablet (500 mg total) by mouth 2 (two) times daily with a meal. 30 tablet 0  . norethindrone (MICRONOR,CAMILA,ERRIN) 0.35 MG tablet Take 1 tablet (0.35 mg total) by mouth daily. 3 Package 4  . RaNITidine HCl (ACID REDUCER PO) Take 1 tablet by mouth daily.    . [DISCONTINUED] norethindrone-ethinyl estradiol (JUNEL FE,GILDESS FE,LOESTRIN FE) 1-20 MG-MCG tablet Take 1 tablet by mouth daily. 1 Package 12   No current facility-administered medications on file prior to visit.     Review of Systems:  As per HPI- otherwise negative. No fever or chills No CP or SOB   Physical Examination: Vitals:   08/10/17 1343  BP: 124/84  Pulse: 80  Temp: 98.6 F (37 C)  SpO2: 98%   Vitals:   08/10/17 1343  Weight: 240 lb 3.2 oz (109 kg)  Height:  (1.626 m)   Body mass index is 41.23 kg/m. Ideal Body Weight: Weight in (lb) to have BMI = 25: 145.3  GEN: WDWN, NAD, Non-toxic, A & O x 3, obese, looks well HEENT: Atraumatic, Normocephalic. Neck supple. No masses, No LAD.  Bilateral TM wnl, oropharynx normal.  PEERL,EOMI.   Ears and Nose: No external deformity. CV: RRR, No M/G/R. No JVD. No thrill. No extra heart sounds. PULM: CTA B, no wheezes, crackles, rhonchi. No retractions. No resp. distress. No accessory muscle use. EXTR: No c/c/e NEURO Normal gait.  PSYCH: Normally interactive. Conversant. Not depressed or anxious appearing.  Calm demeanor.   Dg Chest 2 View  Result Date: 08/10/2017 CLINICAL DATA:  Cough and chest pain EXAM: CHEST  2 VIEW COMPARISON:  November 29, 2016 FINDINGS: Lungs are clear. Heart size and pulmonary vascularity are normal. No adenopathy. No pneumothorax. No bone  lesions. IMPRESSION: No edema or consolidation. Electronically Signed   By: Bretta Bang III M.D.   On: 08/10/2017 14:15    Assessment and Plan: Persistent cough - Plan: DG Chest 2 View, ALPRAZolam (XANAX) 0.5 MG tablet, benzonatate (TESSALON) 100 MG capsule  GAD (generalized anxiety disorder) - Plan:  predniSONE (DELTASONE) 20 MG tablet  Here today with a persistent cough for a couple of months CXR is benign today. Already treated with azithromycin  Will use prednisone 20 mg daily for 5-7 days  Refilled her xanax- cautioned her against suddenly stopping this medication. She has some left but will let me know if she runs out prior to when she is able to refill  See patient instructions for more details.      Signed Abbe Amsterdam, MD

## 2017-08-10 ENCOUNTER — Ambulatory Visit (HOSPITAL_BASED_OUTPATIENT_CLINIC_OR_DEPARTMENT_OTHER)
Admission: RE | Admit: 2017-08-10 | Discharge: 2017-08-10 | Disposition: A | Discharge status: Home / Self Care | Payer: BLUE CROSS/BLUE SHIELD | Source: Ambulatory Visit | Attending: Family Medicine | Admitting: Family Medicine

## 2017-08-10 ENCOUNTER — Ambulatory Visit (INDEPENDENT_AMBULATORY_CARE_PROVIDER_SITE_OTHER): Payer: BLUE CROSS/BLUE SHIELD | Admitting: Family Medicine

## 2017-08-10 VITALS — BP 124/84 | HR 80 | Temp 98.6°F | Ht 64.0 in | Wt 240.2 lb

## 2017-08-10 DIAGNOSIS — F411 Generalized anxiety disorder: Secondary | ICD-10-CM | POA: Diagnosis not present

## 2017-08-10 DIAGNOSIS — R05 Cough: Secondary | ICD-10-CM

## 2017-08-10 DIAGNOSIS — R053 Chronic cough: Secondary | ICD-10-CM

## 2017-08-10 MED ORDER — BENZONATATE 100 MG PO CAPS: 100.0000 mg | ORAL_CAPSULE | Freq: Three times a day (TID) | ORAL | 0 refills | Status: DC | PRN

## 2017-08-10 MED ORDER — PREDNISONE 20 MG PO TABS: ORAL_TABLET | ORAL | 0 refills | Status: DC

## 2017-08-10 MED ORDER — ALPRAZOLAM 0.5 MG PO TABS: 0.5000 mg | ORAL_TABLET | Freq: Three times a day (TID) | ORAL | 1 refills | Status: DC | PRN

## 2017-08-10 NOTE — Patient Instructions (Signed)
It was good to see you today- we are going to try a course of prednisone for your cough Take the prednisone 20 mg one pill a day for a week Also, use the tessalon perles as needed for cough I gave you a refill of your xanax today as well Let me know if your cough is not improving in a week or so- Sooner if worse.

## 2017-09-03 ENCOUNTER — Encounter: Payer: Self-pay | Admitting: Family Medicine

## 2017-09-03 DIAGNOSIS — R053 Chronic cough: Secondary | ICD-10-CM

## 2017-09-03 DIAGNOSIS — R05 Cough: Secondary | ICD-10-CM

## 2017-09-14 ENCOUNTER — Encounter: Payer: Self-pay | Admitting: Internal Medicine

## 2017-09-14 ENCOUNTER — Ambulatory Visit (INDEPENDENT_AMBULATORY_CARE_PROVIDER_SITE_OTHER): Payer: BLUE CROSS/BLUE SHIELD | Admitting: Internal Medicine

## 2017-09-14 VITALS — BP 150/92 | HR 84 | Ht 63.0 in | Wt 245.6 lb

## 2017-09-14 DIAGNOSIS — I10 Essential (primary) hypertension: Secondary | ICD-10-CM

## 2017-09-14 DIAGNOSIS — R05 Cough: Secondary | ICD-10-CM

## 2017-09-14 DIAGNOSIS — R058 Other specified cough: Secondary | ICD-10-CM

## 2017-09-14 MED ORDER — IRBESARTAN 300 MG PO TABS: 300.0000 mg | ORAL_TABLET | Freq: Every day | ORAL | 1 refills | Status: DC

## 2017-09-14 MED ORDER — PANTOPRAZOLE SODIUM 40 MG PO TBEC: 40.0000 mg | DELAYED_RELEASE_TABLET | Freq: Every day | ORAL | 2 refills | Status: DC

## 2017-09-14 MED ORDER — BENZONATATE 200 MG PO CAPS: 200.0000 mg | ORAL_CAPSULE | Freq: Three times a day (TID) | ORAL | 2 refills | Status: DC | PRN

## 2017-09-14 NOTE — Patient Instructions (Addendum)
Stop lisinopril and start ibesartan  300 mg one half daily and your cough should gradually improve   Tessalon 200 mg up to 3 x daily for the cough as needed   Pantoprazole (protonix) 40 mg   Take  30-60 min before first meal of the day and Acid reduce 150  mg one @  bedtime until 100% better x 2 weeks then resume acid reducer like you are now = 150 mg daily   GERD (REFLUX)  is an extremely common cause of respiratory symptoms just like yours , many times with no obvious heartburn at all.    It can be treated with medication, but also with lifestyle changes including elevation of the head of your bed (ideally with 6 inch  bed blocks),  Smoking cessation, avoidance of late meals, excessive alcohol, and avoid fatty foods, chocolate, peppermint, colas, red wine, and acidic juices such as orange juice.  NO MINT OR MENTHOL PRODUCTS SO NO COUGH DROPS   USE SUGARLESS CANDY INSTEAD (Jolley ranchers or Stover's or Life Savers) or even ice chips will also do - the key is to swallow to prevent all throat clearing. NO OIL BASED VITAMINS - use powdered substitutes.     If you are satisfied with your treatment plan,  let your doctor know and he/she can either refill your medications or you can return here when your prescription runs out.     If in any way you are not 100% satisfied,  please tell us.  If 100% better, tell your friends!  Pulmonary follow up is as needed

## 2017-09-14 NOTE — Progress Notes (Signed)
Subjective:     Patient ID: Ann Walter, female   DOB: Aug 16, 1966,    MRN: [id number]  HPI  [age] yowf never smoker onset of stuffy nose age 51 eval by allergist [facility] > allergic ragweed, cats, pollen > took shots x sev years and improved s cough / wheeze until Aug 2018 bad cough s lot of sinus assoc with subj wheeze > prednisone x 2 no better at all even temporarily and continue with daily cough since so referred to pulmonary clinic 09/14/2017 by Dr   Dallas [place].     09/14/2017 1st [facility] office visit/ Ann Walter   Chief Complaint  Patient presents with  . 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                            With trouble breathing? No no   Do you have choking episodes when you cannot  Get enough air, gasping for air ?              gagging   Do you usually cough when you lie down into  The bed, or when you just lie down to rest ?                          yes   Do you usually cough after meals or eating?         no   Do you cough when (or after) you bend over?    no   GERD SCORE     Kouffman Reflux v Neurogenic Cough Differentiator Neurogenic   Do you more-or-less cough all day long? sporadic   Does change of temperature make you cough? Probably    Does laughing or chuckling cause you to cough? yes   Do fumes (perfume, automobile  fumes, burned  Toast, etc.,) cause you to cough ?      No    Does speaking, singing, or talking on the phone cause you to cough   ?               No    Neurogenic/Airway score       No obvious  Other day to day or daytime variability or assoc excess/ purulent sputum or mucus plugs or hemoptysis or cp or chest tightness, subjective wheeze or overt sinus or hb symptoms. No unusual exp hx or h/o childhood pna/ asthma or knowledge of premature birth.  Sleeping ok flat without nocturnal  or early am exacerbation  of respiratory  c/o's or need for noct saba. Also denies any obvious fluctuation of symptoms with weather or environmental changes or other aggravating or alleviating factors except as outlined above  Current Allergies, Complete Past Medical History, Past Surgical History, Family History, and Social History were reviewed in [employer] record.  ROS  The following are not active complaints unless bolded Hoarseness, sore throat, dysphagia, dental problems, itching, sneezing,  nasal congestion or discharge of excess mucus or purulent secretions, ear ache,   fever, chills, sweats, unintended wt loss or wt gain, classically pleuritic or exertional cp,  orthopnea pnd or leg swelling, presyncope, palpitations, abdominal pain, anorexia, nausea, vomiting, diarrhea  or change in bowel habits or change in bladder habits, change in stools or change in urine, dysuria, hematuria,  rash, arthralgias, visual complaints, headache, numbness, weakness or ataxia or problems with walking or coordination,  change in mood/affect or memory.        Current Meds  Medication Sig  . ALPRAZolam (XANAX) 0.5 MG tablet Take 1 tablet (0.5 mg total) by mouth 3 (three) times daily as needed.  Marland Kitchen FLUoxetine (PROZAC) 20 MG tablet TAKE 1 TABLET BY MOUTH DAILY FOR  2  WEEKS,  THEN  INCREASE  TO  2  TABLETS  DAILY  . ibuprofen (ADVIL,MOTRIN) 600 MG tablet Take 600 mg by mouth every 6 (six) hours as needed for  headache or moderate pain.  . metFORMIN (GLUCOPHAGE) 500 MG tablet Take 1 tablet (500 mg total) by mouth daily.  . norethindrone (MICRONOR,CAMILA,ERRIN) 0.35 MG tablet Take 1 tablet (0.35 mg total) by mouth daily.  . RaNITidine HCl (ACID REDUCER PO) Take 1 tablet by mouth daily.  . [  benzonatate (TESSALON) 100 MG capsule Take 1 capsule (100 mg total) by mouth 3 (three) times daily as needed for cough.  Algis Downs ] benzonatate (TESSALON) 100 MG capsule Take 1 capsule (100 mg total) by mouth 3 (three) times daily as needed for cough.  . [ etirizine (ZYRTEC ALLERGY) 10 MG tablet Take 1 tablet (10 mg total) by mouth daily.  . [ ]  fluticasone (FLONASE) 50 MCG/ACT nasal spray Place 2 sprays into both nostrils daily.  . [ ]  lisinopril (PRINIVIL,ZESTRIL) 20 MG tablet Take 1 tablet (20 mg total) by mouth daily.  . [  magnesium 30 MG tablet Take 60 mg by mouth as needed.   . [  naproxen (NAPROSYN) 500 MG tablet Take 1 tablet (500 mg total) by mouth 2 (two) times daily with a meal.  . [  predniSONE (DELTASONE) 20 MG tablet Take 1 pill daily for 1 week        Review of Systems     Objective:   Physical Exam    amb obese wf nad with freq throat clearing    Wt Readings from Last 3 Encounters:  09/14/17 245 lb 9.6 oz (111.4 kg)  08/10/17 240 lb 3.2 oz (109 kg)  06/24/17 236 lb 3.2 oz (107.1 kg)    Vital signs reviewed   - Note on arrival 02 sats  96% on RA    HEENT: nl dentition,and oropharynx. Nl external ear canals without cough reflex - moderate bilateral non-specific turbinate edema     NECK :  without JVD/Nodes/TM/ nl carotid upstrokes bilaterally   LUNGS: no acc muscle use,  Nl contour chest which is clear to A and P bilaterally without cough on insp or exp maneuvers   CV:  RRR  no s3 or murmur or increase in P2, and no edema   ABD:  soft and nontender with nl inspiratory excursion in the supine position. No bruits or organomegaly appreciated, bowel sounds nl  MS:  Nl gait/ ext warm  without deformities, calf tenderness, cyanosis or clubbing No obvious joint restrictions   SKIN: warm and dry without lesions    NEURO:  alert, approp, nl sensorium with  no motor or cerebellar deficits apparent.       I personally reviewed images and agree with radiology impression as follows:  CXR:   08/10/17 No edema or consolidation.   Assessment:

## 2017-09-15 ENCOUNTER — Encounter: Payer: Self-pay | Admitting: Internal Medicine

## 2017-09-15 NOTE — Assessment & Plan Note (Signed)
In the best review of chronic cough to date ( NEJM 2016 375 [phone]) ,  ACEi are now felt to cause cough in up to  20% of pts which is a 4 fold increase from previous reports and does not include the variety of non-specific complaints we see in pulmonary clinic in pts on ACEi but previously attributed to another dx like  Copd/asthma and  include PNDS, throat and chest congestion, "bronchitis", unexplained dyspnea and noct "strangling" sensations, and hoarseness, but also  atypical /refractory GERD symptoms like dysphagia and "bad heartburn"   The only way I know  to prove this is not an "ACEi Case" is a trial off ACEi x a minimum of 6 weeks then regroup.   rec try avapro 300 mg one half daily and return here ins 6 weeks if not better

## 2017-09-15 NOTE — Assessment & Plan Note (Signed)
Upper airway cough syndrome (previously labeled PNDS),  is so named because it's frequently impossible to sort out how much is  CR/sinusitis with freq throat clearing (which can be related to primary GERD)   vs  causing  secondary (" extra esophageal")  GERD from wide swings in gastric pressure that occur with throat clearing, often  promoting self use of mint and menthol lozenges that reduce the lower esophageal sphincter tone and exacerbate the problem further in a cyclical fashion.   These are the same pts (now being labeled as having "irritable larynx syndrome" by some cough centers) who not infrequently have a history of having failed to tolerate ace inhibitors,  dry powder inhalers or biphosphonates or report having atypical/extraesophageal reflux symptoms that don't respond to standard doses of PPI  and are easily confused as having aecopd or asthma flares by even experienced allergists/ pulmonologists (myself included).    rec off acei/ max gerd and use tessalon 200 to control the cough >>  f/u in 6 weeks prn    Total time devoted to counseling  > 50 % of initial 60 min office visit:  review case with pt/ discussion of options/alternatives/ personally creating written customized instructions  in presence of pt  then going over those specific  Instructions directly with the pt including how to use all of the meds but in particular covering each new medication in detail and the difference between the maintenance= "automatic" meds and the prns using an action plan format for the latter (If this problem/symptom => do that organization reading Left to right).  Please see AVS from this visit for a full list of these instructions which I personally wrote for this pt and  are unique to this visit.

## 2017-09-15 NOTE — Assessment & Plan Note (Signed)
Body mass index is 43.51 kg/m.  -  trending up  Lab Results  Component Value Date   TSH 1.105 11/21/2015     Contributing to gerd risk/ doe/reviewed the need and the process to achieve and maintain neg calorie balance > defer f/u primary care including intermittently monitoring thyroid status

## 2017-09-18 ENCOUNTER — Institutional Professional Consult (permissible substitution): Payer: BLUE CROSS/BLUE SHIELD | Admitting: Internal Medicine

## 2017-09-25 ENCOUNTER — Telehealth: Payer: Self-pay | Admitting: Internal Medicine

## 2017-09-25 MED ORDER — TELMISARTAN 80 MG PO TABS: 80.0000 mg | ORAL_TABLET | Freq: Every day | ORAL | 6 refills | Status: DC

## 2017-09-25 NOTE — Telephone Encounter (Signed)
Spoke with pt, advised recommendations. Sent Rx into pharmacy, nothing further is needed.

## 2017-09-25 NOTE — Telephone Encounter (Signed)
Pt is requesting alternative for Avapro, as there is currently a recall on this medication.  MW please advise. Thanks.   09/14/17 AVS  Patient Instructions    Stop lisinopril and start ibesartan  300 mg one half daily and your cough should gradually improve   Tessalon 200 mg up to 3 x daily for the cough as needed   Pantoprazole (protonix) 40 mg   Take  30-60 min before first meal of the day and Acid reduce 150  mg one @  bedtime until 100% better x 2 weeks then resume acid reducer like you are now = 150 mg daily   GERD (REFLUX)  is an extremely common cause of respiratory symptoms just like yours , many times with no obvious heartburn at all.    It can be treated with medication, but also with lifestyle changes including elevation of the head of your bed (ideally with 6 inch  bed blocks),  Smoking cessation, avoidance of late meals, excessive alcohol, and avoid fatty foods, chocolate, peppermint, colas, red wine, and acidic juices such as orange juice.  NO MINT OR MENTHOL PRODUCTS SO NO COUGH DROPS   USE SUGARLESS CANDY INSTEAD (Jolley ranchers or Stover's or Life Savers) or even ice chips will also do - the key is to swallow to prevent all throat clearing. NO OIL BASED VITAMINS - use powdered substitutes.     If you are satisfied with your treatment plan,  let your doctor know and he/she can either refill your medications or you can return here when your prescription runs out.     If in any way you are not 100% satisfied,  please tell us.  If 100% better, tell your friends!  Pulmonary follow up is as needed

## 2017-09-25 NOTE — Telephone Encounter (Signed)
Try micardis 80 mg daily but will need a check on bp w/in a week of change either by PCP or our NPs

## 2017-10-07 ENCOUNTER — Telehealth: Payer: Self-pay | Admitting: *Deleted

## 2017-10-07 NOTE — Telephone Encounter (Signed)
I called [facility] and was told generic nor brand is available.

## 2017-10-07 NOTE — Telephone Encounter (Signed)
Patient called stating Ann Walter birth control pills are on back order, no date to return. Patient will needs another Rx.  Please advise

## 2017-10-07 NOTE — Telephone Encounter (Signed)
That is a Technical brewergeneric Micronor, there is no other generic Micronor OC available? Please call the pharmacy to see if there is another substitute. She has hypertension unable to use a combination pills.

## 2017-10-08 NOTE — Telephone Encounter (Signed)
TC generic avail now and picked up yesterday and has restarted. FYI

## 2017-10-19 ENCOUNTER — Other Ambulatory Visit: Payer: Self-pay | Admitting: Family Medicine

## 2017-10-19 DIAGNOSIS — R053 Chronic cough: Secondary | ICD-10-CM

## 2017-10-19 DIAGNOSIS — R05 Cough: Secondary | ICD-10-CM

## 2017-10-19 NOTE — Telephone Encounter (Signed)
Pt is requesting refill on alprazolam 0.5mg.  

## 2017-11-20 ENCOUNTER — Other Ambulatory Visit: Payer: Self-pay | Admitting: Family Medicine

## 2017-11-26 ENCOUNTER — Other Ambulatory Visit: Payer: Self-pay | Admitting: Emergency Medicine

## 2017-11-26 MED ORDER — FLUOXETINE HCL 20 MG PO TABS: ORAL_TABLET | ORAL | 1 refills | Status: DC

## 2017-12-16 NOTE — Progress Notes (Signed)
[facility] [address], [address] [place], [place] [address] [phone]  Date:  12/17/2017   Name:  Ann Walter   DOB:  1965-12-08   MRN:  [id number]  PCP:  Pearline Cables, MD    Chief Complaint: Shoulder Pain (c/o left shoulder pain that happened from throwing boxes at work a few months ago. Pt recently had a fall about 3 weeks ago and shoulder pain is worse due to the fall. Also left foot pain as a result of the fall. ) and Medication Refill (Refill needed Alprazolam )   History of Present Illness:  Ann Walter is a 52 y.o. very pleasant female patient who presents with the following:  Here today with injury She fell when leaving a friend's house after dark. She fell onto her left shoulder- this was about 2 months ago.  The shoulder has continued to bother her since the time of her injury She also fell onto her left foot/ ankle,  She notes persistent tenderness of the medial left ankle only - otherwise her ankle and foot seem to be ok  She was not able to abduct her shoulder at first- this is now better, but she still cannot lie on the left shoulder, and it can be difficult to move her shoulder through its range of motion  She did not hit her head, no LOC  NCCSR: filled xanax 1/24.  No concerning entries  Patient Active Problem List   Diagnosis Date Noted  . Upper airway cough syndrome 09/14/2017  . Pre-diabetes 11/22/2015  . Morbid obesity due to excess calories (HCC) 11/21/2015  . Ovarian cyst 02/28/2015  . Chest pain, atypical 03/08/2014  . Heart murmur 03/08/2014  . Heart palpitations 03/08/2014  . GAD (generalized anxiety disorder) 02/13/2014  . Depression   . Essential hypertension     Past Medical History:  Diagnosis Date  . Allergy   . Benign hematuria 12/2009   WORKUP WITH DR. NESI WAS NEGATIVE  . Depression   . Diabetes mellitus without complication (HCC)    prediabetes per pt  . Heart murmur    . HTN (hypertension)   . Obesity   . UTI (urinary tract infection), uncomplicated     Past Surgical History:  Procedure Laterality Date  . CHOLECYSTECTOMY  10/2010  . CYSTECTOMY     x 2 from chest    Social History   Tobacco Use  . Smoking status: Never Smoker  . Smokeless tobacco: Never Used  Substance Use Topics  . Alcohol use: Yes    Alcohol/week: 0.0 oz    Comment: occasionally  . Drug use: No    Family History  Problem Relation Age of Onset  . Diabetes Mother   . Heart disease Mother        HAD TRIPLE BYPASS  . Hypertension Mother   . Kidney disease Father   . Breast cancer Sister   . Breast cancer Maternal Grandmother   . Cancer Maternal Grandmother        LYMPH NODE CANCER  . Cancer Maternal Aunt        COLORECTAL   . Colon cancer Neg Hx     Allergies  Allergen Reactions  . Wellbutrin [Bupropion]     Throat swelling   . Sulfonamide Derivatives     Unknown per pt    Medication list has been reviewed and updated.  Current Outpatient Medications on File Prior to  Visit  Medication Sig Dispense Refill  . FLUoxetine (PROZAC) 20 MG tablet Take 2 tablets by mouth daily. 180 tablet 1  . ibuprofen (ADVIL,MOTRIN) 600 MG tablet Take 600 mg by mouth every 6 (six) hours as needed for headache or moderate pain.    Marland Kitchen irbesartan (AVAPRO) 300 MG tablet Take 1 tablet (300 mg total) daily by mouth. 30 tablet 1  . metFORMIN (GLUCOPHAGE) 500 MG tablet Take 1 tablet (500 mg total) by mouth daily. 90 tablet 3  . norethindrone (MICRONOR,CAMILA,ERRIN) 0.35 MG tablet Take 1 tablet (0.35 mg total) by mouth daily. 3 Package 4  . pantoprazole (PROTONIX) 40 MG tablet Take 1 tablet (40 mg total) daily by mouth. Take 30-60 min before first meal of the day 30 tablet 2  . RaNITidine HCl (ACID REDUCER PO) Take 1 tablet by mouth daily.    Marland Kitchen telmisartan (MICARDIS) 80 MG tablet Take 1 tablet (80 mg total) by mouth daily. 30 tablet 6  . [DISCONTINUED] norethindrone-ethinyl estradiol  (JUNEL FE,GILDESS FE,LOESTRIN FE) 1-20 MG-MCG tablet Take 1 tablet by mouth daily. 1 Package 12   No current facility-administered medications on file prior to visit.     Review of Systems:  As per HPI- otherwise negative. No fever or chills   Physical Examination: Vitals:   12/17/17 1041  BP: 114/78  Pulse: 71  Temp: 98 F (36.7 C)  SpO2: 98%   Vitals:   12/17/17 1041  Weight: 264 lb (119.7 kg)   Body mass index is 46.77 kg/m. Ideal Body Weight:    GEN: WDWN, NAD, Non-toxic, A & O x 3, obese, otherwise looks well HEENT: Atraumatic, Normocephalic. Neck supple. No masses, No LAD. Ears and Nose: No external deformity. CV: RRR, No M/G/R. No JVD. No thrill. No extra heart sounds. PULM: CTA B, no wheezes, crackles, rhonchi. No retractions. No resp. distress. No accessory muscle use. ABD: S, NT, ND, +BS. No rebound. No HSM. EXTR: No c/c/e NEURO Normal gait.  PSYCH: Normally interactive. Conversant. Not depressed or anxious appearing.  Calm demeanor.  Left ankle- no swelling or bruise. No tenderness of the foot.  She has mild tenderness over the medial malleolus, otherwise negative Left shoulder-  She is quite tender over the [facility] joint- ? Shoulder separation.  Also notes pain with ROM but no weakness or deficit in ROM noted    Assessment and Plan: Sprain of left shoulder, unspecified shoulder sprain type, initial encounter - Plan: DG Shoulder Left, Ambulatory referral to Sports Medicine  Sprain of left ankle, unspecified ligament, initial encounter - Plan: DG Ankle Complete Left, Ambulatory referral to Sports Medicine  GAD (generalized anxiety disorder) - Plan: ALPRAZolam (XANAX) 0.5 MG tablet  Left ankle and left shoulder injuries in a fall about 2 months ago.  She continues to have significant sx in her left shoulder Will obtain plain films today, plan to refer to sports med Also refilled her xanax which is due today  Signed Abbe Amsterdam, MD  Dg Ankle Complete  Left  Result Date: 12/17/2017 CLINICAL DATA:  Fall on December 31st. Pins and needles feeling in lt mid medial foot. EXAM: LEFT ANKLE COMPLETE - 3+ VIEW COMPARISON:  None. FINDINGS: Osseous alignment is normal. Bone mineralization is normal. No fracture line or displaced fracture fragment. Ankle mortise is symmetric. No degenerative change at the left ankle. Visualized portions of the hindfoot and midfoot appear intact and normally aligned. Adjacent soft tissues are unremarkable. Incidental note made of chronic spurring at the dorsal and plantar  margins of the posterior calcaneus. Mild degenerative spurring noted within the midfoot. IMPRESSION: No acute findings. No fracture or dislocation. Additional chronic/degenerative changes detailed above. Electronically Signed   By: Bary RichardStan  Maynard M.D.   On: 12/17/2017 12:39   Dg Shoulder Left  Result Date: 12/17/2017 CLINICAL DATA:  Fall on December 31st.  Continued shoulder pain. EXAM: LEFT SHOULDER - 2+ VIEW COMPARISON:  Plain film of the left shoulder dated 07/13/2009. FINDINGS: There is no evidence of fracture or dislocation. There is no evidence of significant arthropathy or other focal bone abnormality. Mild degenerative spurring at the left [facility] joint. Soft tissues are unremarkable. IMPRESSION: No acute findings. No fracture or dislocation. Mild degenerative spurring at the left [facility] joint. Electronically Signed   By: Bary RichardStan  Maynard M.D.   On: 12/17/2017 12:37   Message to pt re: films

## 2017-12-17 ENCOUNTER — Encounter: Payer: Self-pay | Admitting: Family Medicine

## 2017-12-17 ENCOUNTER — Ambulatory Visit (INDEPENDENT_AMBULATORY_CARE_PROVIDER_SITE_OTHER): Payer: BLUE CROSS/BLUE SHIELD | Admitting: Family Medicine

## 2017-12-17 ENCOUNTER — Ambulatory Visit (HOSPITAL_BASED_OUTPATIENT_CLINIC_OR_DEPARTMENT_OTHER)
Admission: RE | Admit: 2017-12-17 | Discharge: 2017-12-17 | Disposition: A | Discharge status: Home / Self Care | Payer: BLUE CROSS/BLUE SHIELD | Source: Ambulatory Visit | Attending: Family Medicine | Admitting: Family Medicine

## 2017-12-17 VITALS — BP 114/78 | HR 71 | Temp 98.0°F | Wt 264.0 lb

## 2017-12-17 DIAGNOSIS — S43402A Unspecified sprain of left shoulder joint, initial encounter: Secondary | ICD-10-CM | POA: Diagnosis not present

## 2017-12-17 DIAGNOSIS — S93402A Sprain of unspecified ligament of left ankle, initial encounter: Secondary | ICD-10-CM | POA: Diagnosis not present

## 2017-12-17 DIAGNOSIS — F411 Generalized anxiety disorder: Secondary | ICD-10-CM

## 2017-12-17 DIAGNOSIS — X58XXXA Exposure to other specified factors, initial encounter: Secondary | ICD-10-CM | POA: Diagnosis not present

## 2017-12-17 DIAGNOSIS — M25572 Pain in left ankle and joints of left foot: Secondary | ICD-10-CM | POA: Diagnosis present

## 2017-12-17 MED ORDER — ALPRAZOLAM 0.5 MG PO TABS: 0.5000 mg | ORAL_TABLET | Freq: Three times a day (TID) | ORAL | 1 refills | Status: DC | PRN

## 2017-12-17 NOTE — Patient Instructions (Signed)
Please go to the ground floor for x-rays, then you can head home. I will be in touch with your x-ray reports and will refer you to see sports medicine as well for your shoulder and ankle. I refilled your xanax today as well

## 2017-12-22 ENCOUNTER — Ambulatory Visit (INDEPENDENT_AMBULATORY_CARE_PROVIDER_SITE_OTHER): Payer: BLUE CROSS/BLUE SHIELD | Admitting: Family Medicine

## 2017-12-22 ENCOUNTER — Encounter: Payer: Self-pay | Admitting: Family Medicine

## 2017-12-22 DIAGNOSIS — G8929 Other chronic pain: Secondary | ICD-10-CM

## 2017-12-22 DIAGNOSIS — M25512 Pain in left shoulder: Secondary | ICD-10-CM

## 2017-12-22 MED ORDER — NITROGLYCERIN 0.2 MG/HR TD PT24: MEDICATED_PATCH | TRANSDERMAL | 1 refills | Status: DC

## 2017-12-22 MED ORDER — MELOXICAM 15 MG PO TABS: 15.0000 mg | ORAL_TABLET | Freq: Every day | ORAL | 1 refills | Status: DC

## 2017-12-22 NOTE — Patient Instructions (Signed)
You have strained your rotator cuff and have impingement Try to avoid painful activities (overhead activities, lifting with extended arm) as much as possible. Meloxicam 15mg  daily with food for pain and inflammation. Can take tylenol in addition to this. I would not recommend a cortisone injection given this was from an injury as we discussed. Consider physical therapy with transition to home exercise program. Do home exercise program with theraband and scapular stabilization exercises daily 3 sets of 10 once a day. Nitro patches 1/4th patch over affected shoulder, change daily. If not improving at follow-up we will consider further imaging, physical therapy. Follow up with me in 6 weeks.

## 2017-12-22 NOTE — Progress Notes (Signed)
PCP and consultation requested by: Copland, Gwenlyn FoundJessica C, MD  Subjective:   HPI: Patient is a 52 y.o. female here for left shoulder injury.  Patient reports on 12/31 she accidentally fell forward, stuck arms out to catch herself and injured left shoulder. She recalls having some pain prior in this shoulder with throwing boxes at work. After this injury however she couldn't lift her shoulder up and backwards. Feels a pins-like pain lateral left shoulder. Pain level is 5/10 now. Tried heating pad but not taking any medicines regularly. No skin changes, numbness.  Past Medical History:  Diagnosis Date  . Allergy   . Benign hematuria 12/2009   WORKUP WITH DR. NESI WAS NEGATIVE  . Depression   . Diabetes mellitus without complication (HCC)    prediabetes per pt  . Heart murmur   . HTN (hypertension)   . Obesity   . UTI (urinary tract infection), uncomplicated     Current Outpatient Medications on File Prior to Visit  Medication Sig Dispense Refill  . ALPRAZolam (XANAX) 0.5 MG tablet Take 1 tablet (0.5 mg total) by mouth 3 (three) times daily as needed. 90 tablet 1  . FLUoxetine (PROZAC) 20 MG tablet Take 2 tablets by mouth daily. 180 tablet 1  . ibuprofen (ADVIL,MOTRIN) 600 MG tablet Take 600 mg by mouth every 6 (six) hours as needed for headache or moderate pain.    Marland Kitchen. irbesartan (AVAPRO) 300 MG tablet Take 1 tablet (300 mg total) daily by mouth. 30 tablet 1  . metFORMIN (GLUCOPHAGE) 500 MG tablet Take 1 tablet (500 mg total) by mouth daily. 90 tablet 3  . norethindrone (MICRONOR,CAMILA,ERRIN) 0.35 MG tablet Take 1 tablet (0.35 mg total) by mouth daily. 3 Package 4  . pantoprazole (PROTONIX) 40 MG tablet Take 1 tablet (40 mg total) daily by mouth. Take 30-60 min before first meal of the day 30 tablet 2  . RaNITidine HCl (ACID REDUCER PO) Take 1 tablet by mouth daily.    Marland Kitchen. telmisartan (MICARDIS) 80 MG tablet Take 1 tablet (80 mg total) by mouth daily. 30 tablet 6  . [DISCONTINUED]  norethindrone-ethinyl estradiol (JUNEL FE,GILDESS FE,LOESTRIN FE) 1-20 MG-MCG tablet Take 1 tablet by mouth daily. 1 Package 12   No current facility-administered medications on file prior to visit.     Past Surgical History:  Procedure Laterality Date  . CHOLECYSTECTOMY  10/2010  . CYSTECTOMY     x 2 from chest    Allergies  Allergen Reactions  . Wellbutrin [Bupropion]     Throat swelling   . Sulfonamide Derivatives     Unknown per pt    Social History   Socioeconomic History  . Marital status: Married    Spouse name: Not on file  . Number of children: 0  . Years of education: Not on file  . Highest education level: Not on file  Social Needs  . Financial resource strain: Not on file  . Food insecurity - worry: Not on file  . Food insecurity - inability: Not on file  . Transportation needs - medical: Not on file  . Transportation needs - non-medical: Not on file  Occupational History  . Occupation: Advertising copywriterMortage insurance    Employer: [employer]  . Occupation: HAZARD/FLOOD    Employer: [employer]  Tobacco Use  . Smoking status: Never Smoker  . Smokeless tobacco: Never Used  Substance and Sexual Activity  . Alcohol use: Yes    Alcohol/week: 0.0 oz    Comment: occasionally  .  Drug use: No  . Sexual activity: Yes    Partners: Male    Birth control/protection: Pill    Comment: INTERCOURSE AGE 37, SEXUAL PARTNERS MORE THAN 5  Other Topics Concern  . Not on file  Social History Narrative   Married with no children   She worked with [employer] for 15 years    Family History  Problem Relation Age of Onset  . Diabetes Mother   . Heart disease Mother        HAD TRIPLE BYPASS  . Hypertension Mother   . Kidney disease Father   . Breast cancer Sister   . Breast cancer Maternal Grandmother   . Cancer Maternal Grandmother        LYMPH NODE CANCER  . Cancer Maternal Aunt        COLORECTAL   . Colon cancer Neg Hx     BP 126/87   Pulse 69   Ht 5\' 4"   (1.626 m)   Wt 260 lb (117.9 kg)   BMI 44.63 kg/m   Review of Systems: See HPI above.     Objective:  Physical Exam:  Gen: NAD, comfortable in exam room  Left shoulder: No swelling, ecchymoses.  No gross deformity. No TTP currently including AC joint, biceps tendon. FROM with painful arc. Positive Hawkins, Neers. Negative Yergasons. Strength 5/5 with empty can and resisted internal/external rotation.  Pain empty can > ER. Negative apprehension. NV intact distally.  Right shoulder: No swelling, ecchymoses.  No gross deformity. No TTP. FROM. Strength 5/5 with empty can and resisted internal/external rotation. NV intact distally.   Assessment & Plan:  1. Left shoulder pain - injured with a fall.  History and exam suggest rotator cuff strain with impingement.  She would like to do home exercises, try meloxicam and nitro patches.  F/u in 6 weeks.  Consider further imaging, physical therapy.

## 2017-12-22 NOTE — Assessment & Plan Note (Signed)
injured with a fall.  History and exam suggest rotator cuff strain with impingement.  She would like to do home exercises, try meloxicam and nitro patches.  F/u in 6 weeks.  Consider further imaging, physical therapy.

## 2017-12-23 ENCOUNTER — Encounter: Payer: Self-pay | Admitting: Family Medicine

## 2017-12-23 ENCOUNTER — Ambulatory Visit: Payer: BLUE CROSS/BLUE SHIELD | Admitting: Family Medicine

## 2017-12-24 ENCOUNTER — Ambulatory Visit: Payer: Self-pay | Admitting: Family Medicine

## 2018-02-01 ENCOUNTER — Encounter: Payer: Self-pay | Admitting: Family Medicine

## 2018-02-01 ENCOUNTER — Ambulatory Visit (INDEPENDENT_AMBULATORY_CARE_PROVIDER_SITE_OTHER): Payer: BLUE CROSS/BLUE SHIELD | Admitting: Family Medicine

## 2018-02-01 DIAGNOSIS — M25512 Pain in left shoulder: Secondary | ICD-10-CM | POA: Diagnosis not present

## 2018-02-01 DIAGNOSIS — G8929 Other chronic pain: Secondary | ICD-10-CM

## 2018-02-01 DIAGNOSIS — M79671 Pain in right foot: Secondary | ICD-10-CM

## 2018-02-01 NOTE — Patient Instructions (Signed)
You're doing better but still have rotator cuff impingement. Try to avoid painful activities (overhead activities, lifting with extended arm) as much as possible. Meloxicam 15mg  daily with food for pain and inflammation as needed. Can take tylenol in addition to this. Consider physical therapy with transition to home exercise program. Do home exercise program with theraband and scapular stabilization exercises daily 3 sets of 10 once a day. Follow up with me in 6 weeks or as needed if you're doing well.  For your feet try a wart-removing medication - discs that have salicylic acid in them. Tinactin for athletes foot - follow directions on the back, let me know if you want me to send in something but this should work. Keep feet dry - dry powder, synthetic non-cotton socks help with this. Consider seeing a podiatrist if still not improving.

## 2018-02-04 ENCOUNTER — Encounter: Payer: Self-pay | Admitting: Family Medicine

## 2018-02-04 DIAGNOSIS — M79671 Pain in right foot: Secondary | ICD-10-CM | POA: Insufficient documentation

## 2018-02-04 NOTE — Progress Notes (Signed)
PCP and consultation requested by: Copland, Gwenlyn Found, MD  Subjective:   HPI: Patient is a 52 y.o. female here for left shoulder injury.  2/26: Patient reports on 12/31 she accidentally fell forward, stuck arms out to catch herself and injured left shoulder. She recalls having some pain prior in this shoulder with throwing boxes at work. After this injury however she couldn't lift her shoulder up and backwards. Feels a pins-like pain lateral left shoulder. Pain level is 5/10 now. Tried heating pad but not taking any medicines regularly. No skin changes, numbness.  4/8: Patient reports that her left shoulder has improved compared to last visit. Doing home exercises some. She is taking meloxicam and tolerating this well. She stopped nitro patches as it caused her to have a headache. Pain level up to 2 out of 10 soreness gets higher with more use. She did note a white dot on the bottom of her right foot that is painful. No other skin changes, numbness.  Past Medical History:  Diagnosis Date  . Allergy   . Benign hematuria 12/2009   WORKUP WITH DR. NESI WAS NEGATIVE  . Depression   . Diabetes mellitus without complication (HCC)    prediabetes per pt  . Heart murmur   . HTN (hypertension)   . Obesity   . UTI (urinary tract infection), uncomplicated     Current Outpatient Medications on File Prior to Visit  Medication Sig Dispense Refill  . ALPRAZolam (XANAX) 0.5 MG tablet Take 1 tablet (0.5 mg total) by mouth 3 (three) times daily as needed. 90 tablet 1  . FLUoxetine (PROZAC) 20 MG tablet Take 2 tablets by mouth daily. 180 tablet 1  . ibuprofen (ADVIL,MOTRIN) 600 MG tablet Take 600 mg by mouth every 6 (six) hours as needed for headache or moderate pain.    Marland Kitchen irbesartan (AVAPRO) 300 MG tablet Take 1 tablet (300 mg total) daily by mouth. 30 tablet 1  . meloxicam (MOBIC) 15 MG tablet Take 1 tablet (15 mg total) by mouth daily. 30 tablet 1  . metFORMIN (GLUCOPHAGE) 500 MG tablet  Take 1 tablet (500 mg total) by mouth daily. 90 tablet 3  . nitroGLYCERIN (NITRODUR - DOSED IN MG/24 HR) 0.2 mg/hr patch Apply 1/4th patch to affected shoulder, change daily 30 patch 1  . norethindrone (MICRONOR,CAMILA,ERRIN) 0.35 MG tablet Take 1 tablet (0.35 mg total) by mouth daily. 3 Package 4  . pantoprazole (PROTONIX) 40 MG tablet Take 1 tablet (40 mg total) daily by mouth. Take 30-60 min before first meal of the day 30 tablet 2  . RaNITidine HCl (ACID REDUCER PO) Take 1 tablet by mouth daily.    Marland Kitchen telmisartan (MICARDIS) 80 MG tablet Take 1 tablet (80 mg total) by mouth daily. 30 tablet 6  . [DISCONTINUED] norethindrone-ethinyl estradiol (JUNEL FE,GILDESS FE,LOESTRIN FE) 1-20 MG-MCG tablet Take 1 tablet by mouth daily. 1 Package 12   No current facility-administered medications on file prior to visit.     Past Surgical History:  Procedure Laterality Date  . CHOLECYSTECTOMY  10/2010  . CYSTECTOMY     x 2 from chest    Allergies  Allergen Reactions  . Wellbutrin [Bupropion]     Throat swelling   . Sulfonamide Derivatives     Unknown per pt    Social History   Socioeconomic History  . Marital status: Married    Spouse name: Not on file  . Number of children: 0  . Years of education: Not on file  .  Highest education level: Not on file  Occupational History  . Occupation: Advertising copywriterMortage insurance    Employer: [employer]  . Occupation: HAZARD/FLOOD    Employer: [employer]  Social Needs  . Financial resource strain: Not on file  . Food insecurity:    Worry: Not on file    Inability: Not on file  . Transportation needs:    Medical: Not on file    Non-medical: Not on file  Tobacco Use  . Smoking status: Never Smoker  . Smokeless tobacco: Never Used  Substance and Sexual Activity  . Alcohol use: Yes    Alcohol/week: 0.0 oz    Comment: occasionally  . Drug use: No  . Sexual activity: Yes    Partners: Male    Birth control/protection: Pill    Comment:  INTERCOURSE AGE 48, SEXUAL PARTNERS MORE THAN 5  Lifestyle  . Physical activity:    Days per week: Not on file    Minutes per session: Not on file  . Stress: Not on file  Relationships  . Social connections:    Talks on phone: Not on file    Gets together: Not on file    Attends religious service: Not on file    Active member of club or organization: Not on file    Attends meetings of clubs or organizations: Not on file    Relationship status: Not on file  . Intimate partner violence:    Fear of current or ex partner: Not on file    Emotionally abused: Not on file    Physically abused: Not on file    Forced sexual activity: Not on file  Other Topics Concern  . Not on file  Social History Narrative   Married with no children   She worked with [employer] for 15 years    Family History  Problem Relation Age of Onset  . Diabetes Mother   . Heart disease Mother        HAD TRIPLE BYPASS  . Hypertension Mother   . Kidney disease Father   . Breast cancer Sister   . Breast cancer Maternal Grandmother   . Cancer Maternal Grandmother        LYMPH NODE CANCER  . Cancer Maternal Aunt        COLORECTAL   . Colon cancer Neg Hx     BP (!) 168/81   Pulse 86   Ht 5\' 4"  (1.626 m)   Wt 260 lb (117.9 kg)   BMI 44.63 kg/m   Review of Systems: See HPI above.     Objective:  Physical Exam:  Gen: NAD, comfortable in exam room  Left shoulder: No swelling, ecchymoses.  No gross deformity. No TTP. FROM with mild impingement. Positive Hawkins, negative Neers. Negative Yergasons. Strength 5/5 with empty can and resisted internal/external rotation.  Pain mild with empty can. Negative apprehension. NV intact distally.  Right foot: Athletes foot.  Plantar wart with mild overlying callus just proximal to head of 5th metatarsal.  No other gross deformity, swelling, ecchymoses FROM without pain.  5/5 strength all directions. TTP of wart noted above. NV intact distally.    Assessment & Plan:  1. Left shoulder pain -improving from her rotator cuff strain with impingement.  She will continue with home exercises, meloxicam only if needed now.  Stop nitro as these cause headaches.  She will consider physical therapy and follow-up with us as needed if she is doing well.  2. Right  foot pain - noted to have athlete's foot and a plantar wart.  Discussed medication for both conditions, keeping feet dry, non-cotton socks.  Consider seeing podiatry if not improving.

## 2018-02-04 NOTE — Assessment & Plan Note (Signed)
improving from her rotator cuff strain with impingement.  She will continue with home exercises, meloxicam only if needed now.  Stop nitro as these cause headaches.  She will consider physical therapy and follow-up with us as needed if she is doing well.

## 2018-02-04 NOTE — Assessment & Plan Note (Signed)
noted to have athlete's foot and a plantar wart.  Discussed medication for both conditions, keeping feet dry, non-cotton socks.  Consider seeing podiatry if not improving.

## 2018-02-14 ENCOUNTER — Other Ambulatory Visit: Payer: Self-pay | Admitting: Family Medicine

## 2018-02-14 DIAGNOSIS — F411 Generalized anxiety disorder: Secondary | ICD-10-CM

## 2018-02-15 NOTE — Telephone Encounter (Signed)
NCCSR:  01/16/2018  1  12/17/2017  Alprazolam 0.5 Mg Tablet  90 30 Je Cop  [id number]  Wal (2001)  1 3.00 LME Comm Ins  [place]  12/17/2017  1  12/17/2017  Alprazolam 0.5 Mg Tablet  90 30 Je Cop  [id number]  Wal (2001)  0 3.00 LME Comm Ins  [place]  11/19/2017  1  10/19/2017  Alprazolam 0.5 Mg Tablet  90 30 Je Cop  [id number]  Wal (2001)  1 3.00 LME Comm Ins  [place]  10/19/2017  1  10/19/2017  Alprazolam 0.5 Mg Tablet  90 30 Je Cop  [id number]  Wal (2001)  0 3.00 LME Comm Ins  [place]  09/21/2017  1  08/10/2017  Alprazolam 0.5 Mg Tablet  90 30 Je Cop  [id number]  Wal (2001)  1 3.00 LME Comm Ins  [place]  08/20/2017  1  08/10/2017  Alprazolam 0.5 Mg Tablet  90 30 Je Cop  [id number]  Wal (2001)        Ok to refill, will do for her today

## 2018-02-15 NOTE — Telephone Encounter (Signed)
Received refill request for ALPRAZOLAM 0.5MG  TAB. Last office visit and refill 12/17/17.

## 2018-02-18 ENCOUNTER — Encounter: Payer: Self-pay | Admitting: Family Medicine

## 2018-02-20 NOTE — Progress Notes (Addendum)
[facility] [address], [address] [place], [place] [address] [phone]  Date:  02/22/2018   Name:  Ann Walter   DOB:  Oct 10, 1966   MRN:  [id number]  PCP:  Pearline Cables, MD    Chief Complaint: No chief complaint on file.   History of Present Illness:  Ann Walter is a 52 y.o. very pleasant female patient who presents with the following:  Here today with concern of itching History of anxiety, pre-diabetes, HTN She is not sure if her itching maybe due to nerves, something biting her, etc Her sx have been going on for a couple of weeks, at least. Her itching is really all over her body She has not noted any particular marks except for a few ?bites on her feet- however we are not really sure if there are bites or just where she scratched herself  No one else at home has sx so we do not think fleas, etc No fever noted  She has not tried any OTC meds due to not having money to afford it  She stopped taking her metformin- she estimates that she stopped taking this 3-4 months ago Pt notes that she has felt more down and like she just does not care- although she denies any SI- for the last 4 years since her mom died.  She does take fluoxetine and is still taking this.  40 mg. She would be willing to  She states that she has felt really down for some years but just never mentioned it to me She uses her xanax for sleep and this does help, she is sleeping ok  Lab Results  Component Value Date   HGBA1C 5.9 04/27/2017   No rash noted on her skin No oral lesion No changes in her home life really but she tends to have a lot of stress and general dysthymia  Patient Active Problem List   Diagnosis Date Noted  . Right foot pain 02/04/2018  . Left shoulder pain 12/22/2017  . Upper airway cough syndrome 09/14/2017  . Pre-diabetes 11/22/2015  . Morbid obesity due to excess calories (HCC) 11/21/2015  . Ovarian cyst  02/28/2015  . Chest pain, atypical 03/08/2014  . Heart murmur 03/08/2014  . Heart palpitations 03/08/2014  . GAD (generalized anxiety disorder) 02/13/2014  . Depression   . Essential hypertension     Past Medical History:  Diagnosis Date  . Allergy   . Benign hematuria 12/2009   WORKUP WITH DR. NESI WAS NEGATIVE  . Depression   . Diabetes mellitus without complication (HCC)    prediabetes per pt  . Heart murmur   . HTN (hypertension)   . Obesity   . UTI (urinary tract infection), uncomplicated     Past Surgical History:  Procedure Laterality Date  . CHOLECYSTECTOMY  10/2010  . CYSTECTOMY     x 2 from chest    Social History   Tobacco Use  . Smoking status: Never Smoker  . Smokeless tobacco: Never Used  Substance Use Topics  . Alcohol use: Yes    Alcohol/week: 0.0 oz    Comment: occasionally  . Drug use: No    Family History  Problem Relation Age of Onset  . Diabetes Mother   . Heart disease Mother        HAD TRIPLE BYPASS  . Hypertension Mother   . Kidney disease Father   . Breast cancer Sister   .  Breast cancer Maternal Grandmother   . Cancer Maternal Grandmother        LYMPH NODE CANCER  . Cancer Maternal Aunt        COLORECTAL   . Colon cancer Neg Hx     Allergies  Allergen Reactions  . Wellbutrin [Bupropion]     Throat swelling   . Sulfonamide Derivatives     Unknown per pt    Medication list has been reviewed and updated.  Current Outpatient Medications on File Prior to Visit  Medication Sig Dispense Refill  . ALPRAZolam (XANAX) 0.5 MG tablet TAKE 1 TABLET BY MOUTH THREE TIMES DAILY AS NEEDED 90 tablet 2  . FLUoxetine (PROZAC) 20 MG tablet Take 2 tablets by mouth daily. 180 tablet 1  . meloxicam (MOBIC) 15 MG tablet Take 1 tablet (15 mg total) by mouth daily. 30 tablet 1  . nitroGLYCERIN (NITRODUR - DOSED IN MG/24 HR) 0.2 mg/hr patch Apply 1/4th patch to affected shoulder, change daily 30 patch 1  . telmisartan (MICARDIS) 80 MG tablet  Take 1 tablet (80 mg total) by mouth daily. 30 tablet 6  . ibuprofen (ADVIL,MOTRIN) 600 MG tablet Take 600 mg by mouth every 6 (six) hours as needed for headache or moderate pain.    Marland Kitchen irbesartan (AVAPRO) 300 MG tablet Take 1 tablet (300 mg total) daily by mouth. (Patient not taking: Reported on 02/22/2018) 30 tablet 1  . metFORMIN (GLUCOPHAGE) 500 MG tablet Take 1 tablet (500 mg total) by mouth daily. (Patient not taking: Reported on 02/22/2018) 90 tablet 3  . norethindrone (MICRONOR,CAMILA,ERRIN) 0.35 MG tablet Take 1 tablet (0.35 mg total) by mouth daily. (Patient not taking: Reported on 02/22/2018) 3 Package 4  . pantoprazole (PROTONIX) 40 MG tablet Take 1 tablet (40 mg total) daily by mouth. Take 30-60 min before first meal of the day (Patient not taking: Reported on 02/22/2018) 30 tablet 2  . RaNITidine HCl (ACID REDUCER PO) Take 1 tablet by mouth daily.    . [DISCONTINUED] norethindrone-ethinyl estradiol (JUNEL FE,GILDESS FE,LOESTRIN FE) 1-20 MG-MCG tablet Take 1 tablet by mouth daily. 1 Package 12   No current facility-administered medications on file prior to visit.     Review of Systems:  As per HPI- otherwise negative.   Physical Examination: Vitals:   02/22/18 1313  BP: 128/82  Pulse: 73  Resp: 16  Temp: (!) 97.5 F (36.4 C)  SpO2: 98%   Vitals:   02/22/18 1313  Weight: 269 lb (122 kg)  Height: 5' 3.5" (1.613 m)   Body mass index is 46.9 kg/m. Ideal Body Weight: Weight in (lb) to have BMI = 25: 143.1  GEN: WDWN, NAD, Non-toxic, A & O x 3, obese, otherwise looks well  HEENT: Atraumatic, Normocephalic. Neck supple. No masses, No LAD.  Bilateral TM wnl, oropharynx normal.  PEERL,EOMI.   Ears and Nose: No external deformity. CV: RRR, No M/G/R. No JVD. No thrill. No extra heart sounds. PULM: CTA B, no wheezes, crackles, rhonchi. No retractions. No resp. distress. No accessory muscle use. ABD: S, NT, ND EXTR: No c/c/e NEURO Normal gait.  PSYCH: Normally interactive.  Conversant. Not depressed or anxious appearing.  Calm demeanor.  She has a couple of excoriated spots on the dorsum of the left foot and the right lateral ankle- however I don't think these represent bites at all Otherwise no rash noted    Assessment and Plan: Itching - Plan: CBC, Comprehensive metabolic panel, Ferritin, predniSONE (DELTASONE) 20 MG tablet  GAD (generalized  anxiety disorder) - Plan: FLUoxetine 60 MG TABS  Essential hypertension  Pre-diabetes - Plan: Hemoglobin A1c  Mild episode of recurrent major depressive disorder (HCC) - Plan: FLUoxetine 60 MG TABS  Here today with itching- uncertain etiology Will obtain labs as above and use a short pred burst for her She has pre-diabetes and is not taking her metformin- last A1c looked fine, will repeat for her today.  Don't anticipate any serious issues with prednisone as her glucose has never been dangerously high  Increase her prozac to 60 mg a day for persistent dysthymia   Meds ordered this encounter  Medications  . predniSONE (DELTASONE) 20 MG tablet    Sig: Take 2 pills a day for 5 days, then 1 pill a day for 5 days    Dispense:  15 tablet    Refill:  0  . FLUoxetine 60 MG TABS    Sig: Take 1 tablet by mouth daily.    Dispense:  90 tablet    Refill:  3    Please consider 90 day supplies to promote better adherence   Will plan further follow- up pending labs.   Signed Abbe Amsterdam, MD  Received her labs , message to pt Blood counts are normal and iron is normal- no anemia Hemoglobin A1c is in the pre-diabetes range.  I would recommend that you go back on metformin if you are willing to do so- I will refill it for you Metabolic profile is normal  Please come and see me in the office in about one month Results for orders placed or performed in visit on 02/22/18  CBC  Result Value Ref Range   WBC 5.7 4.0 - 10.5 K/uL   RBC 4.27 3.87 - 5.11 Mil/uL   Platelets 283.0 150.0 - 400.0 K/uL   Hemoglobin 12.8 12.0  - 15.0 g/dL   HCT 40.9 81.1 - 91.4 %   MCV 89.1 78.0 - 100.0 fl   MCHC 33.6 30.0 - 36.0 g/dL   RDW 78.2 95.6 - 21.3 %  Hemoglobin A1c  Result Value Ref Range   Hgb A1c MFr Bld 6.2 4.6 - 6.5 %  Comprehensive metabolic panel  Result Value Ref Range   Sodium 138 135 - 145 mEq/L   Potassium 3.9 3.5 - 5.1 mEq/L   Chloride 105 96 - 112 mEq/L   CO2 27 19 - 32 mEq/L   Glucose, Bld 97 70 - 99 mg/dL   BUN 14 6 - 23 mg/dL   Creatinine, Ser 0.86 0.40 - 1.20 mg/dL   Total Bilirubin 0.4 0.2 - 1.2 mg/dL   Alkaline Phosphatase 81 39 - 117 U/L   AST 17 0 - 37 U/L   ALT 13 0 - 35 U/L   Total Protein 6.8 6.0 - 8.3 g/dL   Albumin 4.0 3.5 - 5.2 g/dL   Calcium 9.0 8.4 - 57.8 mg/dL   GFR 46.96 >29.52 mL/min  Ferritin  Result Value Ref Range   Ferritin 49.4 10.0 - 291.0 ng/mL

## 2018-02-22 ENCOUNTER — Encounter: Payer: Self-pay | Admitting: Family Medicine

## 2018-02-22 ENCOUNTER — Ambulatory Visit (INDEPENDENT_AMBULATORY_CARE_PROVIDER_SITE_OTHER): Payer: BLUE CROSS/BLUE SHIELD | Admitting: Family Medicine

## 2018-02-22 VITALS — BP 128/82 | HR 73 | Temp 97.5°F | Resp 16 | Ht 63.5 in | Wt 269.0 lb

## 2018-02-22 DIAGNOSIS — F411 Generalized anxiety disorder: Secondary | ICD-10-CM | POA: Diagnosis not present

## 2018-02-22 DIAGNOSIS — I10 Essential (primary) hypertension: Secondary | ICD-10-CM

## 2018-02-22 DIAGNOSIS — R7303 Prediabetes: Secondary | ICD-10-CM | POA: Diagnosis not present

## 2018-02-22 DIAGNOSIS — F330 Major depressive disorder, recurrent, mild: Secondary | ICD-10-CM | POA: Diagnosis not present

## 2018-02-22 DIAGNOSIS — L299 Pruritus, unspecified: Secondary | ICD-10-CM | POA: Diagnosis not present

## 2018-02-22 LAB — COMPREHENSIVE METABOLIC PANEL
ALT: 13 U/L (ref 0–35)
AST: 17 U/L (ref 0–37)
Albumin: 4 g/dL (ref 3.5–5.2)
Alkaline Phosphatase: 81 U/L (ref 39–117)
BUN: 14 mg/dL (ref 6–23)
CALCIUM: 9 mg/dL (ref 8.4–10.5)
CO2: 27 meq/L (ref 19–32)
CREATININE: 0.81 mg/dL (ref 0.40–1.20)
Chloride: 105 mEq/L (ref 96–112)
GFR: 78.81 mL/min (ref >60.00)
Glucose, Bld: 97 mg/dL (ref 70–99)
Potassium: 3.9 mEq/L (ref 3.5–5.1)
Sodium: 138 mEq/L (ref 135–145)
Total Bilirubin: 0.4 mg/dL (ref 0.2–1.2)
Total Protein: 6.8 g/dL (ref 6.0–8.3)

## 2018-02-22 LAB — HEMOGLOBIN A1C: Hgb A1c MFr Bld: 6.2 % (ref 4.6–6.5)

## 2018-02-22 LAB — CBC
HCT: 38 % (ref 36.0–46.0)
HEMOGLOBIN: 12.8 g/dL (ref 12.0–15.0)
MCHC: 33.6 g/dL (ref 30.0–36.0)
MCV: 89.1 fl (ref 78.0–100.0)
Platelets: 283 10*3/uL (ref 150.0–400.0)
RBC: 4.27 Mil/uL (ref 3.87–5.11)
RDW: 13.7 % (ref 11.5–15.5)
WBC: 5.7 10*3/uL (ref 4.0–10.5)

## 2018-02-22 LAB — FERRITIN: Ferritin: 49.4 ng/mL (ref 10.0–291.0)

## 2018-02-22 MED ORDER — FLUOXETINE HCL 60 MG PO TABS: ORAL_TABLET | ORAL | 3 refills | Status: DC

## 2018-02-22 MED ORDER — METFORMIN HCL 500 MG PO TABS: 500.0000 mg | ORAL_TABLET | Freq: Every day | ORAL | 3 refills | Status: DC

## 2018-02-22 MED ORDER — PREDNISONE 20 MG PO TABS: ORAL_TABLET | ORAL | 0 refills | Status: DC

## 2018-02-22 NOTE — Patient Instructions (Addendum)
We will get some labs for you today and look for any cause of your itching I will also check on your A1c to see how you are getting along without your metfomrin For your current itching, we are going to try prednisone for 10 days. I think this should really help Also, I am going to increase your fluoxetine to 60 mg a day to help with depression symptoms   Please follow-up with me in 3-4 weeks so we can check on how you are going- sooner if you are not doing ok If you have any concern that you may harm yourself please seek immediate care!

## 2018-02-22 NOTE — Addendum Note (Signed)
Addended by: Abbe Amsterdam C on: 02/22/2018 07:01 PM   Modules accepted: Orders

## 2018-03-02 ENCOUNTER — Telehealth: Payer: Self-pay | Admitting: Family Medicine

## 2018-03-02 DIAGNOSIS — F411 Generalized anxiety disorder: Secondary | ICD-10-CM

## 2018-03-02 DIAGNOSIS — F330 Major depressive disorder, recurrent, mild: Secondary | ICD-10-CM

## 2018-03-02 NOTE — Telephone Encounter (Signed)
Copied from CRM [id number]. Topic: Quick Communication - See Telephone Encounter >> Mar 02, 2018 12:37 PM Waymon Amato wrote: Pt is needing to let Dr. Patsy Lager know that the fluoxetine  is on back order for the phamacy and when she tried to get it at a different pharmacy it cost over 600 and she does not have that so she would like to go back to the  and let [place] modify the dosage to make it 60 mg if that is what she needs to be on    Best number [phone]  [facility] [place] [phone]

## 2018-03-03 ENCOUNTER — Encounter: Payer: Self-pay | Admitting: Family Medicine

## 2018-03-03 MED ORDER — FLUOXETINE HCL 20 MG PO TABS: 60.0000 mg | ORAL_TABLET | Freq: Every day | ORAL | 11 refills | Status: DC

## 2018-03-24 NOTE — Progress Notes (Signed)
[facility] [address], [address] [place], [place] [address] [phone]  Date:  03/25/2018   Name:  Ann Walter   DOB:  Nov 02, 1965   MRN:  [id number]  PCP:  Pearline Cables, MD    Chief Complaint: Abdominal Pain (lower abdominal pain); Stress ("doesnt care anymore" , did not take xanax today, done with life); and Rash (left arm, rash, itching)   History of Present Illness:  Ann Walter is a 52 y.o. very pleasant female patient who presents with the following:  Seen here on 4/29 with the following: Here today with concern of itching History of anxiety, pre-diabetes, HTN She is not sure if her itching maybe due to nerves, something biting her, etc Her sx have been going on for a couple of weeks, at least. Her itching is really all over her body She has not noted any particular marks except for a few ?bites on her feet- however we are not really sure if there are bites or just where she scratched herself  No one else at home has sx so we do not think fleas, etc No fever noted  She has not tried any OTC meds due to not having money to afford it She stopped taking her metformin- she estimates that she stopped taking this 3-4 months ago Pt notes that she has felt more down and like she just does not care- although she denies any SI- for the last 4 years since her mom died.  She does take fluoxetine and is still taking this.  40 mg. She would be willing to  She states that she has felt really down for some years but just never mentioned it to me She uses her xanax for sleep and this does help, she is sleeping ok  No rash noted on her skin No oral lesion No changes in her home life really but she tends to have a lot of stress and general dysthymia Assessment/plan: Here today with itching- uncertain etiology Will obtain labs as above and use a short pred burst for her She has pre-diabetes and is not taking her metformin-  last A1c looked fine, will repeat for her today.  Don't anticipate any serious issues with prednisone as her glucose has never been dangerously high  Increase her prozac to 60 mg a day for persistent dysthymia   We tried increasing her prozac to 60 mg and started back on metformin at her last visit  Lab Results  Component Value Date   HGBA1C 6.2 02/22/2018   Front desk staff came back to get [place] as pt began sobbing in waiting room today.  She was brought back to room She notes abd pain for the last 2 days, worse today It seems to hurt in her right upper abdomen  This reminds her of when she had her gallbladder taken out in 2012.  However she had this pain intermittently in the years since her chole so she does not think that her gallbladder was really to blmae She has not really been able to eat the couple of days No vomiting but she has "spit up" some No fever  She wonders if her pain is due "to stress,"  Asked her about stressors; "everything, work is the worst, money, home. All of the above." She notes that work is her main stressor.  She expresses hating her job and not wanting to work there any more Asked about  self harm- "I ain't gonna kill myself but I wish I'd die."  She denies any specific plan for self harm   She has noted diarrhea for the last couple of days and is not able to eat much.  Has a few bites of a sandwich so far today   Patient Active Problem List   Diagnosis Date Noted  . Right foot pain 02/04/2018  . Left shoulder pain 12/22/2017  . Upper airway cough syndrome 09/14/2017  . Pre-diabetes 11/22/2015  . Morbid obesity due to excess calories (HCC) 11/21/2015  . Ovarian cyst 02/28/2015  . Chest pain, atypical 03/08/2014  . Heart murmur 03/08/2014  . Heart palpitations 03/08/2014  . GAD (generalized anxiety disorder) 02/13/2014  . Depression   . Essential hypertension     Past Medical History:  Diagnosis Date  . Allergy   . Benign hematuria 12/2009    WORKUP WITH DR. NESI WAS NEGATIVE  . Depression   . Diabetes mellitus without complication (HCC)    prediabetes per pt  . Heart murmur   . HTN (hypertension)   . Obesity   . UTI (urinary tract infection), uncomplicated     Past Surgical History:  Procedure Laterality Date  . CHOLECYSTECTOMY  10/2010  . CYSTECTOMY     x 2 from chest    Social History   Tobacco Use  . Smoking status: Never Smoker  . Smokeless tobacco: Never Used  Substance Use Topics  . Alcohol use: Yes    Alcohol/week: 0.0 oz    Comment: occasionally  . Drug use: No    Family History  Problem Relation Age of Onset  . Diabetes Mother   . Heart disease Mother        HAD TRIPLE BYPASS  . Hypertension Mother   . Kidney disease Father   . Breast cancer Sister   . Breast cancer Maternal Grandmother   . Cancer Maternal Grandmother        LYMPH NODE CANCER  . Cancer Maternal Aunt        COLORECTAL   . Colon cancer Neg Hx     Allergies  Allergen Reactions  . Wellbutrin [Bupropion]     Throat swelling   . Sulfonamide Derivatives     Unknown per pt    Medication list has been reviewed and updated.  Current Outpatient Medications on File Prior to Visit  Medication Sig Dispense Refill  . ALPRAZolam (XANAX) 0.5 MG tablet TAKE 1 TABLET BY MOUTH THREE TIMES DAILY AS NEEDED 90 tablet 2  . FLUoxetine (PROZAC) 20 MG tablet Take 3 tablets (60 mg total) by mouth daily. 90 tablet 11  . ibuprofen (ADVIL,MOTRIN) 600 MG tablet Take 600 mg by mouth every 6 (six) hours as needed for headache or moderate pain.    Marland Kitchen irbesartan (AVAPRO) 300 MG tablet Take 1 tablet (300 mg total) daily by mouth. 30 tablet 1  . meloxicam (MOBIC) 15 MG tablet Take 1 tablet (15 mg total) by mouth daily. 30 tablet 1  . metFORMIN (GLUCOPHAGE) 500 MG tablet Take 1 tablet (500 mg total) by mouth daily. 90 tablet 3  . nitroGLYCERIN (NITRODUR - DOSED IN MG/24 HR) 0.2 mg/hr patch Apply 1/4th patch to affected shoulder, change daily 30 patch 1   . norethindrone (MICRONOR,CAMILA,ERRIN) 0.35 MG tablet Take 1 tablet (0.35 mg total) by mouth daily. 3 Package 4  . pantoprazole (PROTONIX) 40 MG tablet Take 1 tablet (40 mg total) daily by mouth. Take 30-60 min before first meal  of the day 30 tablet 2  . predniSONE (DELTASONE) 20 MG tablet Take 2 pills a day for 5 days, then 1 pill a day for 5 days 15 tablet 0  . RaNITidine HCl (ACID REDUCER PO) Take 1 tablet by mouth daily.    Marland Kitchen telmisartan (MICARDIS) 80 MG tablet Take 1 tablet (80 mg total) by mouth daily. 30 tablet 6  . [DISCONTINUED] norethindrone-ethinyl estradiol (JUNEL FE,GILDESS FE,LOESTRIN FE) 1-20 MG-MCG tablet Take 1 tablet by mouth daily. 1 Package 12   No current facility-administered medications on file prior to visit.     Review of Systems:  As per HPI- otherwise negative.   Physical Examination: Vitals:   03/25/18 1524  BP: 134/80  Pulse: 92  Resp: 20  SpO2: 98%   There were no vitals filed for this visit. There is no height or weight on file to calculate BMI. Ideal Body Weight:    GEN: WDWN, NAD, Non-toxic, A & O x 3, obese, tearful HEENT: Atraumatic, Normocephalic. Neck supple. No masses, No LAD. Ears and Nose: No external deformity. CV: RRR, No M/G/R. No JVD. No thrill. No extra heart sounds. PULM: CTA B, no wheezes, crackles, rhonchi. No retractions. No resp. distress. No accessory muscle use. ABD: S,  ND, +BS. No rebound. No HSM. Endorses pain over her right upper quadrant today and over the right mid abdomen  EXTR: No c/c/e NEURO Normal gait.  PSYCH: tearful and upset today.  States several times "I just don't care" when asked how she would like to proceed   Assessment and Plan: Dysthymia  Right upper quadrant pain  Here today with severe RUQ pain, diarrhea, and passive SI Referral to the ER to evaluate her further- appreciate their care of this patient   Signed Abbe Amsterdam, MD

## 2018-03-25 ENCOUNTER — Emergency Department (HOSPITAL_BASED_OUTPATIENT_CLINIC_OR_DEPARTMENT_OTHER): Payer: BLUE CROSS/BLUE SHIELD

## 2018-03-25 ENCOUNTER — Encounter (HOSPITAL_BASED_OUTPATIENT_CLINIC_OR_DEPARTMENT_OTHER): Payer: Self-pay

## 2018-03-25 ENCOUNTER — Other Ambulatory Visit: Payer: Self-pay

## 2018-03-25 ENCOUNTER — Encounter: Payer: Self-pay | Admitting: Family Medicine

## 2018-03-25 ENCOUNTER — Ambulatory Visit (INDEPENDENT_AMBULATORY_CARE_PROVIDER_SITE_OTHER): Payer: BLUE CROSS/BLUE SHIELD | Admitting: Family Medicine

## 2018-03-25 ENCOUNTER — Emergency Department (HOSPITAL_BASED_OUTPATIENT_CLINIC_OR_DEPARTMENT_OTHER)
Admission: EM | Admit: 2018-03-25 | Discharge: 2018-03-25 | Disposition: A | Discharge status: Home / Self Care | Payer: BLUE CROSS/BLUE SHIELD | Attending: Emergency Medicine | Admitting: Emergency Medicine

## 2018-03-25 VITALS — BP 134/80 | HR 92 | Temp 98.5°F | Resp 20

## 2018-03-25 DIAGNOSIS — Z7984 Long term (current) use of oral hypoglycemic drugs: Secondary | ICD-10-CM | POA: Insufficient documentation

## 2018-03-25 DIAGNOSIS — F419 Anxiety disorder, unspecified: Secondary | ICD-10-CM | POA: Diagnosis not present

## 2018-03-25 DIAGNOSIS — F341 Dysthymic disorder: Secondary | ICD-10-CM | POA: Diagnosis not present

## 2018-03-25 DIAGNOSIS — R112 Nausea with vomiting, unspecified: Secondary | ICD-10-CM | POA: Insufficient documentation

## 2018-03-25 DIAGNOSIS — R1011 Right upper quadrant pain: Secondary | ICD-10-CM

## 2018-03-25 DIAGNOSIS — F329 Major depressive disorder, single episode, unspecified: Secondary | ICD-10-CM | POA: Insufficient documentation

## 2018-03-25 DIAGNOSIS — E119 Type 2 diabetes mellitus without complications: Secondary | ICD-10-CM | POA: Insufficient documentation

## 2018-03-25 DIAGNOSIS — F32A Depression, unspecified: Secondary | ICD-10-CM

## 2018-03-25 DIAGNOSIS — Z79899 Other long term (current) drug therapy: Secondary | ICD-10-CM | POA: Insufficient documentation

## 2018-03-25 DIAGNOSIS — I10 Essential (primary) hypertension: Secondary | ICD-10-CM | POA: Insufficient documentation

## 2018-03-25 DIAGNOSIS — R1031 Right lower quadrant pain: Secondary | ICD-10-CM | POA: Diagnosis present

## 2018-03-25 LAB — COMPREHENSIVE METABOLIC PANEL
ALT: 16 U/L (ref 14–54)
AST: 21 U/L (ref 15–41)
Albumin: 3.8 g/dL (ref 3.5–5.0)
Alkaline Phosphatase: 81 U/L (ref 38–126)
Anion gap: 9 (ref 5–15)
BUN: 16 mg/dL (ref 6–20)
CO2: 22 mmol/L (ref 22–32)
CREATININE: 0.91 mg/dL (ref 0.44–1.00)
Calcium: 8.6 mg/dL — ABNORMAL LOW (ref 8.9–10.3)
Chloride: 106 mmol/L (ref 101–111)
Glucose, Bld: 102 mg/dL — ABNORMAL HIGH (ref 65–99)
POTASSIUM: 3.5 mmol/L (ref 3.5–5.1)
Sodium: 137 mmol/L (ref 135–145)
Total Bilirubin: 0.5 mg/dL (ref 0.3–1.2)
Total Protein: 7.1 g/dL (ref 6.5–8.1)

## 2018-03-25 LAB — CBC
HEMATOCRIT: 38.5 % (ref 36.0–46.0)
Hemoglobin: 13.3 g/dL (ref 12.0–15.0)
MCH: 30.4 pg (ref 26.0–34.0)
MCHC: 34.5 g/dL (ref 30.0–36.0)
MCV: 88.1 fL (ref 78.0–100.0)
PLATELETS: 249 10*3/uL (ref 150–400)
RBC: 4.37 MIL/uL (ref 3.87–5.11)
RDW: 13.4 % (ref 11.5–15.5)
WBC: 6.4 10*3/uL (ref 4.0–10.5)

## 2018-03-25 LAB — URINALYSIS, MICROSCOPIC (REFLEX)

## 2018-03-25 LAB — URINALYSIS, ROUTINE W REFLEX MICROSCOPIC
Bilirubin Urine: NEGATIVE
GLUCOSE, UA: NEGATIVE mg/dL
Ketones, ur: NEGATIVE mg/dL
LEUKOCYTES UA: NEGATIVE
Nitrite: NEGATIVE
Protein, ur: NEGATIVE mg/dL
Specific Gravity, Urine: >1.03 — ABNORMAL HIGH (ref 1.005–1.030)
pH: 5.5 (ref 5.0–8.0)

## 2018-03-25 LAB — PREGNANCY, URINE: Preg Test, Ur: NEGATIVE

## 2018-03-25 LAB — LIPASE, BLOOD: LIPASE: 29 U/L (ref 11–51)

## 2018-03-25 MED ORDER — MORPHINE SULFATE (PF) 4 MG/ML IV SOLN
4.0000 mg | Freq: Once | INTRAVENOUS | Status: AC
  Administered 2018-03-25: 4 mg via INTRAVENOUS
  Filled 2018-03-25: qty 1

## 2018-03-25 MED ORDER — SODIUM CHLORIDE 0.9 % IV SOLN
Freq: Once | INTRAVENOUS | Status: AC
  Administered 2018-03-25: 17:00:00 via INTRAVENOUS

## 2018-03-25 MED ORDER — LORAZEPAM 2 MG/ML IJ SOLN
0.5000 mg | Freq: Once | INTRAMUSCULAR | Status: AC
  Administered 2018-03-25: 0.5 mg via INTRAVENOUS
  Filled 2018-03-25: qty 1

## 2018-03-25 MED ORDER — IOPAMIDOL (ISOVUE-300) INJECTION 61%
100.0000 mL | Freq: Once | INTRAVENOUS | Status: AC | PRN
  Administered 2018-03-25: 100 mL via INTRAVENOUS

## 2018-03-25 MED ORDER — DICYCLOMINE HCL 20 MG PO TABS: 20.0000 mg | ORAL_TABLET | Freq: Two times a day (BID) | ORAL | 0 refills | Status: DC

## 2018-03-25 MED ORDER — ONDANSETRON 8 MG PO TBDP: 8.0000 mg | ORAL_TABLET | Freq: Three times a day (TID) | ORAL | 0 refills | Status: DC | PRN

## 2018-03-25 MED ORDER — ONDANSETRON HCL 4 MG/2ML IJ SOLN
4.0000 mg | Freq: Once | INTRAMUSCULAR | Status: AC
  Administered 2018-03-25: 4 mg via INTRAVENOUS
  Filled 2018-03-25: qty 2

## 2018-03-25 NOTE — ED Notes (Signed)
Pt. Asking for more pain meds at this time.  Reports she is having pain in the mid abd. Now.

## 2018-03-25 NOTE — ED Notes (Signed)
Urine was  completed already

## 2018-03-25 NOTE — ED Provider Notes (Signed)
[facility] EMERGENCY DEPARTMENT Provider Note   CSN: [id number] Arrival date & time: 03/25/18  1550     History   Chief Complaint Chief Complaint  Patient presents with  . Abdominal Pain    HPI Ann Walter is a 52 y.o. female.  HPI Ann Walter is a 52 y.o. female presents to emergency department complaining of abdominal pain and depression.  Patient states she has had abdominal pain for last 2 days.  She states pain is mainly in the right lower quadrant.  She states pain is severe.  She reports associated nausea and vomiting.  Denies diarrhea.  No urinary symptoms.  She has not tried Ann medications for this pain prior to coming in.  She states she had similar pain 5 years ago and was diagnosed with cholecystitis, states her gallbladder was removed at that time.  She denies Ann back pain.  No fever or chills.  No other associated symptoms.  She does state that she is also feeling depressed.  She reports that she has had thoughts that she would be better off if she was no longer alive.  She states she has very little family left.  She reports she lost her mom and dad in the last few years.  She does have a husband whom she lives with.  She does not have Ann children.  She lost her job that she has had for 15 years and now does not have insurance.  She states she is unable to afford to pay a therapist.  She does take depression medications and had her does increased a few weeks ago.  She states she feels like is not helping.  She denies Ann suicidal ideations to me, states "I would never kill myself because I do not want to go to hell."  Past Medical History:  Diagnosis Date  . Allergy   . Benign hematuria 12/2009   WORKUP WITH DR. NESI WAS NEGATIVE  . Depression   . Diabetes mellitus without complication (HCC)    prediabetes per pt  . Heart murmur   . HTN (hypertension)   . Obesity   . UTI (urinary tract infection), uncomplicated     Patient Active Problem List   Diagnosis Date Noted  . Right foot pain 02/04/2018  . Left shoulder pain 12/22/2017  . Upper airway cough syndrome 09/14/2017  . Pre-diabetes 11/22/2015  . Morbid obesity due to excess calories (HCC) 11/21/2015  . Ovarian cyst 02/28/2015  . Chest pain, atypical 03/08/2014  . Heart murmur 03/08/2014  . Heart palpitations 03/08/2014  . GAD (generalized anxiety disorder) 02/13/2014  . Depression   . Essential hypertension     Past Surgical History:  Procedure Laterality Date  . CHOLECYSTECTOMY  10/2010  . CYSTECTOMY     x 2 from chest     OB History    Gravida  1   Para      Term      Preterm      AB  1   Living  0     SAB      TAB  1   Ectopic      Multiple      Live Births               Home Medications    Prior to Admission medications   Medication Sig Start Date End Date Taking? Authorizing Provider  ALPRAZolam (XANAX) 0.5 MG tablet TAKE 1 TABLET BY MOUTH THREE TIMES DAILY  AS NEEDED 02/15/18   Copland, Gwenlyn Found, MD  FLUoxetine (PROZAC) 20 MG tablet Take 3 tablets (60 mg total) by mouth daily. 03/03/18   Copland, Gwenlyn Found, MD  ibuprofen (ADVIL,MOTRIN) 600 MG tablet Take 600 mg by mouth every 6 (six) hours as needed for headache or moderate pain.    [provider]  irbesartan (AVAPRO) 300 MG tablet Take 1 tablet (300 mg total) daily by mouth. 09/14/17   Nyoka Cowden, MD  meloxicam (MOBIC) 15 MG tablet Take 1 tablet (15 mg total) by mouth daily. 12/22/17 12/22/18  Lenda Kelp, MD  metFORMIN (GLUCOPHAGE) 500 MG tablet Take 1 tablet (500 mg total) by mouth daily. 02/22/18   Copland, Gwenlyn Found, MD  nitroGLYCERIN (NITRODUR - DOSED IN MG/24 HR) 0.2 mg/hr patch Apply 1/4th patch to affected shoulder, change daily 12/22/17   Hudnall, Azucena Fallen, MD  norethindrone (MICRONOR,CAMILA,ERRIN) 0.35 MG tablet Take 1 tablet (0.35 mg total) by mouth daily. 03/31/17   Harrington Challenger, NP  pantoprazole (PROTONIX) 40 MG tablet Take 1 tablet (40 mg total) daily by  mouth. Take 30-60 min before first meal of the day 09/14/17   Nyoka Cowden, MD  predniSONE (DELTASONE) 20 MG tablet Take 2 pills a day for 5 days, then 1 pill a day for 5 days 02/22/18   Copland, Gwenlyn Found, MD  RaNITidine HCl (ACID REDUCER PO) Take 1 tablet by mouth daily.    [provider]  telmisartan (MICARDIS) 80 MG tablet Take 1 tablet (80 mg total) by mouth daily. 09/25/17   Nyoka Cowden, MD  norethindrone-ethinyl estradiol (JUNEL FE,GILDESS FE,LOESTRIN FE) 1-20 MG-MCG tablet Take 1 tablet by mouth daily. 01/07/12 01/08/12  Harrington Challenger, NP    Family History Family History  Problem Relation Age of Onset  . Diabetes Mother   . Heart disease Mother        HAD TRIPLE BYPASS  . Hypertension Mother   . Kidney disease Father   . Breast cancer Sister   . Breast cancer Maternal Grandmother   . Cancer Maternal Grandmother        LYMPH NODE CANCER  . Cancer Maternal Aunt        COLORECTAL   . Colon cancer Neg Hx     Social History Social History   Tobacco Use  . Smoking status: Never Smoker  . Smokeless tobacco: Never Used  Substance Use Topics  . Alcohol use: Yes    Alcohol/week: 0.0 oz    Comment: occasionally  . Drug use: No     Allergies   Wellbutrin [bupropion] and Sulfonamide derivatives   Review of Systems Review of Systems  Constitutional: Negative for chills and fever.  Respiratory: Negative for cough, chest tightness and shortness of breath.   Cardiovascular: Negative for chest pain, palpitations and leg swelling.  Gastrointestinal: Positive for abdominal pain and vomiting. Negative for diarrhea and nausea.  Genitourinary: Negative for dysuria, flank pain, pelvic pain, vaginal bleeding, vaginal discharge and vaginal pain.  Musculoskeletal: Negative for arthralgias, myalgias, neck pain and neck stiffness.  Skin: Negative for rash.  Neurological: Negative for dizziness, weakness and headaches.  Psychiatric/Behavioral: Positive for dysphoric mood.  The patient is nervous/anxious.   All other systems reviewed and are negative.    Physical Exam Updated Vital Signs BP 139/87 (BP Location: Right Arm)   Pulse 99   Temp 98.4 F (36.9 C) (Oral)   Resp 20   Ht 5' (1.524 m)   Wt 122.5  kg (270 lb)   SpO2 98%   BMI 52.73 kg/m   Physical Exam  Constitutional: She appears well-developed and well-nourished. No distress.  HENT:  Head: Normocephalic.  Eyes: Conjunctivae are normal.  Neck: Neck supple.  Cardiovascular: Normal rate, regular rhythm and normal heart sounds.  Pulmonary/Chest: Effort normal and breath sounds normal. No respiratory distress. She has no wheezes. She has no rales.  Abdominal: Soft. Bowel sounds are normal. She exhibits no distension. There is tenderness. There is no rebound.  Right lower quadrant tenderness.  Tender at McBurney's point no guarding or rebound tenderness  Musculoskeletal: She exhibits no edema.  Neurological: She is alert.  Skin: Skin is warm and dry.  Psychiatric:  Patient is tearful, crying entire time during history and examination.  Reports increased anxiety and depression, passive suicidal thoughts, however denies that she will hurt herself.  Nursing note and vitals reviewed.    ED Treatments / Results  Labs (all labs ordered are listed, but only abnormal results are displayed) Labs Reviewed  COMPREHENSIVE METABOLIC PANEL - Abnormal; Notable for the following components:      Result Value   Glucose, Bld 102 (*)    Calcium 8.6 (*)    All other components within normal limits  URINALYSIS, ROUTINE W REFLEX MICROSCOPIC - Abnormal; Notable for the following components:   Specific Gravity, Urine >1.030 (*)    Hgb urine dipstick MODERATE (*)    All other components within normal limits  URINALYSIS, MICROSCOPIC (REFLEX) - Abnormal; Notable for the following components:   Bacteria, UA MANY (*)    All other components within normal limits  LIPASE, BLOOD  CBC  PREGNANCY, URINE     EKG None  Radiology Ct Abdomen Pelvis W Contrast  Result Date: 03/25/2018 CLINICAL DATA:  RUQ pain for two days with vomiting and diarrheaHx HTN, DM, cystectomy, cholecystectomy EXAM: CT ABDOMEN AND PELVIS WITH CONTRAST TECHNIQUE: Multidetector CT imaging of the abdomen and pelvis was performed using the standard protocol following bolus administration of intravenous contrast. CONTRAST:  ISOVUE-300 IOPAMIDOL (ISOVUE-300) INJECTION 61% COMPARISON:  11/14/2014 FINDINGS: Lower chest: No acute abnormality. Hepatobiliary: Liver normal size. No masses or focal lesions. Gallbladder surgically absent. There is mild intra and extrahepatic bile duct dilation. Common bile duct measures 12 mm in diameter proximally but shows normal distal tapering. This has increased when compared the prior CT. Pancreas: Unremarkable. No pancreatic ductal dilatation or surrounding inflammatory changes. Spleen: Normal in size without focal abnormality. Adrenals/Urinary Tract: Left adrenal mass measuring 12 mm, stable from the prior study consistent with an adenoma. Normal right adrenal gland. Kidneys normal size, orientation and position. There is symmetric renal enhancement and excretion. No masses, stones or hydronephrosis. Normal ureters. Normal bladder. Stomach/Bowel: Stomach is within normal limits. Appendix appears normal. No evidence of bowel wall thickening, distention, or inflammatory changes. Vascular/Lymphatic: No significant vascular findings are present. No enlarged abdominal or pelvic lymph nodes. Reproductive: Uterus and bilateral adnexa are unremarkable. Other: No abdominal wall hernia or abnormality. No abdominopelvic ascites. Musculoskeletal: No fracture or acute finding. No osteoblastic or osteolytic lesions. IMPRESSION: 1. There is intra and extrahepatic bile duct dilation that has increased when compared to the prior CT. The common bile duct shows normal distal tapering. This dilation is most likely  chronic. Consider duct obstruction if there are consistent clinical findings, which can be further evaluated with a ERCP or MRCP. 2. No other evidence of an acute abnormality. No other findings to account for right upper quadrant pain. 3.  Status post cholecystectomy. 4. Stable left adrenal adenoma. Electronically Signed   By: Amie Portland M.D.   On: 03/25/2018 19:13    Procedures Procedures (including critical care time)  Medications Ordered in ED Medications  morphine 4 MG/ML injection 4 mg (4 mg Intravenous Given 03/25/18 1658)  LORazepam (ATIVAN) injection 0.5 mg (0.5 mg Intravenous Given 03/25/18 1655)  ondansetron (ZOFRAN) injection 4 mg (4 mg Intravenous Given 03/25/18 1656)  0.9 %  sodium chloride infusion ( Intravenous New Bag/Given 03/25/18 1654)     Initial Impression / Assessment and Plan / ED Course  I have reviewed the triage vital signs and the nursing notes.  Pertinent labs & imaging results that were available during my care of the patient were reviewed by me and considered in my medical decision making (see chart for details).     Patient with right lower quadrant abdominal pain, appears to be in a lot of pain.  Will try pain medication will get labs, urinalysis, will get CT abdomen and pelvis.  Patient is passively suicidal, however denies that she will hurt herself.  Will give Ativan.  7:54 PM Patient's lab work is all normal.  She is got normal white blood cell count.  Normal LFTs and lipase.  Her urinalysis is clear, with elevated specific gravity, and moderate hemoglobin, negative nitrites and negative leukocytes, microscopic exam shows many bacteria with no WBCs.  I will send culture.  She does not have Ann urinary symptoms so will not treat at this time.  Her pain on my exam and evaluation is mainly in the right LOWER quadrant.  She has no right upper quadrant tenderness although I have noticed that it prior to this visit, patient had right upper quadrant tenderness and  primary care doctor's office.  Her CT scan did show intra-and extrahepatic bile duct dilation and that it increased in size to prior CT.  It is mentioned the dilation is most likely chronic.  Again her LFTs and lipase are normal, and further evaluation of this could be done on outpatient basis.  There is no findings on CT scan to explain her right lower quadrant pain.  She appears to be more calm at this time.  She received some IV fluids in the department.  Her vital signs are all within normal.  We discussed her CT results and outpatient follow-up which she agrees.  I have offered her resources for her depression and anxiety.  Patient stated that she is not interested in our resources and she does not want to see a psychiatrist or therapist because they cost money and because she does not like Ann of them.  I spent a long time speaking with patient about her stressors.  It sounds like she lost her mom and dad in the last several years.  She is having some financial problems and problems with her husband.  She lost her job at 49 years and now has to work a job that she despises.  Sounds like she has no hobbies, and does not do anything that would cause her joy.  She again admitted to me that she would not hurt herself.  She stated that at this point in time her life has no meaning and she would be okay if she passed away, however again stated that she is not planning on harming herself.  At this time I will discharge her home with Zofran and Bentyl to help with her abdominal symptoms.  I will have her follow-up with  primary care doctor closely for further evaluation and treatment.  Again she declined all our resources.   Vitals:   03/25/18 1558 03/25/18 1559 03/25/18 1601 03/25/18 1920  BP:   139/87 111/66  Pulse: 99   85  Resp: 20   20  Temp: 98.4 F (36.9 C)     TempSrc: Oral     SpO2: 98%   98%  Weight:  122.5 kg (270 lb)    Height:  5' (1.524 m)      Final Clinical Impressions(s) / ED Diagnoses    Final diagnoses:  None    ED Discharge Orders    None       Jaynie Crumble, PA-C 03/25/18 2002    Long, Joshua G, MD 03/26/18 [phone]

## 2018-03-25 NOTE — ED Notes (Signed)
Waiting for husband to d/c home

## 2018-03-25 NOTE — Patient Instructions (Signed)
Please proceed to the ER on the ground floor for further evaluation and treatment.  I hope that you feel much better soon!

## 2018-03-25 NOTE — Discharge Instructions (Addendum)
Continue all your current medications.  Take Zofran as prescribed for nausea and vomiting.  Take Bentyl for abdominal cramping.  Follow-up with your family doctor for further evaluation and treatment

## 2018-03-25 NOTE — ED Notes (Signed)
Asked Pt. About having a ride home after EDP offered pain meds  To Pt.     Pt. Ann Walter she will get a  Ride home.

## 2018-03-25 NOTE — ED Notes (Signed)
Encouraged pt. To call if she can give urine sample.  Pt. Was asked before meds given Pt. Went to restroom... Was unable to give sample.

## 2018-03-25 NOTE — ED Triage Notes (Signed)
Apparently pt was sent from upstairs, is unaccompanied, c/o RUQ pain for two days with vomiting and diarrhea

## 2018-03-25 NOTE — ED Notes (Signed)
Pt

## 2018-03-25 NOTE — ED Notes (Signed)
Pt ambulatory out of the dept. Pt has 2 rides here with her.

## 2018-03-25 NOTE — ED Notes (Signed)
Pt. Very tearful with reports she is not here for depression or anything besides lower R abd. Pain.  Pt. Reports she has had a lot to happen lately and she hates her job.  Pt. Reports she is not suicidal and not homicidal.

## 2018-03-26 ENCOUNTER — Encounter: Payer: Self-pay | Admitting: Family Medicine

## 2018-03-26 ENCOUNTER — Other Ambulatory Visit: Payer: Self-pay | Admitting: Family Medicine

## 2018-03-26 DIAGNOSIS — R0683 Snoring: Secondary | ICD-10-CM

## 2018-03-27 LAB — URINE CULTURE: Culture: <10000 — AB

## 2018-03-30 ENCOUNTER — Encounter: Payer: Self-pay | Admitting: Family Medicine

## 2018-05-02 ENCOUNTER — Other Ambulatory Visit: Payer: Self-pay | Admitting: Internal Medicine

## 2018-05-11 ENCOUNTER — Encounter: Payer: Self-pay | Admitting: Family Medicine

## 2018-05-11 DIAGNOSIS — R058 Other specified cough: Secondary | ICD-10-CM

## 2018-05-11 DIAGNOSIS — R05 Cough: Secondary | ICD-10-CM

## 2018-05-11 DIAGNOSIS — I10 Essential (primary) hypertension: Secondary | ICD-10-CM

## 2018-05-11 DIAGNOSIS — F411 Generalized anxiety disorder: Secondary | ICD-10-CM

## 2018-05-12 ENCOUNTER — Ambulatory Visit: Payer: BLUE CROSS/BLUE SHIELD | Admitting: Family Medicine

## 2018-05-12 MED ORDER — TELMISARTAN 80 MG PO TABS: 80.0000 mg | ORAL_TABLET | Freq: Every day | ORAL | 3 refills | Status: DC

## 2018-05-12 MED ORDER — ALPRAZOLAM 0.5 MG PO TABS: 0.5000 mg | ORAL_TABLET | Freq: Three times a day (TID) | ORAL | 2 refills | Status: DC | PRN

## 2018-05-12 NOTE — Telephone Encounter (Signed)
Pt has both telmisartan and irbersartan on her med list, but it looks like she has been taking the telmisartan.  Will dc the other drug   [facility]:  04/13/2018  1  02/15/2018  Alprazolam 0.5 Mg Tablet  90 30 Je Cop  [id number]  Wal (2001)  2 3.00 LME Comm Ins  [place]  03/16/2018  1  02/15/2018  Alprazolam 0.5 Mg Tablet  90 30 Je Cop  [id number]  Wal (2001)  1 3.00 LME Comm Ins  [place]  02/15/2018  1  02/15/2018  Alprazolam 0.5 Mg Tablet  90 30 Je Cop  [id number]  Wal (2001)  0 3.00 LME Comm Ins  [place]  01/16/2018  1  12/17/2017  Alprazolam 0.5 Mg Tablet  90 30 Je Cop  [id number]  Wal (2001)  1 3.00 LME Comm Ins  [place]  12/17/2017  1  12/17/2017  Alprazolam 0.5 Mg Tablet  90 30 Je Cop  [id number]  Wal (2001)  0      Ok to refill her xanax

## 2018-05-17 ENCOUNTER — Encounter: Payer: Self-pay | Admitting: Neurology

## 2018-05-17 ENCOUNTER — Ambulatory Visit (INDEPENDENT_AMBULATORY_CARE_PROVIDER_SITE_OTHER): Payer: BLUE CROSS/BLUE SHIELD | Admitting: Neurology

## 2018-05-17 VITALS — BP 164/89 | HR 74 | Ht 63.5 in | Wt 268.3 lb

## 2018-05-17 DIAGNOSIS — R351 Nocturia: Secondary | ICD-10-CM | POA: Diagnosis not present

## 2018-05-17 DIAGNOSIS — R519 Headache, unspecified: Secondary | ICD-10-CM

## 2018-05-17 DIAGNOSIS — R0683 Snoring: Secondary | ICD-10-CM

## 2018-05-17 DIAGNOSIS — Z6841 Body Mass Index (BMI) 40.0 and over, adult: Secondary | ICD-10-CM

## 2018-05-17 DIAGNOSIS — G4719 Other hypersomnia: Secondary | ICD-10-CM

## 2018-05-17 DIAGNOSIS — R51 Headache: Secondary | ICD-10-CM

## 2018-05-17 DIAGNOSIS — Z820 Family history of epilepsy and other diseases of the nervous system: Secondary | ICD-10-CM | POA: Diagnosis not present

## 2018-05-17 NOTE — Patient Instructions (Signed)

## 2018-05-17 NOTE — Progress Notes (Signed)
Subjective:    Patient ID: Ann Walter is a 52 y.o. female.  HPI     Huston Foley, MD, PhD [facility] [address] [address] [place], [place] [address]  Dear Dr. Patsy Lager,   I saw your patient, Ann Walter, upon your kind request, in my neurologic clinic today for initial consultation of her sleep disorder, in particular, concern for underlying obstructive sleep apnea. The patient is unaccompanied today. As you know, Ann Walter is a 52 year old right-handed woman with an underlying medical history of anxiety, prediabetes, palpitations, depression, hypertension, allergies, and morbid obesity with a BMI of over 45, who reports snoring and excessive daytime somnolence. I reviewed your office note from 03/25/2018, which you kindly included. Her Epworth sleepiness score is 10 out of 24 today, fatigue score is 20 out of 63. She is married and lives with her husband, they have no children. She works at [employer]. She is a nonsmoker and drinks alcohol occasionally, drinks caffeine in the form of [facility]. She has gained weight in the past few years. She lost her mother about 4 years ago and recently lost her brother at age 8. Her bedtime and rise time vary. Sleep is interrupted by nocturia which is on average twice per night. She also has a puppy that wakes her up early morning. She has woken up with a headache at times. Her mother and her brother both had obstructive sleep apnea. Mother died at age 50.  Her Past Medical History Is Significant For: Past Medical History:  Diagnosis Date  . Allergy   . Benign hematuria 12/2009   WORKUP WITH DR. NESI WAS NEGATIVE  . Depression   . Diabetes mellitus without complication (HCC)    prediabetes per pt  . Heart murmur   . HTN (hypertension)   . Obesity   . UTI (urinary tract infection), uncomplicated     Her Past Surgical History Is Significant For: Past Surgical History:  Procedure Laterality Date  .  CHOLECYSTECTOMY  10/2010  . CYSTECTOMY     x 2 from chest    Her Family History Is Significant For: Family History  Problem Relation Age of Onset  . Diabetes Mother   . Heart disease Mother        HAD TRIPLE BYPASS  . Hypertension Mother   . Kidney disease Father   . Breast cancer Sister   . Breast cancer Maternal Grandmother   . Cancer Maternal Grandmother        LYMPH NODE CANCER  . Cancer Maternal Aunt        COLORECTAL   . Colon cancer Neg Hx     Her Social History Is Significant For: Social History   Socioeconomic History  . Marital status: Married    Spouse name: Not on file  . Number of children: 0  . Years of education: Not on file  . Highest education level: Not on file  Occupational History  . 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 Occupation: HAZARD/FLOOD    Employer: [employer]  Social Needs  . Financial resource strain: Not on file  . Food insecurity:    Worry: Not on file    Inability: Not on file  . Transportation needs:    Medical: Not on file    Non-medical: Not on file  Tobacco Use  . Smoking status: Never Smoker  . Smokeless tobacco: Never Used  Substance and Sexual Activity  .  Alcohol use: Yes    Alcohol/week: 0.0 oz    Comment: occasionally  . Drug use: No  . Sexual activity: Yes    Partners: Male    Birth control/protection: Pill    Comment: INTERCOURSE AGE 41, SEXUAL PARTNERS MORE THAN 5  Lifestyle  . Physical activity:    Days per week: Not on file    Minutes per session: Not on file  . Stress: Not on file  Relationships  . Social connections:    Talks on phone: Not on file    Gets together: Not on file    Attends religious service: Not on file    Active member of club or organization: Not on file    Attends meetings of clubs or organizations: Not on file    Relationship status: Not on file  Other Topics Concern  . Not on file  Social History Narrative   Married with no children   She worked  with [employer] for 15 years    Her Allergies Are:  Allergies  Allergen Reactions  . Wellbutrin [Bupropion]     Throat swelling   . Sulfonamide Derivatives     Unknown per pt  :   Her Current Medications Are:  Outpatient Encounter Medications as of 05/17/2018  Medication Sig  . ALPRAZolam (XANAX) 0.5 MG tablet Take 1 tablet (0.5 mg total) by mouth 3 (three) times daily as needed.  Marland Kitchen. FLUoxetine (PROZAC) 20 MG tablet Take 3 tablets (60 mg total) by mouth daily.  . metFORMIN (GLUCOPHAGE) 500 MG tablet Take 1 tablet (500 mg total) by mouth daily.  . norethindrone (MICRONOR,CAMILA,ERRIN) 0.35 MG tablet Take 1 tablet (0.35 mg total) by mouth daily.  Marland Kitchen. telmisartan (MICARDIS) 80 MG tablet Take 1 tablet (80 mg total) by mouth daily.  . [DISCONTINUED] dicyclomine (BENTYL) 20 MG tablet Take 1 tablet (20 mg total) by mouth 2 (two) times daily.  . [DISCONTINUED] ibuprofen (ADVIL,MOTRIN) 600 MG tablet Take 600 mg by mouth every 6 (six) hours as needed for headache or moderate pain.  . [DISCONTINUED] meloxicam (MOBIC) 15 MG tablet Take 1 tablet (15 mg total) by mouth daily.  . [DISCONTINUED] nitroGLYCERIN (NITRODUR - DOSED IN MG/24 HR) 0.2 mg/hr patch Apply 1/4th patch to affected shoulder, change daily  . [DISCONTINUED] norethindrone-ethinyl estradiol (JUNEL FE,GILDESS FE,LOESTRIN FE) 1-20 MG-MCG tablet Take 1 tablet by mouth daily.  . [DISCONTINUED] ondansetron (ZOFRAN ODT) 8 MG disintegrating tablet Take 1 tablet (8 mg total) by mouth every 8 (eight) hours as needed for nausea or vomiting.  . [DISCONTINUED] pantoprazole (PROTONIX) 40 MG tablet Take 1 tablet (40 mg total) daily by mouth. Take 30-60 min before first meal of the day  . [DISCONTINUED] RaNITidine HCl (ACID REDUCER PO) Take 1 tablet by mouth daily.   No facility-administered encounter medications on file as of 05/17/2018.   :  Review of Systems:  Out of a complete 14 point review of systems, all are reviewed and negative with the  exception of these symptoms as listed below: Review of Systems  Neurological:       New patient to discuss sleep. Patient reports snoring.   Epworth Sleepiness Scale 0= would never doze 1= slight chance of dozing 2= moderate chance of dozing 3= high chance of dozing  Sitting and reading: 0 Watching TV:3 Sitting inactive in a public place (ex. Theater or meeting):2 As a passenger in a car for an hour without a break:2 Lying down to rest in the afternoon:2 Sitting and  talking to someone:0 Sitting quietly after lunch (no alcohol):0 In a car, while stopped in traffic:1 Total:10       Objective:  Neurological Exam  Physical Exam Physical Examination:   Vitals:   05/17/18 1333  BP: (!) 164/89  Pulse: 74    General Examination: The patient is a very pleasant 52 y.o. female in no acute distress. She appears well-developed and well-nourished and well groomed.   HEENT: Normocephalic, atraumatic, pupils are equal, round and reactive to light and accommodation. Extraocular tracking is good without limitation to gaze excursion or nystagmus noted. Normal smooth pursuit is noted. Hearing is grossly intact. Face is symmetric with normal facial animation and normal facial sensation. Speech is clear with no dysarthria noted. There is no hypophonia. There is no lip, neck/head, jaw or voice tremor. Neck is supple with full range of passive and active motion. There are no carotid bruits on auscultation. Oropharynx exam reveals: moderate mouth dryness, adequate dental hygiene and moderate airway crowding, due to smaller airway entry, redundant uvula, wider uvula, tonsils in place, about 1+ b/l. Mallampati is class II. Tongue protrudes centrally and palate elevates symmetrically. Neck size is 17 inches. She has a underbite.    Chest: Clear to auscultation without wheezing, rhonchi or crackles noted.  Heart: S1+S2+0, regular and normal without murmurs, rubs or gallops noted.   Abdomen: Soft,  non-tender and non-distended with normal bowel sounds appreciated on auscultation.  Extremities: There is trace pitting edema in the right ankle.   Skin: Warm and dry without trophic changes noted.  Musculoskeletal: exam reveals no obvious joint deformities, tenderness or joint swelling or erythema. Genua valgi.  Neurologically:  Mental status: The patient is awake, alert and oriented in all 4 spheres. Her immediate and remote memory, attention, language skills and fund of knowledge are appropriate. There is no evidence of aphasia, agnosia, apraxia or anomia. Speech is clear with normal prosody and enunciation. Thought process is linear. Mood is normal and affect is normal.  Cranial nerves II - XII are as described above under HEENT exam. In addition: shoulder shrug is normal with equal shoulder height noted. Motor exam: Normal bulk, strength and tone is noted. There is no drift, tremor or rebound. Romberg is negative. Reflexes are 2+ throughout. Fine motor skills and coordination: intact with normal finger taps, normal hand movements, normal rapid alternating patting, normal foot taps and normal foot agility.  Cerebellar testing: No dysmetria or intention tremor. There is no truncal or gait ataxia.  Sensory exam: intact to light touch in the upper and lower extremities.  Gait, station and balance: She stands easily. No veering to one side is noted. No leaning to one side is noted. Posture is age-appropriate and stance is narrow based. Gait shows normal stride length and normal pace. No problems turning are noted. Tandem walk is difficult for her.   Assessment and Plan:   In summary, JAYLYN IYER is a very pleasant 52 y.o.-year old female with an underlying medical history of anxiety, prediabetes, palpitations, depression, hypertension, allergies, and morbid obesity with a BMI of over 45, whose history and physical exam are concerning for obstructive sleep apnea (OSA). I had a long chat with the  patient about my findings and the diagnosis of OSA, its prognosis and treatment options. We talked about medical treatments, surgical interventions and non-pharmacological approaches. I explained in particular the risks and ramifications of untreated moderate to severe OSA, especially with respect to developing cardiovascular disease down the Road, including congestive  heart failure, difficult to treat hypertension, cardiac arrhythmias, or stroke. Even type 2 diabetes has, in part, been linked to untreated OSA. Symptoms of untreated OSA include daytime sleepiness, memory problems, mood irritability and mood disorder such as depression and anxiety, lack of energy, as well as recurrent headaches, especially morning headaches. We talked about trying to maintain a healthy lifestyle in general, as well as the importance of weight control. I encouraged the patient to eat healthy, exercise daily and keep well hydrated, to keep a scheduled bedtime and wake time routine, to not skip any meals and eat healthy snacks in between meals. I advised the patient not to drive when feeling sleepy. I recommended the following at this time: sleep study with potential positive airway pressure titration. (We will score hypopneas at 3%).   I explained the sleep test procedure to the patient and also outlined possible surgical and non-surgical treatment options of OSA, including the use of a custom-made dental device (which would require a referral to a specialist dentist or oral surgeon), upper airway surgical options, such as pillar implants, radiofrequency surgery, tongue base surgery, and UPPP (which would involve a referral to an ENT surgeon). Rarely, jaw surgery such as mandibular advancement may be considered.  I also explained the CPAP treatment option to the patient, who indicated that she would be willing to try CPAP if the need arises. I explained the importance of being compliant with PAP treatment, not only for insurance  purposes but primarily to improve Her symptoms, and for the patient's long term health benefit, including to reduce Her cardiovascular risks. I answered all her questions today and the patient was in agreement. I would like to see her back after the sleep study is completed and encouraged her to call with any interim questions, concerns, problems or updates.   Thank you very much for allowing me to participate in the care of this nice patient. If I can be of any further assistance to you please do not hesitate to call me at [phone].  Sincerely,   Huston Foley, MD, PhD

## 2018-05-19 ENCOUNTER — Telehealth: Payer: Self-pay | Admitting: *Deleted

## 2018-05-19 DIAGNOSIS — Z3041 Encounter for surveillance of contraceptive pills: Secondary | ICD-10-CM

## 2018-05-19 MED ORDER — NORETHINDRONE 0.35 MG PO TABS: 1.0000 | ORAL_TABLET | Freq: Every day | ORAL | 0 refills | Status: DC

## 2018-05-19 NOTE — Telephone Encounter (Signed)
Patient has annual exam scheduled on 07/07/18, needs refill on birth control pills. Rx sent.

## 2018-05-30 ENCOUNTER — Ambulatory Visit (INDEPENDENT_AMBULATORY_CARE_PROVIDER_SITE_OTHER): Payer: BLUE CROSS/BLUE SHIELD | Admitting: Neurology

## 2018-05-30 DIAGNOSIS — G4733 Obstructive sleep apnea (adult) (pediatric): Secondary | ICD-10-CM | POA: Diagnosis not present

## 2018-05-30 DIAGNOSIS — R519 Headache, unspecified: Secondary | ICD-10-CM

## 2018-05-30 DIAGNOSIS — R0683 Snoring: Secondary | ICD-10-CM

## 2018-05-30 DIAGNOSIS — Z6841 Body Mass Index (BMI) 40.0 and over, adult: Secondary | ICD-10-CM

## 2018-05-30 DIAGNOSIS — Z820 Family history of epilepsy and other diseases of the nervous system: Secondary | ICD-10-CM

## 2018-05-30 DIAGNOSIS — R51 Headache: Secondary | ICD-10-CM

## 2018-05-30 DIAGNOSIS — G4761 Periodic limb movement disorder: Secondary | ICD-10-CM

## 2018-05-30 DIAGNOSIS — G4719 Other hypersomnia: Secondary | ICD-10-CM

## 2018-05-30 DIAGNOSIS — R351 Nocturia: Secondary | ICD-10-CM

## 2018-06-03 ENCOUNTER — Telehealth: Payer: Self-pay | Admitting: Neurology

## 2018-06-03 NOTE — Telephone Encounter (Signed)
-----   Message from Huston FoleySaima Athar, MD sent at 06/03/2018  7:43 AM EDT ----- Patient referred by Dr. Patsy Lageropland, seen by me on 05/17/18, split night sleep study on 05/30/18. Please call and notify patient that the recent sleep study confirmed the diagnosis of severe OSA. She did well with CPAP during the study with significant improvement of the respiratory events. Therefore, I would like start the patient on CPAP therapy at home by prescribing a machine for home use. I placed the order in the chart.  Please advise patient that we need a follow up appointment with either myself or one of our nurse practitioners in about 10 weeks post set-up to check for how the patient is feeling and how well the patient is using the machine, etc. Please go ahead and schedule the appointment, while you have the patient on the phone and make sure patient understands the importance of keeping this window for the FU appointment, as it is often an insurance requirement. Failing to adhere to this may result in losing coverage for sleep apnea treatment, at which point most patients are left with a choice of returning the machine or paying out of pocket (and we want neither of this to happen!).  Please re-enforce the importance of compliance with treatment and the need for us to monitor compliance data - again an insurance requirement and usually a good feedback for the patient as far as how they are doing.  Also remind patient, that any PAP machine or mask issues should be first addressed with the DME company, who provided the machine/mask.  Please ask if patient has a preference regarding DME company, may depend on the insurance too.  Please arrange for CPAP set up at home through a DME company of patient's choice.  Once you have spoken to the patient you can close the phone encounter. Please fax/route report to referring provider, thanks,   Huston FoleySaima Athar, MD, PhD [facility])

## 2018-06-03 NOTE — Procedures (Signed)
PATIENT'S NAME:  Ann Walter, [name] DOB:      1966/06/22      MR#:    [id number]     DATE OF RECORDING: 05/30/2018 REFERRING M.D.:  Abbe AmsterdamJessica Copland, MD Study Performed:  Split-Night Titration Study HISTORY: 52 year old woman with a history of anxiety, prediabetes, palpitations, depression, hypertension, allergies, and morbid obesity, who reports snoring and excessive daytime somnolence. The patient endorsed the Epworth Sleepiness Scale at 10/24 points. The patient's weight 268 pounds with a height of 64 (inches), resulting in a BMI of 47.1 kg/m2. The patient's neck circumference measured 17 inches.  CURRENT MEDICATIONS: Xanax, Prozac, Glucophage, Micronor, Micardis.  PROCEDURE:  This is a multichannel digital polysomnogram utilizing the Somnostar 11.2 system.  Electrodes and sensors were applied and monitored per AASM Specifications.   EEG, EOG, Chin and Limb EMG, were sampled at 200 Hz.  ECG, Snore and Nasal Pressure, Thermal Airflow, Respiratory Effort, CPAP Flow and Pressure, Oximetry was sampled at 50 Hz. Digital video and audio were recorded.      BASELINE STUDY WITHOUT CPAP RESULTS:  Lights Out was at 22:30 and Lights On at 04:59 for the night, split start at 01:06. Total recording time (TRT) was 155, with a total sleep time (TST) of 124 minutes. The patient's sleep latency was 22 minutes.  REM latency was 0 minutes.  The sleep efficiency was 80. %.    SLEEP ARCHITECTURE: WASO (Wake after sleep onset) was 2 minutes, Stage N1 was 3.5 minutes, Stage N2 was 120.5 minutes, Stage N3 was 0 minutes and Stage R (REM sleep) was 0 minutes.  The percentages were Stage N1 2.8%, Stage N2 97.2%, which is markedly increased, Stage N3 and Stage R (REM sleep) were absent. The arousals were noted as: 11 were spontaneous, 13 were associated with PLMs, 71 were associated with respiratory events.  RESPIRATORY ANALYSIS:  There were a total of 123 respiratory events:  1 obstructive apneas, 0 central apneas and 0 mixed  apneas with a total of 1 apneas and an apnea index (AI) of .5. There were 122 hypopneas with a hypopnea index of 59.. The patient also had 0 respiratory event related arousals (RERAs).  Snoring was noted.     The total APNEA/HYPOPNEA INDEX (AHI) was 59.5 /hour and the total RESPIRATORY DISTURBANCE INDEX was 59.5 /hour.  0 events occurred in REM sleep and 244 events in NREM. The REM AHI was n/a versus a non-REM AHI of 59.5 /hour. The patient spent 229 minutes sleep time in the supine position 113 minutes in non-supine. The supine AHI was 51.0 /hour versus a non-supine AHI of 82.4 /hour.  OXYGEN SATURATION & C02:  The wake baseline 02 saturation was 96%, with the lowest being 84%. Time spent below 89% saturation equaled 18 minutes.  PERIODIC LIMB MOVEMENTS: The patient had a total of 64 Periodic Limb Movements.  The Periodic Limb Movement (PLM) index was 31. /hour and the PLM Arousal index was 6.3 /hour. Audio and video analysis did not show any abnormal or unusual movements, behaviors, phonations or vocalizations. The patient took 1 bathroom break for the night. Moderate snoring was noted. The EKG was in keeping with normal sinus rhythm (NSR).   TITRATION STUDY WITH CPAP RESULTS:   The patient was fitted with a small Simplus FFM, due to mouth venting noted. CPAP was initiated at 5 cmH20 with heated humidity per AASM split night standards and pressure was advanced to 12 cmH20 because of hypopneas, apneas and desaturations.  At a PAP pressure  of 12 cmH20, there was a reduction of the AHI to 1.4 /hour, with supine REM sleep achieved and O2 nadir of 92%.   Total recording time (TRT) was 234.5 minutes, with a total sleep time (TST) of 218 minutes. The patient's sleep latency was 11.5 minutes. REM latency was 52 minutes.  The sleep efficiency was 93. %.    SLEEP ARCHITECTURE: Wake after sleep was 4.5 minutes, Stage N1 2.5 minutes, Stage N2 127 minutes, Stage N3 18 minutes and Stage R (REM sleep) 70.5  minutes. The percentages were: Stage N1 1.1%, Stage N2 58.3%, Stage N3 8.3% and Stage R (REM sleep) 32.3%, which is increased and in keeping with rebound. The arousals were noted as: 7 were spontaneous, 3 were associated with PLMs, 15 were associated with respiratory events.  RESPIRATORY ANALYSIS:  There were a total of 59 respiratory events: 3 obstructive apneas, 0 central apneas and 0 mixed apneas with a total of 3 apneas and an apnea index (AI) of .8. There were 56 hypopneas with a hypopnea index of 15.4 /hour. The patient also had 0 respiratory event related arousals (RERAs).      The total APNEA/HYPOPNEA INDEX  (AHI) was 16.2 /hour and the total RESPIRATORY DISTURBANCE INDEX was 16.2 /hour.  3 events occurred in REM sleep and 56 events in NREM. The REM AHI was 2.6 /hour versus a non-REM AHI of 22.8 /hour. REM sleep was achieved on a pressure of  cm/h2o (AHI was  .) The patient spent 64% of total sleep time in the supine position. The supine AHI was 7.8 /hour, versus a non-supine AHI of 30.9/hour.  OXYGEN SATURATION & C02:  The wake baseline 02 saturation was 96%, with the lowest being 87%. Time spent below 89% saturation equaled 2 minutes.  PERIODIC LIMB MOVEMENTS: The patient had a total of 7 Periodic Limb Movements. The Periodic Limb Movement (PLM) index was 1.9/hour and the PLM Arousal index was .8 /hour.  Post-study, the patient indicated that sleep was better than usual.  POLYSOMNOGRAPHY IMPRESSION :   1. Obstructive Sleep Apnea (OSA)  2. Periodic Limb Movement Disorder (PLMD)    RECOMMENDATIONS:  1. This patient has severe obstructive sleep apnea and responded well on CPAP therapy. I will, therefore, start the patient on home CPAP treatment at a pressure of 12 cm via small FFM, with heated humidity. The patient should be reminded to be fully compliant with PAP therapy to improve sleep related symptoms and decrease long term cardiovascular risks. Please note that untreated obstructive  sleep apnea may carry additional perioperative morbidity. Patients with significant obstructive sleep apnea should receive perioperative PAP therapy and the surgeons and particularly the anesthesiologist should be informed of the diagnosis and the severity of the sleep disordered breathing. 2. Moderate PLMs (periodic limb movements of sleep) were noted during the baseline portion of the study only, associated with minimal arousals; clinical correlation is recommended. Medication effect from the antidepressant medication should be considered.  3. The patient should be cautioned not to drive, work at heights, or operate dangerous or heavy equipment when tired or sleepy. Review and reiteration of good sleep hygiene measures should be pursued with any patient. 4. The patient will be seen in follow-up in the sleep clinic at [facility] for discussion of the test results, symptom and treatment compliance review, further management strategies, etc. The referring provider will be notified of the test results.  I certify that I have reviewed the entire raw data recording prior to the issuance of  this report in accordance with the Standards of Accreditation of the American Academy of Sleep Medicine (AASM)   Huston Foley, MD, PhD Diplomat, American Board of Neurology and Sleep Medicine (Neurology and Sleep Medicine)

## 2018-06-03 NOTE — Progress Notes (Signed)
Patient referred by Dr. Patsy Lageropland, seen by me on 05/17/18, split night sleep study on 05/30/18. Please call and notify patient that the recent sleep study confirmed the diagnosis of severe OSA. She did well with CPAP during the study with significant improvement of the respiratory events. Therefore, I would like start the patient on CPAP therapy at home by prescribing a machine for home use. I placed the order in the chart.  Please advise patient that we need a follow up appointment with either myself or one of our nurse practitioners in about 10 weeks post set-up to check for how the patient is feeling and how well the patient is using the machine, etc. Please go ahead and schedule the appointment, while you have the patient on the phone and make sure patient understands the importance of keeping this window for the FU appointment, as it is often an insurance requirement. Failing to adhere to this may result in losing coverage for sleep apnea treatment, at which point most patients are left with a choice of returning the machine or paying out of pocket (and we want neither of this to happen!).  Please re-enforce the importance of compliance with treatment and the need for us to monitor compliance data - again an insurance requirement and usually a good feedback for the patient as far as how they are doing.  Also remind patient, that any PAP machine or mask issues should be first addressed with the DME company, who provided the machine/mask.  Please ask if patient has a preference regarding DME company, may depend on the insurance too.  Please arrange for CPAP set up at home through a DME company of patient's choice.  Once you have spoken to the patient you can close the phone encounter. Please fax/route report to referring provider, thanks,   Huston FoleySaima Maricsa Sammons, MD, PhD [facility]

## 2018-06-03 NOTE — Addendum Note (Signed)
Addended by: Huston FoleyATHAR, Earvin Blazier on: 06/03/2018 07:43 AM   Modules accepted: Orders

## 2018-06-03 NOTE — Telephone Encounter (Signed)
I called pt. I advised pt that Dr. Frances FurbishAthar reviewed their sleep study results and found that pt has severe sleep apnea. Dr. Frances FurbishAthar recommends that pt starts CPAP at a pressure of 12 cm water pressure. I reviewed PAP compliance expectations with the pt. Pt is agreeable to starting a CPAP. I advised pt that an order will be sent to a DME, [facility], and [facility] will call the pt within about one week after they file with the pt's insurance. [facility] will show the pt how to use the machine, fit for masks, and troubleshoot the CPAP if needed. A follow up appt was made for insurance purposes with Dr. Frances FurbishAthar on Oct 9,2019 at 2:00 pm . Pt verbalized understanding to arrive 15 minutes early and bring their CPAP. A letter with all of this information in it will be mailed to the pt as a reminder. I verified with the pt that the address we have on file is correct. Pt verbalized understanding of results. Pt had no questions at this time but was encouraged to call back if questions arise.

## 2018-07-07 ENCOUNTER — Ambulatory Visit (INDEPENDENT_AMBULATORY_CARE_PROVIDER_SITE_OTHER): Payer: BLUE CROSS/BLUE SHIELD | Admitting: Women's Health

## 2018-07-07 ENCOUNTER — Encounter: Payer: Self-pay | Admitting: Women's Health

## 2018-07-07 VITALS — BP 148/72 | Ht 63.5 in | Wt 270.2 lb

## 2018-07-07 DIAGNOSIS — Z01419 Encounter for gynecological examination (general) (routine) without abnormal findings: Secondary | ICD-10-CM

## 2018-07-07 DIAGNOSIS — N912 Amenorrhea, unspecified: Secondary | ICD-10-CM | POA: Diagnosis not present

## 2018-07-07 DIAGNOSIS — Z3041 Encounter for surveillance of contraceptive pills: Secondary | ICD-10-CM | POA: Diagnosis not present

## 2018-07-07 DIAGNOSIS — Z23 Encounter for immunization: Secondary | ICD-10-CM

## 2018-07-07 MED ORDER — NORETHINDRONE 0.35 MG PO TABS: 1.0000 | ORAL_TABLET | Freq: Every day | ORAL | 4 refills | Status: DC

## 2018-07-07 NOTE — Patient Instructions (Signed)
Health Maintenance, Female Adopting a healthy lifestyle and getting preventive care can go a long way to promote health and wellness. Talk with your health care provider about what schedule of regular examinations is right for you. This is a good chance for you to check in with your provider about disease prevention and staying healthy. In between checkups, there are plenty of things you can do on your own. Experts have done a lot of research about which lifestyle changes and preventive measures are most likely to keep you healthy. Ask your health care provider for more information. Weight and diet Eat a healthy diet  Be sure to include plenty of vegetables, fruits, low-fat dairy products, and lean protein.  Do not eat a lot of foods high in solid fats, added sugars, or salt.  Get regular exercise. This is one of the most important things you can do for your health. ? Most adults should exercise for at least 150 minutes each week. The exercise should increase your heart rate and make you sweat (moderate-intensity exercise). ? Most adults should also do strengthening exercises at least twice a week. This is in addition to the moderate-intensity exercise.  Maintain a healthy weight  Body mass index (BMI) is a measurement that can be used to identify possible weight problems. It estimates body fat based on height and weight. Your health care provider can help determine your BMI and help you achieve or maintain a healthy weight.  For females 20 years of age and older: ? A BMI below 18.5 is considered underweight. ? A BMI of 18.5 to 24.9 is normal. ? A BMI of 25 to 29.9 is considered overweight. ? A BMI of 30 and above is considered obese.  Watch levels of cholesterol and blood lipids  You should start having your blood tested for lipids and cholesterol at 53 years of age, then have this test every 5 years.  You may need to have your cholesterol levels checked more often if: ? Your lipid or  cholesterol levels are high. ? You are older than 53 years of age. ? You are at high risk for heart disease.  Cancer screening Lung Cancer  Lung cancer screening is recommended for adults 55-80 years old who are at high risk for lung cancer because of a history of smoking.  A yearly low-dose CT scan of the lungs is recommended for people who: ? Currently smoke. ? Have quit within the past 15 years. ? Have at least a 30-pack-year history of smoking. A pack year is smoking an average of one pack of cigarettes a day for 1 year.  Yearly screening should continue until it has been 15 years since you quit.  Yearly screening should stop if you develop a health problem that would prevent you from having lung cancer treatment.  Breast Cancer  Practice breast self-awareness. This means understanding how your breasts normally appear and feel.  It also means doing regular breast self-exams. Let your health care provider know about any changes, no matter how small.  If you are in your 20s or 30s, you should have a clinical breast exam (CBE) by a health care provider every 1-3 years as part of a regular health exam.  If you are 40 or older, have a CBE every year. Also consider having a breast X-ray (mammogram) every year.  If you have a family history of breast cancer, talk to your health care provider about genetic screening.  If you are at high risk   for breast cancer, talk to your health care provider about having an MRI and a mammogram every year.  Breast cancer gene (BRCA) assessment is recommended for women who have family members with BRCA-related cancers. BRCA-related cancers include: ? Breast. ? Ovarian. ? Tubal. ? Peritoneal cancers.  Results of the assessment will determine the need for genetic counseling and BRCA1 and BRCA2 testing.  Cervical Cancer Your health care provider may recommend that you be screened regularly for cancer of the pelvic organs (ovaries, uterus, and  vagina). This screening involves a pelvic examination, including checking for microscopic changes to the surface of your cervix (Pap test). You may be encouraged to have this screening done every 3 years, beginning at age 22.  For women ages 56-65, health care providers may recommend pelvic exams and Pap testing every 3 years, or they may recommend the Pap and pelvic exam, combined with testing for human papilloma virus (HPV), every 5 years. Some types of HPV increase your risk of cervical cancer. Testing for HPV may also be done on women of any age with unclear Pap test results.  Other health care providers may not recommend any screening for nonpregnant women who are considered low risk for pelvic cancer and who do not have symptoms. Ask your health care provider if a screening pelvic exam is right for you.  If you have had past treatment for cervical cancer or a condition that could lead to cancer, you need Pap tests and screening for cancer for at least 20 years after your treatment. If Pap tests have been discontinued, your risk factors (such as having a new sexual partner) need to be reassessed to determine if screening should resume. Some women have medical problems that increase the chance of getting cervical cancer. In these cases, your health care provider may recommend more frequent screening and Pap tests.  Colorectal Cancer  This type of cancer can be detected and often prevented.  Routine colorectal cancer screening usually begins at 54 years of age and continues through 53 years of age.  Your health care provider may recommend screening at an earlier age if you have risk factors for colon cancer.  Your health care provider may also recommend using home test kits to check for hidden blood in the stool.  A small camera at the end of a tube can be used to examine your colon directly (sigmoidoscopy or colonoscopy). This is done to check for the earliest forms of colorectal  cancer.  Routine screening usually begins at age 33.  Direct examination of the colon should be repeated every 5-10 years through 53 years of age. However, you may need to be screened more often if early forms of precancerous polyps or small growths are found.  Skin Cancer  Check your skin from head to toe regularly.  Tell your health care provider about any new moles or changes in moles, especially if there is a change in a mole's shape or color.  Also tell your health care provider if you have a mole that is larger than the size of a pencil eraser.  Always use sunscreen. Apply sunscreen liberally and repeatedly throughout the day.  Protect yourself by wearing long sleeves, pants, a wide-brimmed hat, and sunglasses whenever you are outside.  Heart disease, diabetes, and high blood pressure  High blood pressure causes heart disease and increases the risk of stroke. High blood pressure is more likely to develop in: ? People who have blood pressure in the high end of  the normal range (130-139/85-89 mm Hg). ? People who are overweight or obese. ? People who are African American.  If you are 21-29 years of age, have your blood pressure checked every 3-5 years. If you are 3 years of age or older, have your blood pressure checked every year. You should have your blood pressure measured twice-once when you are at a hospital or clinic, and once when you are not at a hospital or clinic. Record the average of the two measurements. To check your blood pressure when you are not at a hospital or clinic, you can use: ? An automated blood pressure machine at a pharmacy. ? A home blood pressure monitor.  If you are between 17 years and 37 years old, ask your health care provider if you should take aspirin to prevent strokes.  Have regular diabetes screenings. This involves taking a blood sample to check your fasting blood sugar level. ? If you are at a normal weight and have a low risk for diabetes,  have this test once every three years after 53 years of age. ? If you are overweight and have a high risk for diabetes, consider being tested at a younger age or more often. Preventing infection Hepatitis B  If you have a higher risk for hepatitis B, you should be screened for this virus. You are considered at high risk for hepatitis B if: ? You were born in a country where hepatitis B is common. Ask your health care provider which countries are considered high risk. ? Your parents were born in a high-risk country, and you have not been immunized against hepatitis B (hepatitis B vaccine). ? You have HIV or AIDS. ? You use needles to inject street drugs. ? You live with someone who has hepatitis B. ? You have had sex with someone who has hepatitis B. ? You get hemodialysis treatment. ? You take certain medicines for conditions, including cancer, organ transplantation, and autoimmune conditions.  Hepatitis C  Blood testing is recommended for: ? Everyone born from [birth year] through 1966. ? Anyone with known risk factors for hepatitis C.  Sexually transmitted infections (STIs)  You should be screened for sexually transmitted infections (STIs) including gonorrhea and chlamydia if: ? You are sexually active and are younger than 53 years of age. ? You are older than 53 years of age and your health care provider tells you that you are at risk for this type of infection. ? Your sexual activity has changed since you were last screened and you are at an increased risk for chlamydia or gonorrhea. Ask your health care provider if you are at risk.  If you do not have HIV, but are at risk, it may be recommended that you take a prescription medicine daily to prevent HIV infection. This is called pre-exposure prophylaxis (PrEP). You are considered at risk if: ? You are sexually active and do not regularly use condoms or know the HIV status of your partner(s). ? You take drugs by injection. ? You are  sexually active with a partner who has HIV.  Talk with your health care provider about whether you are at high risk of being infected with HIV. If you choose to begin PrEP, you should first be tested for HIV. You should then be tested every 3 months for as long as you are taking PrEP. Pregnancy  If you are premenopausal and you may become pregnant, ask your health care provider about preconception counseling.  If you may become  pregnant, take 400 to 800 micrograms (mcg) of folic acid every day.  If you want to prevent pregnancy, talk to your health care provider about birth control (contraception). Osteoporosis and menopause  Osteoporosis is a disease in which the bones lose minerals and strength with aging. This can result in serious bone fractures. Your risk for osteoporosis can be identified using a bone density scan.  If you are 65 years of age or older, or if you are at risk for osteoporosis and fractures, ask your health care provider if you should be screened.  Ask your health care provider whether you should take a calcium or vitamin D supplement to lower your risk for osteoporosis.  Menopause may have certain physical symptoms and risks.  Hormone replacement therapy may reduce some of these symptoms and risks. Talk to your health care provider about whether hormone replacement therapy is right for you. Follow these instructions at home:  Schedule regular health, dental, and eye exams.  Stay current with your immunizations.  Do not use any tobacco products including cigarettes, chewing tobacco, or electronic cigarettes.  If you are pregnant, do not drink alcohol.  If you are breastfeeding, limit how much and how often you drink alcohol.  Limit alcohol intake to no more than 1 drink per day for nonpregnant women. One drink equals 12 ounces of beer, 5 ounces of wine, or 1 ounces of hard liquor.  Do not use street drugs.  Do not share needles.  Ask your health care  provider for help if you need support or information about quitting drugs.  Tell your health care provider if you often feel depressed.  Tell your health care provider if you have ever been abused or do not feel safe at home. This information is not intended to replace advice given to you by your health care provider. Make sure you discuss any questions you have with your health care provider. Document Released: 04/28/2011 Document Revised: 03/20/2016 Document Reviewed: 07/17/2015 Elsevier Interactive Patient Education  2018 [employer] Carbohydrate Counting for Diabetes Mellitus, Adult Carbohydrate counting is a method for keeping track of how many carbohydrates you eat. Eating carbohydrates naturally increases the amount of sugar (glucose) in the blood. Counting how many carbohydrates you eat helps keep your blood glucose within normal limits, which helps you manage your diabetes (diabetes mellitus). It is important to know how many carbohydrates you can safely have in each meal. This is different for every person. A diet and nutrition specialist (registered dietitian) can help you make a meal plan and calculate how many carbohydrates you should have at each meal and snack. Carbohydrates are found in the following foods:  Grains, such as breads and cereals.  Dried beans and soy products.  Starchy vegetables, such as potatoes, peas, and corn.  Fruit and fruit juices.  Milk and yogurt.  Sweets and snack foods, such as cake, cookies, candy, chips, and soft drinks.  How do I count carbohydrates? There are two ways to count carbohydrates in food. You can use either of the methods or a combination of both. Reading "Nutrition Facts" on packaged food The "Nutrition Facts" list is included on the labels of almost all packaged foods and beverages in the [place] It includes:  The serving size.  Information about nutrients in each serving, including the grams (g) of carbohydrate per  serving.  To use the "Nutrition Facts":  Decide how many servings you will have.  Multiply the number of servings by the number of carbohydrates   per serving.  The resulting number is the total amount of carbohydrates that you will be having.  Learning standard serving sizes of other foods When you eat foods containing carbohydrates that are not packaged or do not include "Nutrition Facts" on the label, you need to measure the servings in order to count the amount of carbohydrates:  Measure the foods that you will eat with a food scale or measuring cup, if needed.  Decide how many standard-size servings you will eat.  Multiply the number of servings by 15. Most carbohydrate-rich foods have about 15 g of carbohydrates per serving. ? For example, if you eat 8 oz (170 g) of strawberries, you will have eaten 2 servings and 30 g of carbohydrates (2 servings x 15 g = 30 g).  For foods that have more than one food mixed, such as soups and casseroles, you must count the carbohydrates in each food that is included.  The following list contains standard serving sizes of common carbohydrate-rich foods. Each of these servings has about 15 g of carbohydrates:   hamburger bun or  English muffin.   oz (15 mL) syrup.   oz (14 g) jelly.  1 slice of bread.  1 six-inch tortilla.  3 oz (85 g) cooked rice or pasta.  4 oz (113 g) cooked dried beans.  4 oz (113 g) starchy vegetable, such as peas, corn, or potatoes.  4 oz (113 g) hot cereal.  4 oz (113 g) mashed potatoes or  of a large baked potato.  4 oz (113 g) canned or frozen fruit.  4 oz (120 mL) fruit juice.  4-6 crackers.  6 chicken nuggets.  6 oz (170 g) unsweetened dry cereal.  6 oz (170 g) plain fat-free yogurt or yogurt sweetened with artificial sweeteners.  8 oz (240 mL) milk.  8 oz (170 g) fresh fruit or one small piece of fruit.  24 oz (680 g) popped popcorn.  Example of carbohydrate counting Sample meal  3  oz (85 g) chicken breast.  6 oz (170 g) brown rice.  4 oz (113 g) corn.  8 oz (240 mL) milk.  8 oz (170 g) strawberries with sugar-free whipped topping. Carbohydrate calculation 1. Identify the foods that contain carbohydrates: ? Rice. ? Corn. ? Milk. ? Strawberries. 2. Calculate how many servings you have of each food: ? 2 servings rice. ? 1 serving corn. ? 1 serving milk. ? 1 serving strawberries. 3. Multiply each number of servings by 15 g: ? 2 servings rice x 15 g = 30 g. ? 1 serving corn x 15 g = 15 g. ? 1 serving milk x 15 g = 15 g. ? 1 serving strawberries x 15 g = 15 g. 4. Add together all of the amounts to find the total grams of carbohydrates eaten: ? 30 g + 15 g + 15 g + 15 g = 75 g of carbohydrates total. This information is not intended to replace advice given to you by your health care provider. Make sure you discuss any questions you have with your health care provider. Document Released: 10/13/2005 Document Revised: 05/02/2016 Document Reviewed: 03/26/2016 Elsevier Interactive Patient Education  [employer].

## 2018-07-07 NOTE — Addendum Note (Signed)
Addended by: Becky Sax on: 07/07/2018 02:45 PM   Modules accepted: Orders

## 2018-07-07 NOTE — Progress Notes (Signed)
Ann Walter 03/20/1973 [id number]    History:    Presents for annual exam.  Amenorrheic on Micronor with occasional hot flushes.  Normal Pap and mammogram history.  2017- colonoscopy.  Primary care manages hypertension, diabetes and pression/anxiety.  Has been off the Micronor 1 week we will check [facility].  2013 severe depression, struggles with depression today.  Benign hematuria.  Has gained 40 pounds in the past year.  Past medical history, past surgical history, family history and social history were all reviewed and documented in the EPIC chart.  Works at [employer].  Has 3 Multimedia programmer.  Brother recently passed away from kidney failure and heart disease.  ROS:  A ROS was performed and pertinent positives and negatives are included.  Exam:  Vitals:   07/07/18 1211  BP: (!) 148/72  Weight: 270 lb 3.2 oz (122.6 kg)  Height: 5' 3.5" (1.613 m)   Body mass index is 47.11 kg/m.   General appearance:  Normal Thyroid:  Symmetrical, normal in size, without palpable masses or nodularity. Respiratory  Auscultation:  Clear without wheezing or rhonchi Cardiovascular  Auscultation:  Regular rate, without rubs, murmurs or gallops  Edema/varicosities:  Not grossly evident Abdominal  Soft,nontender, without masses, guarding or rebound.  Liver/spleen:  No organomegaly noted  Hernia:  None appreciated  Skin  Inspection:  Grossly normal   Breasts: Examined lying and sitting.     Right: Without masses, retractions, discharge or axillary adenopathy.     Left: Without masses, retractions, discharge or axillary adenopathy. Gentitourinary   Inguinal/mons:  Normal without inguinal adenopathy  External genitalia:  Normal  BUS/Urethra/Skene's glands:  Normal  Vagina:  Normal  Cervix:  Normal  Uterus:   normal in size, shape and contour.  Midline and mobile  Adnexa/parametria:     Rt: Without masses or tenderness.   Lt: Without masses or tenderness.  Anus and perineum: Normal  Digital  rectal exam: Normal sphincter tone without palpated masses or tenderness  Assessment/Plan:  52 y.o. MWF G1, P0 for annual exam with no complaints.  Amenorrheic on Micronor Obesity Anxiety/depression, diabetes, hypertension-primary care manages labs and meds  Plan: Reviewed blood pressure elevated, states does check at home and is normally less than 130/80.  FSH, Pap with HR HPV typing, new screening guidelines reviewed.  SBE's, continue annual screening mammogram, calcium rich foods, vitamin D 1000 daily encouraged.  Reviewed importance of increased exercise, decrease calorie/carbs.  Reviewed importance of self-care, leisure activities.  Micronor 1 p.o. daily prescription, proper use given and reviewed.  Will triage based on [facility] results.  Flu shot given today    Harrington Challenger [facility], 12:45 PM 07/07/2018

## 2018-07-08 LAB — PAP, TP IMAGING W/ HPV RNA, RFLX HPV TYPE 16,18/45: HPV DNA HIGH RISK: NOT DETECTED

## 2018-07-08 LAB — FOLLICLE STIMULATING HORMONE: FSH: 36.4 m[IU]/mL

## 2018-08-02 ENCOUNTER — Encounter: Payer: Self-pay | Admitting: Neurology

## 2018-08-04 ENCOUNTER — Ambulatory Visit (INDEPENDENT_AMBULATORY_CARE_PROVIDER_SITE_OTHER): Payer: BLUE CROSS/BLUE SHIELD | Admitting: Neurology

## 2018-08-04 ENCOUNTER — Encounter: Payer: Self-pay | Admitting: Neurology

## 2018-08-04 VITALS — BP 134/76 | HR 68 | Ht 63.5 in | Wt 269.0 lb

## 2018-08-04 DIAGNOSIS — G4733 Obstructive sleep apnea (adult) (pediatric): Secondary | ICD-10-CM

## 2018-08-04 DIAGNOSIS — Z9989 Dependence on other enabling machines and devices: Secondary | ICD-10-CM

## 2018-08-04 NOTE — Progress Notes (Signed)
Subjective:    Patient ID: Ann Walter is a 52 y.o. female.  HPI     Interim history:   Ann Walter is a 52 year old right-handed woman with an underlying medical history of anxiety, prediabetes, palpitations, depression, hypertension, allergies, and morbid obesity with a BMI of over 86, who presents for follow-up consultation of her obstructive sleep apnea, after sleep study testing and starting CPAP therapy at home. The patient is unaccompanied today. I first met her on 05/17/2018 at the request of her primary care physician, at which time she reported snoring and daytime somnolence, as well as nocturia morning headaches. She was advised to proceed with a sleep study. She had a split-night sleep study on 05/30/2018. I went over her test results with her in detail today. Baseline sleep efficiency was 80%, sleep latency 22 minutes, REM sleep was absent and slow-wave sleep was absent. Total AHI was 59.5 per hour, average oxygen saturation 96%, nadir was 84% during non-REM sleep. She had moderate PLMS with mild arousals during the first part of the study. She was titrated on CPAP via full face mask from 5 cm to 12 cm. On the final pressure her AHI was 1.4 per hour with supine REM sleep achieved an O2 nadir of 92%. She had no significant PLMS during the second part of the study. Based on her sleep study results I prescribed CPAP therapy for home use at a pressure of 12 cm.  Today, 08/04/2018: I reviewed her CPAP compliance data from 07/04/2018 through 08/02/2018 which is a total of 30 days, during which time she used her CPAP 29 days with percent used days greater than 4 hours at 83%, indicating very good compliance with an average usage of 6 hours and 23 minutes, residual AHI at goal at 0.9 per hour, leak on the high side with the 95th percentile at 28.5 L/m on a pressure of 12 cm with EPR of 3. She reports that CPAP is going well overall, she does have some residual marks on her face from the mask in the  mornings. She feels better, sleep is better quality. Less tired. Has had some stressor in the past, but dealing with them. Motivated to continue with the CPAP.   The patient's allergies, current medications, family history, past medical history, past social history, past surgical history and problem list were reviewed and updated as appropriate.   Previously:  05/17/2018: (She) reports snoring and excessive daytime somnolence. I reviewed your office note from 03/25/2018, which you kindly included. Her Epworth sleepiness score is 10 out of 24 today, fatigue score is 20 out of 63. She is married and lives with her husband, they have no children. She works at [employer]. She is a nonsmoker and drinks alcohol occasionally, drinks caffeine in the form of [facility]. She has gained weight in the past few years. She lost her mother about 4 years ago and recently lost her brother at age 68. Her bedtime and rise time vary. Sleep is interrupted by nocturia which is on average twice per night. She also has a puppy that wakes her up early morning. She has woken up with a headache at times. Her mother and her brother both had obstructive sleep apnea. Mother died at age 21.   Her Past Medical History Is Significant For: Past Medical History:  Diagnosis Date  . Allergy   . Benign hematuria 12/2009   WORKUP WITH DR. NESI WAS NEGATIVE  . Depression   . Diabetes mellitus without  complication (HCC)    prediabetes per pt  . Heart murmur   . HTN (hypertension)   . Obesity   . UTI (urinary tract infection), uncomplicated     Her Past Surgical History Is Significant For: Past Surgical History:  Procedure Laterality Date  . CHOLECYSTECTOMY  10/2010  . CYSTECTOMY     x 2 from chest    Her Family History Is Significant For: Family History  Problem Relation Age of Onset  . Diabetes Mother   . Heart disease Mother        HAD TRIPLE BYPASS  . Hypertension Mother   . Kidney disease Father   . Breast  cancer Sister   . Breast cancer Maternal Grandmother   . Cancer Maternal Grandmother        LYMPH NODE CANCER  . Cancer Maternal Aunt        COLORECTAL   . COPD Brother   . Kidney failure Brother   . Colon cancer Neg Hx     Her Social History Is Significant For: Social History   Socioeconomic History  . Marital status: Married    Spouse name: Not on file  . Number of children: 0  . Years of education: Not on file  . Highest education level: Not on file  Occupational History  . Occupation: Chief Operating Officer: [employer]  . Occupation: HAZARD/FLOOD    Employer: [employer]  Social Needs  . Financial resource strain: Not on file  . Food insecurity:    Worry: Not on file    Inability: Not on file  . Transportation needs:    Medical: Not on file    Non-medical: Not on file  Tobacco Use  . Smoking status: Never Smoker  . Smokeless tobacco: Never Used  Substance and Sexual Activity  . Alcohol use: Yes    Alcohol/week: 0.0 standard drinks    Comment: occasionally  . Drug use: No  . Sexual activity: Yes    Partners: Male    Birth control/protection: Pill    Comment: INTERCOURSE AGE 43, SEXUAL PARTNERS MORE THAN 5  Lifestyle  . Physical activity:    Days per week: Not on file    Minutes per session: Not on file  . Stress: Not on file  Relationships  . Social connections:    Talks on phone: Not on file    Gets together: Not on file    Attends religious service: Not on file    Active member of club or organization: Not on file    Attends meetings of clubs or organizations: Not on file    Relationship status: Not on file  Other Topics Concern  . Not on file  Social History Narrative   Married with no children   She worked with [employer] of [employer] for 15 years    Her Allergies Are:  Allergies  Allergen Reactions  . Wellbutrin [Bupropion]     Throat swelling   . Sulfonamide Derivatives     Unknown per pt  :   Her Current Medications Are:   Outpatient Encounter Medications as of 08/04/2018  Medication Sig  . ALPRAZolam (XANAX) 0.5 MG tablet Take 1 tablet (0.5 mg total) by mouth 3 (three) times daily as needed.  Marland Kitchen FLUoxetine (PROZAC) 20 MG tablet Take 3 tablets (60 mg total) by mouth daily.  . metFORMIN (GLUCOPHAGE) 500 MG tablet Take 1 tablet (500 mg total) by mouth daily.  . norethindrone (MICRONOR,CAMILA,ERRIN) 0.35 MG  tablet Take 1 tablet (0.35 mg total) by mouth daily.  Marland Kitchen telmisartan (MICARDIS) 80 MG tablet Take 1 tablet (80 mg total) by mouth daily.  . [DISCONTINUED] norethindrone-ethinyl estradiol (JUNEL FE,GILDESS FE,LOESTRIN FE) 1-20 MG-MCG tablet Take 1 tablet by mouth daily.   No facility-administered encounter medications on file as of 08/04/2018.   :  Review of Systems:  Out of a complete 14 point review of systems, all are reviewed and negative with the exception of these symptoms as listed below: Review of Systems  Neurological:       Pt presents today to discuss her cpap. The mask is causing marks on her face. Otherwise, cpap is going well.    Objective:  Neurological Exam  Physical Exam Physical Examination:   Vitals:   08/04/18 1400  BP: 134/76  Pulse: 68   General Examination: The patient is a very pleasant 52 y.o. female in no acute distress. She appears well-developed and well-nourished and well groomed.   HEENT: Normocephalic, atraumatic, pupils are equal, round and reactive to light and accommodation. Extraocular tracking is good without limitation to gaze excursion or nystagmus noted. Normal smooth pursuit is noted. Hearing is grossly intact. Face is symmetric with normal facial animation and normal facial sensation. Speech is clear with no dysarthria noted. There is no hypophonia. There is no lip, neck/head, jaw or voice tremor. Neck is supple with full range of passive and active motion. There are no carotid bruits on auscultation. Oropharynx exam reveals: mildl mouth dryness, adequate dental  hygiene and moderate airway crowding. Tongue protrudes centrally and palate elevates symmetrically.   Chest: Clear to auscultation without wheezing, rhonchi or crackles noted.  Heart: S1+S2+0, regular and normal without murmurs, rubs or gallops noted.   Abdomen: Soft, non-tender and non-distended with normal bowel sounds appreciated on auscultation.  Extremities: There is trace pitting edema in the right ankle.   Skin: Warm and dry without trophic changes noted.  Musculoskeletal: exam reveals no obvious joint deformities, tenderness or joint swelling or erythema.   Neurologically:  Mental status: The patient is awake, alert and oriented in all 4 spheres. Her immediate and remote memory, attention, language skills and fund of knowledge are appropriate. There is no evidence of aphasia, agnosia, apraxia or anomia. Speech is clear with normal prosody and enunciation. Thought process is linear. Mood is normal and affect is normal.  Cranial nerves II - XII are as described above under HEENT exam.  Motor exam: Normal bulk, strength and tone is noted. There is no drift, tremor or rebound. Romberg is negative. Fine motor skills and coordination: intact grossly.  Cerebellar testing: No dysmetria or intention tremor. There is no truncal or gait ataxia.  Sensory exam: intact to light touch in the upper and lower extremities.  Gait, station and balance: She stands easily. No veering to one side is noted. No leaning to one side is noted. Posture is age-appropriate and stance is narrow based. Gait shows normal stride length and normal pace.   Assessment and Plan:   In summary, Ann Walter is a very pleasant 52 year old female with an underlying medical history of anxiety, prediabetes, palpitations, depression, hypertension, allergies, and morbid obesity, who presents for follow up consultation of her severe sleep apnea, as determined by her split-night sleep study on 05/30/2018. Baseline AHI was  59.5 per hour, O2 nadir of 84% but absence of REM sleep prior to CPAP titration. She has established treatment on on CPAP of 12 cm with excellent compliance. She  feels improved in her sleep-related symptoms and is motivated to continue with treatment. She is highly commended for treatment adherence and encouraged to continue with full compliance. She is using a full face mask due to mouth venting, has had some marks on her face from them face mask and headgear but is not particularly bothered by it. Physical exam is stable. We talked about her split-night sleep study results in detail and also reviewed her compliance data together. She is advised to follow-up routinely in 5 or 6 months, she can see one of our nurse practitioners. She can follow-up yearly after that hopefully. We talked about the importance of weight loss. She is working on it. I answered all her questions today and she was in agreement. I spent 25 minutes in total face-to-face time with the patient, more than 50% of which was spent in counseling and coordination of care, reviewing test results, reviewing medication and discussing or reviewing the diagnosis of OSA, its prognosis and treatment options. Pertinent laboratory and imaging test results that were available during this visit with the patient were reviewed by me and considered in my medical decision making (see chart for details).

## 2018-08-04 NOTE — Patient Instructions (Addendum)
Please continue using your CPAP regularly. While your insurance requires that you use CPAP at least 4 hours each night on 70% of the nights, I recommend, that you not skip any nights and use it throughout the night if you can. Getting used to CPAP and staying with the treatment long term does take time and patience and discipline. Untreated obstructive sleep apnea when it is moderate to severe can have an adverse impact on cardiovascular health and raise her risk for heart disease, arrhythmias, hypertension, congestive heart failure, stroke and diabetes. Untreated obstructive sleep apnea causes sleep disruption, nonrestorative sleep, and sleep deprivation. This can have an impact on your day to day functioning and cause daytime sleepiness and impairment of cognitive function, memory loss, mood disturbance, and problems focussing. Using CPAP regularly can improve these symptoms.  Keep up the good work! We can see you in 6 months, you can see one of our nurse practitioners as you are stable.    

## 2018-08-07 ENCOUNTER — Ambulatory Visit (HOSPITAL_COMMUNITY)
Admission: EM | Admit: 2018-08-07 | Discharge: 2018-08-07 | Disposition: A | Discharge status: Home / Self Care | Payer: BLUE CROSS/BLUE SHIELD | Attending: Physician Assistant | Admitting: Physician Assistant

## 2018-08-07 ENCOUNTER — Encounter (HOSPITAL_COMMUNITY): Payer: Self-pay

## 2018-08-07 ENCOUNTER — Other Ambulatory Visit: Payer: Self-pay

## 2018-08-07 DIAGNOSIS — S83422A Sprain of lateral collateral ligament of left knee, initial encounter: Secondary | ICD-10-CM

## 2018-08-07 MED ORDER — NAPROXEN 500 MG PO TABS: 500.0000 mg | ORAL_TABLET | Freq: Two times a day (BID) | ORAL | 0 refills | Status: AC

## 2018-08-07 NOTE — ED Provider Notes (Signed)
08/07/2018 2:02 PM   DOB: [birth year] / MRN: [id number]  SUBJECTIVE:  Ann Walter is a 52 y.o. female presenting for left knee pain that started 1 day ago.  Assoicates pain with ambulation.  Denies bruising and swelling. Marland Kitchen  Has tried OTC analgesic. Hurt the knee intitially after kneeling down for a picnic.   She is allergic to wellbutrin [bupropion] and sulfonamide derivatives.   She  has a past medical history of Allergy, Benign hematuria (12/2009), Depression, Diabetes mellitus without complication (HCC), Heart murmur, HTN (hypertension), Obesity, and UTI (urinary tract infection), uncomplicated.    She  reports that she has never smoked. She has never used smokeless tobacco. She reports that she drinks alcohol. She reports that she does not use drugs. She  reports that she currently engages in sexual activity and has had partner(s) who are Female. She reports using the following method of birth control/protection: Pill. The patient  has a past surgical history that includes Cholecystectomy (10/2010) and Cystectomy.  Her family history includes Breast cancer in her maternal grandmother and sister; COPD in her brother; Cancer in her maternal aunt and maternal grandmother; Diabetes in her mother; Heart disease in her mother; Hypertension in her mother; Kidney disease in her father; Kidney failure in her brother.  Review of Systems  Constitutional: Negative for chills, diaphoresis and fever.  Eyes: Negative.   Respiratory: Negative for cough, hemoptysis, sputum production, shortness of breath and wheezing.   Cardiovascular: Negative for chest pain.  Gastrointestinal: Negative for nausea.  Skin: Negative for rash.  Neurological: Negative for dizziness, sensory change, speech change, focal weakness and headaches.    OBJECTIVE:  BP (!) 141/86 (BP Location: Left Arm)   Pulse 72   Temp 98.1 F (36.7 C) (Oral)   Resp 17   SpO2 98%   Wt Readings from Last 3 Encounters:  08/04/18 269 lb (122  kg)  07/07/18 270 lb 3.2 oz (122.6 kg)  05/17/18 268 lb 5 oz (121.7 kg)   Temp Readings from Last 3 Encounters:  08/07/18 98.1 F (36.7 C) (Oral)  03/25/18 98.4 F (36.9 C) (Oral)  03/25/18 98.5 F (36.9 C)   BP Readings from Last 3 Encounters:  08/07/18 (!) 141/86  08/04/18 134/76  07/07/18 (!) 148/72   Pulse Readings from Last 3 Encounters:  08/07/18 72  08/04/18 68  05/17/18 74    Physical Exam  Constitutional: She is oriented to person, place, and time. She appears well-nourished. No distress.  Eyes: Pupils are equal, round, and reactive to light. EOM are normal.  Cardiovascular: Normal rate.  Pulmonary/Chest: Effort normal.  Abdominal: She exhibits no distension.  Musculoskeletal: She exhibits tenderness (left knee exam shows ligament intact globally.  TTP most around the LCL. ). She exhibits no edema or deformity.  Neurological: She is alert and oriented to person, place, and time. No cranial nerve deficit. Gait normal.  Skin: Skin is dry. She is not diaphoretic.  Psychiatric: She has a normal mood and affect.  Vitals reviewed.   No results found for this or any previous visit (from the past 72 hour(s)).  No results found.  ASSESSMENT AND PLAN:   Sprain of lateral collateral ligament of left knee, initial encounter - Knee immobilizer and NSAID along with time.  RTC from imaging if not improving.    Discharge Instructions     Ice the knee.  Keep it wrapped for the next few days.         The patient is advised  to call or return to clinic if she does not see an improvement in symptoms, or to seek the care of the closest emergency department if she worsens with the above plan.   Deliah Boston, MHS, PA-C 08/07/2018 2:02 PM   Ofilia Neas, PA-C 08/07/18 1402

## 2018-08-07 NOTE — Discharge Instructions (Signed)
Ice the knee.  Keep it wrapped for the next few days.

## 2018-08-07 NOTE — ED Triage Notes (Signed)
Pt presents to [facility] for injury to left knee while bathing dog, pt states she heard a popping or snap when she twisted knee.

## 2018-08-09 ENCOUNTER — Other Ambulatory Visit: Payer: Self-pay | Admitting: Family Medicine

## 2018-08-09 DIAGNOSIS — F411 Generalized anxiety disorder: Secondary | ICD-10-CM

## 2018-08-10 ENCOUNTER — Encounter: Payer: Self-pay | Admitting: Family Medicine

## 2018-08-10 NOTE — Telephone Encounter (Signed)
Requesting:Alprazolam Contract:12/28/17 UDS:04/27/17 Last Visit:03/25/18 Next Visit:none Last Refill:05/12/18  Please Advise

## 2018-08-14 NOTE — Progress Notes (Signed)
[facility] [address] [place], [place] [address] [phone]  Date:  08/18/2018   Name:  Ann Walter   DOB:  1966-02-06   MRN:  [id number]  PCP:  Pearline Cables, MD    Chief Complaint: Depression (crying, does not want to live anymore-no SI, has no one to talk to, depression) and Knee Pain (left knee pain, seen at urgent care, dx with sprain, given knee brace, no help,trouble walking)   History of Present Illness:  Ann Walter is a 52 y.o. very pleasant female patient who presents with the following:  Short term follow-up visit today.  States that she is here for "same old same old."   She states that she does not plan to kill herself but she "wishes that I would die"  She notes that work is "still crazy" and expresses discontent with most aspects of her life, from her weight to work to family and personal relationships She had a friend who she would confide in but she got addicted to pain medication and is no longer a confidant Asked about if she could talk to her husband, she dismisses this idea  Her brother died in 05-27-23- she states that she feels very alone and is crying in room again today  Pt states that "my self esteem has sucked all my life, cause I was fat" She denies any history of trauma or abuse Detailed discussion with pt today.  Advised her to go to ER so that [facility] can be contacted and she can discuss her feelings with a mental health professional today . She declines to do so. Asked several times and she states that she would "never kill myself," due to her fears that she would then go to hell and not be able to see her deceased parents again. As she declines any intention or risk of self harm I cannot have her involuntary committed   She recently went to an UC for LEFT knee pain, was given a knee brace and naproxen However the knee is not getting better  She feels like the knee pain started after  she tried to rise up from sitting on the ground and felt/ heard a pop in her knee   She is on 60 mg of prozac - has been taking this for a little over a year  She is also on xanax daily for anxiety  She has tried zoloft, wellbutrin in the past  She also was on risperdal and lamictal years ago per Dr. Lolly Mustache, but pt states that she does not really remember seeing him She is currently not seeing a counselor or psychiatrist   Last seen here by myself in May:  Front desk staff came back to get [place] as pt began sobbing in waiting room today.  She was brought back to room She notes abd pain for the last 2 days, worse today It seems to hurt in her right upper abdomen  This reminds her of when she had her gallbladder taken out in 2012.  However she had this pain intermittently in the years since her chole so she does not think that her gallbladder was really to blmae She has not really been able to eat the couple of days She wonders if her pain is due "to stress,"  Asked her about stressors; "everything, work is the worst, money, home. All of the above." She notes that work is her  main stressor.  She expresses hating her job and not wanting to work there any more Asked about self harm- "I ain't gonna kill myself but I wish I'd die."  She denies any specific plan for self harm  She has noted diarrhea for the last couple of days and is not able to eat much.  Has a few bites of a sandwich so far today  I sent her to the ER in May for further eval and she was released to home  History of depression, anxiety and HTN Mammo:  She is also going through menopause per her report/ GYN  She is taking OCP for one more year and then will stop     Lab Results  Component Value Date   HGBA1C 6.2 02/22/2018    BP Readings from Last 3 Encounters:  08/18/18 128/84  08/07/18 (!) 141/86  08/04/18 134/76   Wt Readings from Last 3 Encounters:  08/18/18 276 lb (125.2 kg)  08/04/18 269 lb (122 kg)  07/07/18  270 lb 3.2 oz (122.6 kg)   Continues to gain weight   Patient Active Problem List   Diagnosis Date Noted  . Right foot pain 02/04/2018  . Left shoulder pain 12/22/2017  . Upper airway cough syndrome 09/14/2017  . Pre-diabetes 11/22/2015  . Morbid obesity due to excess calories (HCC) 11/21/2015  . Ovarian cyst 02/28/2015  . Chest pain, atypical 03/08/2014  . Heart murmur 03/08/2014  . Heart palpitations 03/08/2014  . GAD (generalized anxiety disorder) 02/13/2014  . Depression   . Essential hypertension     Past Medical History:  Diagnosis Date  . Allergy   . Benign hematuria 12/2009   WORKUP WITH DR. NESI WAS NEGATIVE  . Depression   . Diabetes mellitus without complication (HCC)    prediabetes per pt  . Heart murmur   . HTN (hypertension)   . Obesity   . UTI (urinary tract infection), uncomplicated     Past Surgical History:  Procedure Laterality Date  . CHOLECYSTECTOMY  10/2010  . CYSTECTOMY     x 2 from chest    Social History   Tobacco Use  . Smoking status: Never Smoker  . Smokeless tobacco: Never Used  Substance Use Topics  . Alcohol use: Yes    Alcohol/week: 0.0 standard drinks    Comment: occasionally  . Drug use: No    Family History  Problem Relation Age of Onset  . Diabetes Mother   . Heart disease Mother        HAD TRIPLE BYPASS  . Hypertension Mother   . Kidney disease Father   . Breast cancer Sister   . Breast cancer Maternal Grandmother   . Cancer Maternal Grandmother        LYMPH NODE CANCER  . Cancer Maternal Aunt        COLORECTAL   . COPD Brother   . Kidney failure Brother   . Colon cancer Neg Hx     Allergies  Allergen Reactions  . Wellbutrin [Bupropion]     Throat swelling   . Sulfonamide Derivatives     Unknown per pt    Medication list has been reviewed and updated.  Current Outpatient Medications on File Prior to Visit  Medication Sig Dispense Refill  . ALPRAZolam (XANAX) 0.5 MG tablet TAKE 1 TABLET BY MOUTH  THREE TIMES DAILY AS NEEDED 90 tablet 2  . FLUoxetine (PROZAC) 20 MG tablet Take 3 tablets (60 mg total) by mouth daily. 90 tablet  11  . metFORMIN (GLUCOPHAGE) 500 MG tablet Take 1 tablet (500 mg total) by mouth daily. 90 tablet 3  . norethindrone (MICRONOR,CAMILA,ERRIN) 0.35 MG tablet Take 1 tablet (0.35 mg total) by mouth daily. 3 Package 4  . telmisartan (MICARDIS) 80 MG tablet Take 1 tablet (80 mg total) by mouth daily. 90 tablet 3   No current facility-administered medications on file prior to visit.     Review of Systems:  As per HPI- otherwise negative.   Physical Examination: Vitals:   08/18/18 1107  BP: 128/84  Pulse: 66  Resp: 18  Temp: 98.2 F (36.8 C)  SpO2: 99%   Vitals:   08/18/18 1107  Weight: 276 lb (125.2 kg)  Height: 5' 3.5" (1.613 m)   Body mass index is 48.12 kg/m. Ideal Body Weight: Weight in (lb) to have BMI = 25: 143.1  GEN: WDWN, NAD, Non-toxic, A & O x 3, obese, tearful in the office today  HEENT: Atraumatic, Normocephalic. Neck supple. No masses, No LAD. Ears and Nose: No external deformity. CV: RRR, No M/G/R. No JVD. No thrill. No extra heart sounds. PULM: CTA B, no wheezes, crackles, rhonchi. No retractions. No resp. distress. No accessory muscle use. EXTR: No c/c/e NEURO Normal gait.  PSYCH: Normally interactive. Conversant.  Pt is favoring left knee and has pain with ROM today    Assessment and Plan: Depression, unspecified depression type - Plan: desvenlafaxine (PRISTIQ) 50 MG 24 hr tablet  Chronic pain of left knee - Plan: Ambulatory referral to Orthopedic Surgery  GAD (generalized anxiety disorder) - Plan: desvenlafaxine (PRISTIQ) 50 MG 24 hr tablet  Dysthymia  Here today because I had asked her to come in for a recheck- she is on chronic xanax and was due for periodic visit Angelique Blonder has been generally dysthymic and very negative for the years that I have known her.  However, her sx are worse today. We decided to taper off prozac  and try pristiq for her.  I also really encouraged her to start counseling. She persistently denies any intention or risk of self harm  Asked her to see me in 3 weeks  Advised to seek emergency care if any risk of self harm Referral to ortho for her knee sx   Signed Abbe Amsterdam, MD

## 2018-08-18 ENCOUNTER — Other Ambulatory Visit: Payer: Self-pay | Admitting: Physician Assistant

## 2018-08-18 ENCOUNTER — Ambulatory Visit (INDEPENDENT_AMBULATORY_CARE_PROVIDER_SITE_OTHER): Payer: BLUE CROSS/BLUE SHIELD | Admitting: Family Medicine

## 2018-08-18 ENCOUNTER — Encounter: Payer: Self-pay | Admitting: Family Medicine

## 2018-08-18 VITALS — BP 128/84 | HR 66 | Temp 98.2°F | Resp 18 | Ht 63.5 in | Wt 276.0 lb

## 2018-08-18 DIAGNOSIS — F32A Depression, unspecified: Secondary | ICD-10-CM

## 2018-08-18 DIAGNOSIS — M25562 Pain in left knee: Secondary | ICD-10-CM

## 2018-08-18 DIAGNOSIS — F411 Generalized anxiety disorder: Secondary | ICD-10-CM

## 2018-08-18 DIAGNOSIS — F341 Dysthymic disorder: Secondary | ICD-10-CM

## 2018-08-18 DIAGNOSIS — G8929 Other chronic pain: Secondary | ICD-10-CM

## 2018-08-18 DIAGNOSIS — F329 Major depressive disorder, single episode, unspecified: Secondary | ICD-10-CM

## 2018-08-18 MED ORDER — DESVENLAFAXINE SUCCINATE ER 50 MG PO TB24: 50.0000 mg | ORAL_TABLET | Freq: Every day | ORAL | 5 refills | Status: DC

## 2018-08-18 NOTE — Patient Instructions (Addendum)
Please taper off prozac- take 2 pills a day for a week, then 1 pill a day for a week.  Then change over to the Pristiq that I gave you today in hopes of improving your mood I would also really recommend that you talk to a counselor, and I gave you some info about this today  We will have you see orthopedics about your knee pain- I will arrange this for you  Please see me in 3 weeks to check on how you are doing  If you are at risk of self harm please call 911 or go to the nearest Er

## 2018-08-19 ENCOUNTER — Encounter (INDEPENDENT_AMBULATORY_CARE_PROVIDER_SITE_OTHER): Payer: Self-pay | Admitting: Orthopaedic Surgery

## 2018-08-19 ENCOUNTER — Ambulatory Visit (INDEPENDENT_AMBULATORY_CARE_PROVIDER_SITE_OTHER): Payer: BLUE CROSS/BLUE SHIELD | Admitting: Orthopaedic Surgery

## 2018-08-19 ENCOUNTER — Ambulatory Visit (INDEPENDENT_AMBULATORY_CARE_PROVIDER_SITE_OTHER): Payer: BLUE CROSS/BLUE SHIELD

## 2018-08-19 DIAGNOSIS — G8929 Other chronic pain: Secondary | ICD-10-CM

## 2018-08-19 DIAGNOSIS — M25562 Pain in left knee: Secondary | ICD-10-CM | POA: Diagnosis not present

## 2018-08-19 MED ORDER — LIDOCAINE HCL 1 % IJ SOLN
3.0000 mL | INTRAMUSCULAR | Status: AC | PRN
  Administered 2018-08-19: 3 mL

## 2018-08-19 MED ORDER — METHYLPREDNISOLONE ACETATE 40 MG/ML IJ SUSP
40.0000 mg | INTRAMUSCULAR | Status: AC | PRN
  Administered 2018-08-19: 40 mg via INTRA_ARTICULAR

## 2018-08-19 NOTE — Progress Notes (Signed)
Office Visit Note   Patient: Ann Walter           Date of Birth: April 25, 1966           MRN: [id number] Visit Date: 08/19/2018              Requested by: Pearline Cables, MD [address] [place], [place] [address] PCP: Pearline Cables, MD   Assessment & Plan: Visit Diagnoses:  1. Chronic pain of left knee     Plan: She would like [place] this PSA conservative as possible for now and I agree with at least trying a steroid injection in her left knee.  She has not had this before.  She understands the risk and benefits of injections and she did tolerate it.  We will see her back in about 3 weeks.  If she is not improving and continues to have locking catching with pivoting activities, we would obtain an MRI at that point of her left knee.  All question concerns were answered and addressed.  We will see her in 3 weeks to see how she is doing.  Follow-Up Instructions: Return in about 3 weeks (around 09/09/2018).   Orders:  Orders Placed This Encounter  Procedures  . XR KNEE 3 VIEW LEFT   No orders of the defined types were placed in this encounter.     Procedures: Large Joint Inj: L knee on 08/19/2018 2:17 PM Indications: diagnostic evaluation and pain Details: 22 G 1.5 in needle, superolateral approach  Arthrogram: No  Medications: 3 mL lidocaine 1 %; 40 mg methylPREDNISolone acetate 40 MG/ML Outcome: tolerated well, no immediate complications Procedure, treatment alternatives, risks and benefits explained, specific risks discussed. Consent was given by the patient. Immediately prior to procedure a time out was called to verify the correct patient, procedure, equipment, support staff and site/side marked as required. Patient was prepped and draped in the usual sterile fashion.       Clinical Data: No additional findings.   Subjective: Chief Complaint  Patient presents with  . Left Knee - Pain  Patient is a very pleasant 52 year old female who comes  in with chief complaint of left knee pain is been worsening for 2 to 3 months.  It hurts mainly when she kneels down and significantly hurts her with pivoting activities.  It has been some lateral and some medial.  She denies any specific injury.  She denies a significant amount of swelling.  She is someone who is not a diabetic but is morbidly obese with a BMI of 48.  HPI  Review of Systems She currently denies any headache, chest pain, shortness of breath, fever, chills, nausea, vomiting.  Objective: Vital Signs: There were no vitals taken for this visit.  Physical Exam She is alert and oriented x3 and in no acute distress Ortho Exam Examination of her left knee shows that it does hyperextend as does her right knee.  There is some patellofemoral crepitation bilaterally.  She does have pain with a McMurray's stress to the medial and lateral compartments.  Her Lockman's exam is negative. Specialty Comments:  No specialty comments available.  Imaging: Xr Knee 3 View Left  Result Date: 08/19/2018 3 views of the left knee show no acute findings.  The alignment is overall well-maintained.  There is slight patellofemoral degenerative changes.    PMFS History: Patient Active Problem List   Diagnosis Date Noted  . Right foot pain 02/04/2018  . Left shoulder  pain 12/22/2017  . Upper airway cough syndrome 09/14/2017  . Pre-diabetes 11/22/2015  . Morbid obesity due to excess calories (HCC) 11/21/2015  . Ovarian cyst 02/28/2015  . Chest pain, atypical 03/08/2014  . Heart murmur 03/08/2014  . Heart palpitations 03/08/2014  . GAD (generalized anxiety disorder) 02/13/2014  . Depression   . Essential hypertension    Past Medical History:  Diagnosis Date  . Allergy   . Benign hematuria 12/2009   WORKUP WITH DR. NESI WAS NEGATIVE  . Depression   . Diabetes mellitus without complication (HCC)    prediabetes per pt  . Heart murmur   . HTN (hypertension)   . Obesity   . UTI (urinary  tract infection), uncomplicated     Family History  Problem Relation Age of Onset  . Diabetes Mother   . Heart disease Mother        HAD TRIPLE BYPASS  . Hypertension Mother   . Kidney disease Father   . Breast cancer Sister   . Breast cancer Maternal Grandmother   . Cancer Maternal Grandmother        LYMPH NODE CANCER  . Cancer Maternal Aunt        COLORECTAL   . COPD Brother   . Kidney failure Brother   . Colon cancer Neg Hx     Past Surgical History:  Procedure Laterality Date  . CHOLECYSTECTOMY  10/2010  . CYSTECTOMY     x 2 from chest   Social History   Occupational History  . 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 Occupation: HAZARD/FLOOD    Employer: [employer]  Tobacco Use  . Smoking status: Never Smoker  . Smokeless tobacco: Never Used  Substance and Sexual Activity  . Alcohol use: Yes    Alcohol/week: 0.0 standard drinks    Comment: occasionally  . Drug use: No  . Sexual activity: Yes    Partners: Male    Birth control/protection: Pill    Comment: INTERCOURSE AGE 58, SEXUAL PARTNERS MORE THAN 5

## 2018-08-30 ENCOUNTER — Telehealth (INDEPENDENT_AMBULATORY_CARE_PROVIDER_SITE_OTHER): Payer: Self-pay | Admitting: Orthopaedic Surgery

## 2018-08-30 NOTE — Telephone Encounter (Signed)
Patient wondering if she can have something alittle stronger. States her knee is so painful

## 2018-08-30 NOTE — Telephone Encounter (Signed)
Patient left a message stating that she is out of her Naproxen and has been alternating Tylenol and Motrin which is really not helping and wanted to know if Dr. Magnus Ivan will prescribed anything else for her.  CB#[phone].  Thank you.

## 2018-08-31 MED ORDER — TRAMADOL HCL 50 MG PO TABS: 50.0000 mg | ORAL_TABLET | Freq: Four times a day (QID) | ORAL | 0 refills | Status: DC | PRN

## 2018-08-31 NOTE — Telephone Encounter (Signed)
I sent in some tramadol 

## 2018-08-31 NOTE — Telephone Encounter (Signed)
Patient aware this was sent in for her  

## 2018-09-08 ENCOUNTER — Inpatient Hospital Stay: Admission: RE | Admit: 2018-09-08 | Payer: BLUE CROSS/BLUE SHIELD | Source: Ambulatory Visit

## 2018-09-08 ENCOUNTER — Ambulatory Visit (INDEPENDENT_AMBULATORY_CARE_PROVIDER_SITE_OTHER): Payer: BLUE CROSS/BLUE SHIELD | Admitting: Orthopaedic Surgery

## 2018-09-08 ENCOUNTER — Other Ambulatory Visit: Payer: Self-pay | Admitting: Women's Health

## 2018-09-08 ENCOUNTER — Encounter (INDEPENDENT_AMBULATORY_CARE_PROVIDER_SITE_OTHER): Payer: Self-pay | Admitting: Orthopaedic Surgery

## 2018-09-08 ENCOUNTER — Other Ambulatory Visit (INDEPENDENT_AMBULATORY_CARE_PROVIDER_SITE_OTHER): Payer: Self-pay

## 2018-09-08 DIAGNOSIS — M25562 Pain in left knee: Secondary | ICD-10-CM | POA: Diagnosis not present

## 2018-09-08 DIAGNOSIS — G8929 Other chronic pain: Secondary | ICD-10-CM | POA: Diagnosis not present

## 2018-09-08 DIAGNOSIS — Z1231 Encounter for screening mammogram for malignant neoplasm of breast: Secondary | ICD-10-CM

## 2018-09-08 MED ORDER — TIZANIDINE HCL 4 MG PO TABS: 4.0000 mg | ORAL_TABLET | Freq: Three times a day (TID) | ORAL | 0 refills | Status: DC | PRN

## 2018-09-08 MED ORDER — TRAMADOL HCL 50 MG PO TABS: 50.0000 mg | ORAL_TABLET | Freq: Four times a day (QID) | ORAL | 0 refills | Status: DC | PRN

## 2018-09-08 NOTE — Progress Notes (Signed)
The patient comes in today with continued severe left knee pain.  It is causing her to cry to the amount of pain she is having.  I provided a steroid injection in that knee a few weeks ago and she says it did not help at all not even a single bit and her pain is severely worse.  It is globally around her left knee.  It is quite severe to her.  She denies any fever chills or nausea or vomiting.  She does appear uncomfortable.  On exam left knee is slightly warm.  She has global joint tenderness.  Any attempts of flexion extension because her severe pain and she is certainly careful as a result of this.  Amount of loss of diagnosing her knee without the help of an MRI at this point given the severity of her pain and given the failure of conservative treatment including rest, ice, heat, anti-inflammatories, activity modification and intra-articular steroid injection.  Since this is so severe to her I have no choice but to order a MRI of her left knee to rule out internal derangement.  We will send in some tramadol and Zanaflex for her while we wait for MRI.

## 2018-09-12 ENCOUNTER — Ambulatory Visit
Admission: RE | Admit: 2018-09-12 | Discharge: 2018-09-12 | Disposition: A | Discharge status: Home / Self Care | Payer: BLUE CROSS/BLUE SHIELD | Source: Ambulatory Visit | Attending: Orthopaedic Surgery | Admitting: Orthopaedic Surgery

## 2018-09-12 DIAGNOSIS — M25562 Pain in left knee: Principal | ICD-10-CM

## 2018-09-12 DIAGNOSIS — G8929 Other chronic pain: Secondary | ICD-10-CM

## 2018-09-20 ENCOUNTER — Encounter (INDEPENDENT_AMBULATORY_CARE_PROVIDER_SITE_OTHER): Payer: Self-pay | Admitting: Physician Assistant

## 2018-09-20 ENCOUNTER — Ambulatory Visit (INDEPENDENT_AMBULATORY_CARE_PROVIDER_SITE_OTHER): Payer: BLUE CROSS/BLUE SHIELD | Admitting: Physician Assistant

## 2018-09-20 DIAGNOSIS — M25562 Pain in left knee: Secondary | ICD-10-CM | POA: Diagnosis not present

## 2018-09-20 MED ORDER — TIZANIDINE HCL 4 MG PO TABS: 4.0000 mg | ORAL_TABLET | Freq: Three times a day (TID) | ORAL | 0 refills | Status: DC | PRN

## 2018-09-20 MED ORDER — TRAMADOL HCL 50 MG PO TABS: 50.0000 mg | ORAL_TABLET | Freq: Four times a day (QID) | ORAL | 0 refills | Status: DC | PRN

## 2018-09-20 NOTE — Addendum Note (Signed)
Addended by: Richardean CanalLARK, Tee Richeson on: 09/20/2018 12:27 PM   Modules accepted: Orders

## 2018-09-20 NOTE — Progress Notes (Addendum)
Office Visit Note   Patient: Ann Walter           Date of Birth: 09/17/1966           MRN: [id number] Visit Date: 09/20/2018              Requested by: Pearline Cables, MD [address] [place], [place] [address] PCP: Pearline Cables, MD   Assessment & Plan: Visit Diagnoses:  1. Acute pain of left knee     Plan: She is given a prescription for single-point cane that she is to use in the right hand offload the left knee.  No high impact activities.  She will work on quad strengthening of the knee.  We will see her back in a month.  Discussed with her that given the fact that her knee pain is dissipating hopefully by offloading this and given more time that she will be able to heal the insufficiency fracture.  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  Follow-Up Instructions: Return in about 6 weeks (around 11/01/2018).   Orders:  No orders of the defined types were placed in this encounter.  Meds ordered this encounter  Medications  . DISCONTD: traMADol (ULTRAM) 50 MG tablet    Sig: Take 1-2 tablets (50-100 mg total) by mouth every 6 (six) hours as needed.    Dispense:  30 tablet    Refill:  0  . DISCONTD: tiZANidine (ZANAFLEX) 4 MG tablet    Sig: Take 1 tablet (4 mg total) by mouth every 8 (eight) hours as needed for muscle spasms.    Dispense:  30 tablet    Refill:  0      Procedures: No procedures performed   Clinical Data: No additional findings.   Subjective: Chief Complaint  Patient presents with  . Right Knee - Pain    HPI Ann Walter returns today to go over the MRI of her left knee.  She states her left knee pain is improving.  Still most of her pain is along medial aspect of the knee.  She does have a catching-like sensation in the knee at times. MRI is reviewed of the left knee actual images are shown to the patient.  She has a subchondral insufficiency fracture medial  tibial plateau, radial tear of the medial meniscus posterior root mild to moderate arthritic changes throughout the knee. Review of Systems Please see HPI  Objective: Vital Signs: There were no vitals taken for this visit.  Physical Exam General: Well-developed well-nourished female no acute distress mood affect appropriate Psych: Alert and oriented x3 Ortho Exam Left knee full extension flexion to approximately 110 degrees.  She has tenderness along the medial joint line with him over the medial tibial plateau region.  Calf supple nontender.  No instability valgus varus stressing.  No tenderness over the medial collateral ligament. Specialty Comments:  No specialty comments available.  Imaging: No results found.   PMFS History: Patient Active Problem List   Diagnosis Date Noted  . Right foot pain 02/04/2018  . Left shoulder pain 12/22/2017  . Upper airway cough syndrome 09/14/2017  . Pre-diabetes 11/22/2015  . Morbid obesity due to excess calories (HCC) 11/21/2015  . Ovarian cyst 02/28/2015  . Chest pain, atypical 03/08/2014  . Heart murmur 03/08/2014  . Heart palpitations 03/08/2014  . GAD (generalized anxiety disorder) 02/13/2014  . Depression   . Essential hypertension  Past Medical History:  Diagnosis Date  . Allergy   . Benign hematuria 12/2009   WORKUP WITH DR. NESI WAS NEGATIVE  . Depression   . Diabetes mellitus without complication (HCC)    prediabetes per pt  . Heart murmur   . HTN (hypertension)   . Obesity   . UTI (urinary tract infection), uncomplicated     Family History  Problem Relation Age of Onset  . Diabetes Mother   . Heart disease Mother        HAD TRIPLE BYPASS  . Hypertension Mother   . Kidney disease Father   . Breast cancer Sister   . Breast cancer Maternal Grandmother   . Cancer Maternal Grandmother        LYMPH NODE CANCER  . Cancer Maternal Aunt        COLORECTAL   . COPD Brother   . Kidney failure Brother   . Colon cancer  Neg Hx     Past Surgical History:  Procedure Laterality Date  . CHOLECYSTECTOMY  10/2010  . CYSTECTOMY     x 2 from chest   Social History   Occupational History  . Occupation: Advertising copywriterMortage insurance    Employer: [employer]  . Occupation: HAZARD/FLOOD    Employer: [employer]  Tobacco Use  . Smoking status: Never Smoker  . Smokeless tobacco: Never Used  Substance and Sexual Activity  . Alcohol use: Yes    Alcohol/week: 0.0 standard drinks    Comment: occasionally  . Drug use: No  . Sexual activity: Yes    Partners: Male    Birth control/protection: Pill    Comment: INTERCOURSE AGE 69, SEXUAL PARTNERS MORE THAN 5

## 2018-10-06 ENCOUNTER — Telehealth (INDEPENDENT_AMBULATORY_CARE_PROVIDER_SITE_OTHER): Payer: Self-pay | Admitting: Orthopaedic Surgery

## 2018-10-06 ENCOUNTER — Other Ambulatory Visit (INDEPENDENT_AMBULATORY_CARE_PROVIDER_SITE_OTHER): Payer: Self-pay | Admitting: Orthopaedic Surgery

## 2018-10-06 MED ORDER — TIZANIDINE HCL 4 MG PO TABS: 4.0000 mg | ORAL_TABLET | Freq: Three times a day (TID) | ORAL | 0 refills | Status: DC | PRN

## 2018-10-06 NOTE — Telephone Encounter (Signed)
I sent some more in. 

## 2018-10-06 NOTE — Telephone Encounter (Signed)
Please advise 

## 2018-10-06 NOTE — Telephone Encounter (Signed)
Patient called to state that she needed refill for her muscle relaxer's due to the fact that her dog ate the entire bottle of her medication.  She was hoping that you could call this in today.  Please call patient to advise.   [phone]

## 2018-10-19 ENCOUNTER — Ambulatory Visit
Admission: RE | Admit: 2018-10-19 | Discharge: 2018-10-19 | Disposition: A | Discharge status: Home / Self Care | Payer: BLUE CROSS/BLUE SHIELD | Source: Ambulatory Visit | Attending: Women's Health | Admitting: Women's Health

## 2018-10-19 DIAGNOSIS — Z1231 Encounter for screening mammogram for malignant neoplasm of breast: Secondary | ICD-10-CM

## 2018-10-25 ENCOUNTER — Other Ambulatory Visit (INDEPENDENT_AMBULATORY_CARE_PROVIDER_SITE_OTHER): Payer: Self-pay | Admitting: Orthopaedic Surgery

## 2018-10-25 NOTE — Telephone Encounter (Signed)
Please advise 

## 2018-11-01 ENCOUNTER — Encounter (INDEPENDENT_AMBULATORY_CARE_PROVIDER_SITE_OTHER): Payer: Self-pay | Admitting: Physician Assistant

## 2018-11-01 ENCOUNTER — Ambulatory Visit (INDEPENDENT_AMBULATORY_CARE_PROVIDER_SITE_OTHER): Payer: BLUE CROSS/BLUE SHIELD | Admitting: Physician Assistant

## 2018-11-01 DIAGNOSIS — M25562 Pain in left knee: Secondary | ICD-10-CM | POA: Diagnosis not present

## 2018-11-01 MED ORDER — TIZANIDINE HCL 4 MG PO TABS: 4.0000 mg | ORAL_TABLET | Freq: Every day | ORAL | 0 refills | Status: DC

## 2018-11-01 MED ORDER — TRAMADOL HCL 50 MG PO TABS: 50.0000 mg | ORAL_TABLET | Freq: Four times a day (QID) | ORAL | 0 refills | Status: DC | PRN

## 2018-11-01 NOTE — Progress Notes (Signed)
Office Visit Note   Patient: GAO BLAUER           Date of Birth: [birth year]           MRN: [id number] Visit Date: 11/01/2018              Requested by: Pearline Cables, MD [address] [place], [place] [address] PCP: Pearline Cables, MD   Assessment & Plan: Visit Diagnoses:  1. Acute pain of left knee     Plan: We will try to gain approval for supplemental injection left knee which definitely could be beneficial due to her tricompartmental arthritis.  She will continue to work on weight loss and quad strengthening.  Follow-up with [place] in 1 month.  Follow-Up Instructions: Return in about 4 weeks (around 11/29/2018) for Supplemental injection.   Orders:  No orders of the defined types were placed in this encounter.  Meds ordered this encounter  Medications  . tiZANidine (ZANAFLEX) 4 MG tablet    Sig: Take 1 tablet (4 mg total) by mouth at bedtime.    Dispense:  30 tablet    Refill:  0  . traMADol (ULTRAM) 50 MG tablet    Sig: Take 1-2 tablets (50-100 mg total) by mouth every 6 (six) hours as needed.    Dispense:  30 tablet    Refill:  0      Procedures: No procedures performed   Clinical Data: No additional findings.   Subjective: Chief Complaint  Patient presents with  . Left Knee - Follow-up    HPI  Mrs. Dayal comes in today follow-up of her left knee insufficiency fracture.  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  He is asking for refill on her tramadol and her Zanaflex.  She is taking tramadol mostly at night and Zanaflex at night due to knee pain.  She feels that her left knee pain is slowly improving but no longer radiates up about the knee.  She is been doing some quad strengthening.  She does note that the knee is giving way couple of times on.  Review of Systems See HPI  Objective: Vital Signs: There were no vitals taken for this visit.  Physical Exam Constitutional:      Appearance: Normal appearance. She is  not ill-appearing or diaphoretic.  Neurological:     Mental Status: She is alert and oriented to person, place, and time.  Psychiatric:        Mood and Affect: Mood normal.     Ortho Exam Left knee full extension no instability valgus varus stressing.  Good range of motion the knee.  Tenderness along medial joint line. Specialty Comments:  No specialty comments available.  Imaging: No results found.   PMFS History: Patient Active Problem List   Diagnosis Date Noted  . Right foot pain 02/04/2018  . Left shoulder pain 12/22/2017  . Upper airway cough syndrome 09/14/2017  . Pre-diabetes 11/22/2015  . Morbid obesity due to excess calories (HCC) 11/21/2015  . Ovarian cyst 02/28/2015  . Chest pain, atypical 03/08/2014  . Heart murmur 03/08/2014  . Heart palpitations 03/08/2014  . GAD (generalized anxiety disorder) 02/13/2014  . Depression   . Essential hypertension    Past Medical History:  Diagnosis Date  . Allergy   . Benign hematuria 12/2009   WORKUP WITH DR. NESI WAS NEGATIVE  . Depression   . Diabetes mellitus without complication (HCC)    prediabetes per pt  .  Heart murmur   . HTN (hypertension)   . Obesity   . UTI (urinary tract infection), uncomplicated     Family History  Problem Relation Age of Onset  . Diabetes Mother   . Heart disease Mother        HAD TRIPLE BYPASS  . Hypertension Mother   . Kidney disease Father   . Breast cancer Sister   . Breast cancer Maternal Grandmother   . Cancer Maternal Grandmother        LYMPH NODE CANCER  . Cancer Maternal Aunt        COLORECTAL   . COPD Brother   . Kidney failure Brother   . Colon cancer Neg Hx     Past Surgical History:  Procedure Laterality Date  . CHOLECYSTECTOMY  10/2010  . CYSTECTOMY     x 2 from chest   Social History   Occupational History  . 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 Occupation: HAZARD/FLOOD    Employer: [employer]  Tobacco Use  . Smoking  status: Never Smoker  . Smokeless tobacco: Never Used  Substance and Sexual Activity  . Alcohol use: Yes    Alcohol/week: 0.0 standard drinks    Comment: occasionally  . Drug use: No  . Sexual activity: Yes    Partners: Male    Birth control/protection: Pill    Comment: INTERCOURSE AGE 15, SEXUAL PARTNERS MORE THAN 5

## 2018-11-09 ENCOUNTER — Other Ambulatory Visit: Payer: Self-pay | Admitting: Family Medicine

## 2018-11-09 DIAGNOSIS — F411 Generalized anxiety disorder: Secondary | ICD-10-CM

## 2018-11-10 NOTE — Telephone Encounter (Signed)
Patient last seen October  N[facility]:  11/01/2018  2   11/01/2018  Tramadol Hcl 50 Mg Tablet  30.00 4 Gi Cla  [id number]  Wal (2001)  0/0 37.50 MME Comm Ins  [place]  10/09/2018  2   08/10/2018  Alprazolam 0.5 Mg Tablet  90.00 30 Je Cop  [id number]  Wal (2001)  2/2 3.00 LME Comm Ins  [place]  09/20/2018  2   09/20/2018  Tramadol Hcl 50 Mg Tablet  30.00 4 Gi Cla  [id number]  Wal (2001)  0/0 37.50 MME Comm Ins  [place]  09/11/2018  2   08/10/2018  Alprazolam 0.5 Mg Tablet  90.00 30 Je Cop  [id number]  Wal (2001)  1/2 3.00 LME Comm Ins  [place]  09/08/2018  2   09/08/2018  Tramadol Hcl 50 Mg Tablet  30.00 4 Ch Bla  [id number]  Wal (2001)  0/0 37.50 MME Comm Ins  [place]  08/31/2018  2   08/31/2018  Tramadol Hcl 50 Mg Tablet  40.00 5 Ch Bla  [id number]  Wal (2001)  0/0 40.00 MME Comm Ins  [place]  08/10/2018  2   08/10/2018  Alprazolam 0.5 Mg Tablet  90.00 30 Je Cop  [id number]  Wal (2001)  0/2 3.00 LME Comm Ins  [place]  07/12/2018  2   05/12/2018  Alprazolam 0.5 Mg Tablet  90.00 30 Je Cop  [id number]  Wal (2001)  2/2 3.00 LME Comm Ins  [place]  06/12/2018  2   05/12/2018  Alprazolam 0.5 Mg Tablet  90.00 30 Je Cop  [id number]  Wal (2001)  1/2 3.00 LME Comm Ins  [place]  05/12/2018  2   05/12/2018  Alprazolam 0.5 Mg Tablet  90.00 30 Je Cop  [id number]  Wal (2001)  0/2 3.00 LME Comm Ins  [place]  04/13/2018  1   02/15/2018  Alprazolam 0.5 Mg Tablet  90.00 30 Je Cop  [id number]  Wal (2001)  2/2 3.00 LME Comm Ins  [place]  03/16/2018  1   02/15/2018  Alprazolam 0.5 Mg Tablet  90.00 30 Je Cop  [id number]  Wal (2001)  1/2

## 2018-11-23 ENCOUNTER — Other Ambulatory Visit (INDEPENDENT_AMBULATORY_CARE_PROVIDER_SITE_OTHER): Payer: Self-pay | Admitting: Physician Assistant

## 2018-11-23 NOTE — Telephone Encounter (Signed)
Please advise 

## 2018-11-29 ENCOUNTER — Telehealth (INDEPENDENT_AMBULATORY_CARE_PROVIDER_SITE_OTHER): Payer: Self-pay | Admitting: Radiology

## 2018-11-29 ENCOUNTER — Ambulatory Visit (INDEPENDENT_AMBULATORY_CARE_PROVIDER_SITE_OTHER): Payer: BLUE CROSS/BLUE SHIELD | Admitting: Physician Assistant

## 2018-11-29 ENCOUNTER — Encounter (INDEPENDENT_AMBULATORY_CARE_PROVIDER_SITE_OTHER): Payer: Self-pay | Admitting: Physician Assistant

## 2018-11-29 VITALS — Ht 63.5 in | Wt 295.0 lb

## 2018-11-29 DIAGNOSIS — M1712 Unilateral primary osteoarthritis, left knee: Secondary | ICD-10-CM

## 2018-11-29 NOTE — Progress Notes (Signed)
HPI: Ms. Mutchler returns today follow-up of her left knee pain.  She states she has good and bad days in regards to her knee pain.  She takes tramadol mainly at night.  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  She is been having significant pain since July 2019.   Physical exam: Left knee full extension flexion to approximately 105 110 degrees.  No instability valgus varus stressing..  She has tenderness along the medial joint line.  Impression: Left knee insufficiency fracture with tricompartmental arthritis  Plan: Supplemental injection had not been approved as of today and therefore may no charge today's office visit.  Patient keep working on Dance movement psychotherapist.  We will call her once the supplemental injection is available.

## 2018-11-29 NOTE — Telephone Encounter (Signed)
Please get authorization for gel injection for left knee. Diagnosis: Left Knee OA. Thanks.

## 2018-12-02 NOTE — Telephone Encounter (Signed)
Noted  

## 2018-12-07 ENCOUNTER — Other Ambulatory Visit (INDEPENDENT_AMBULATORY_CARE_PROVIDER_SITE_OTHER): Payer: Self-pay | Admitting: Orthopaedic Surgery

## 2018-12-07 NOTE — Telephone Encounter (Signed)
Please advise 

## 2018-12-13 ENCOUNTER — Telehealth (INDEPENDENT_AMBULATORY_CARE_PROVIDER_SITE_OTHER): Payer: Self-pay | Admitting: Orthopaedic Surgery

## 2018-12-13 ENCOUNTER — Telehealth (INDEPENDENT_AMBULATORY_CARE_PROVIDER_SITE_OTHER): Payer: Self-pay

## 2018-12-13 NOTE — Telephone Encounter (Signed)
Talked with patient concerning gel injection.  

## 2018-12-13 NOTE — Telephone Encounter (Signed)
Patient called left voicemail message asking if the gel injection for her knee was approved yet? The number to contact patient is [phone]

## 2018-12-13 NOTE — Telephone Encounter (Signed)
Submitted VOB for SynviscOne, left knee. 

## 2018-12-16 ENCOUNTER — Telehealth (INDEPENDENT_AMBULATORY_CARE_PROVIDER_SITE_OTHER): Payer: Self-pay

## 2018-12-16 NOTE — Telephone Encounter (Signed)
Talked with patient and advised her that Edwinna Areola is not covered through her insurance.  Would like to know what other options she may have for her left knee?  Cb# [phone].  Please advise.  Thank you.

## 2018-12-16 NOTE — Telephone Encounter (Signed)
Please advise 

## 2018-12-20 NOTE — Telephone Encounter (Signed)
We could try a cortisone injection

## 2018-12-20 NOTE — Telephone Encounter (Signed)
Can we call her and see if she would like to try cortisone injection, if so can you make her an appt please

## 2018-12-21 NOTE — Telephone Encounter (Signed)
She got no significant relief with cortisone injection. She has been denied for supplemental injection . Her knee pain has been on going since July 2019. Her MRI is as below. Do you want her to just follow up with [place] in the office to get new radiographs and exam her again?     MRI back in 11/19 Patellofemoral: Mild diffuse thinning with small focal full-thickness cartilage loss over the patellar apex with underlying subchondral marrow edema.  Medial:  Mild diffuse thinning without focal defect.  Lateral: Mild diffuse thinning with tiny focal high-grade cartilage defect over the central weight-bearing lateral femoral condyle.  Subchondral insuffiencey fracture of the medial tibial plateau.  Medial meniscal root complete radial tear .

## 2018-12-21 NOTE — Telephone Encounter (Signed)
Called patient asked if she wanted to try the cortisone injection? Patient said she tried the cortisone injection before and it did not help. Patient asked what else can be done? Patient number is [phone]

## 2018-12-21 NOTE — Telephone Encounter (Signed)
We should see her in the office for a new x-ray and to decide if we should just scope the knee and consider a subchondralplasty.

## 2018-12-21 NOTE — Telephone Encounter (Signed)
Please advise. Thanks.  

## 2018-12-22 NOTE — Telephone Encounter (Signed)
Please have her come in for x-rays and exam

## 2018-12-22 NOTE — Telephone Encounter (Signed)
IC appt scheduled.  

## 2018-12-28 ENCOUNTER — Other Ambulatory Visit (INDEPENDENT_AMBULATORY_CARE_PROVIDER_SITE_OTHER): Payer: Self-pay | Admitting: Physician Assistant

## 2018-12-28 ENCOUNTER — Ambulatory Visit (INDEPENDENT_AMBULATORY_CARE_PROVIDER_SITE_OTHER): Payer: Self-pay | Admitting: Orthopaedic Surgery

## 2018-12-29 ENCOUNTER — Telehealth (INDEPENDENT_AMBULATORY_CARE_PROVIDER_SITE_OTHER): Payer: Self-pay

## 2018-12-29 NOTE — Telephone Encounter (Signed)
Please advise 

## 2018-12-29 NOTE — Telephone Encounter (Signed)
Refill on Tizanidine ?

## 2019-01-03 ENCOUNTER — Other Ambulatory Visit: Payer: Self-pay

## 2019-01-03 ENCOUNTER — Emergency Department (HOSPITAL_COMMUNITY)
Admission: EM | Admit: 2019-01-03 | Discharge: 2019-01-03 | Disposition: A | Discharge status: Home / Self Care | Payer: BLUE CROSS/BLUE SHIELD | Attending: Emergency Medicine | Admitting: Emergency Medicine

## 2019-01-03 ENCOUNTER — Encounter (HOSPITAL_COMMUNITY): Payer: Self-pay | Admitting: Emergency Medicine

## 2019-01-03 ENCOUNTER — Emergency Department (HOSPITAL_COMMUNITY): Payer: BLUE CROSS/BLUE SHIELD

## 2019-01-03 DIAGNOSIS — S61210A Laceration without foreign body of right index finger without damage to nail, initial encounter: Secondary | ICD-10-CM | POA: Diagnosis not present

## 2019-01-03 DIAGNOSIS — Y999 Unspecified external cause status: Secondary | ICD-10-CM | POA: Diagnosis not present

## 2019-01-03 DIAGNOSIS — W293XXA Contact with powered garden and outdoor hand tools and machinery, initial encounter: Secondary | ICD-10-CM | POA: Insufficient documentation

## 2019-01-03 DIAGNOSIS — Y93H2 Activity, gardening and landscaping: Secondary | ICD-10-CM | POA: Diagnosis not present

## 2019-01-03 DIAGNOSIS — E119 Type 2 diabetes mellitus without complications: Secondary | ICD-10-CM | POA: Diagnosis not present

## 2019-01-03 DIAGNOSIS — Z79899 Other long term (current) drug therapy: Secondary | ICD-10-CM | POA: Diagnosis not present

## 2019-01-03 DIAGNOSIS — Y92017 Garden or yard in single-family (private) house as the place of occurrence of the external cause: Secondary | ICD-10-CM | POA: Diagnosis not present

## 2019-01-03 DIAGNOSIS — Z7984 Long term (current) use of oral hypoglycemic drugs: Secondary | ICD-10-CM | POA: Insufficient documentation

## 2019-01-03 DIAGNOSIS — I10 Essential (primary) hypertension: Secondary | ICD-10-CM | POA: Insufficient documentation

## 2019-01-03 DIAGNOSIS — S61216A Laceration without foreign body of right little finger without damage to nail, initial encounter: Secondary | ICD-10-CM

## 2019-01-03 MED ORDER — LIDOCAINE HCL (PF) 1 % IJ SOLN
10.0000 mL | Freq: Once | INTRAMUSCULAR | Status: AC
  Administered 2019-01-03: 10 mL
  Filled 2019-01-03: qty 10

## 2019-01-03 MED ORDER — CEPHALEXIN 500 MG PO CAPS: 500.0000 mg | ORAL_CAPSULE | Freq: Two times a day (BID) | ORAL | 0 refills | Status: AC

## 2019-01-03 MED ORDER — HYDROCODONE-ACETAMINOPHEN 5-325 MG PO TABS
1.0000 | ORAL_TABLET | Freq: Once | ORAL | Status: AC
  Administered 2019-01-03: 1 via ORAL
  Filled 2019-01-03: qty 1

## 2019-01-03 NOTE — ED Notes (Signed)
Patient transported to X-ray 

## 2019-01-03 NOTE — ED Triage Notes (Signed)
Pt. Stated, I was trimming and I cut my rt. Index finger with hedge trimmers 20 min ago.

## 2019-01-03 NOTE — Discharge Instructions (Signed)
Your evaluated today for laceration.  You need to have the sutures removed in 10 days.  You have 5 sutures to your index finger and 1 suture to your right pinky.  I have prescribed you Keflex.  Please take as prescribed.  You may have the sutures removed by her PCP, urgent care or the emergency department.  If you notice any redness, swelling or signs of infection please seek reevaluation sooner.  return to the ED for any new or worsening symptoms.

## 2019-01-03 NOTE — ED Provider Notes (Signed)
[facility] Provider Note   CSN: [id number] Arrival date & time: 01/03/19  1752  History   Chief Complaint Chief Complaint  Patient presents with  . Finger Injury    HPI Ann Walter is a 53 y.o. female with past medical history significant for diabetes who presents for evaluation of finger laceration.  Patient states Ann Walter was trying hedges when he tripped of the extremity lacerated her right index finger.  Patient states bleeding was controlled.  Denies anticoagulation.  Patient states Ann Walter did water while at home PTA.  Has not taken anything for pain PTA pain an 8/10.  Pain does not radiate.  Tetanus up-to-date, within the last 5 years.  Denies fever, chills, nausea, vomiting, decreased range of motion, numbness or tingling in her extremities, warmth, redness to her extremities.    Patient obtained from patient.  No interpretor was used.      Laceration  Location:  Hand Hand laceration location:  R fingers Length:  2cm Depth:  Through dermis Quality: straight   Bleeding: controlled   Time since incident:  2 hours Laceration mechanism:  Metal edge Pain details:    Quality:  Aching   Severity:  Moderate   Timing:  Constant   Progression:  Unchanged Foreign body present:  Unable to specify Relieved by:  None tried Worsened by:  Nothing Ineffective treatments:  None tried Tetanus status:  Up to date Associated symptoms: no fever, no focal weakness, no numbness, no rash, no redness, no swelling and no streaking     Past Medical History:  Diagnosis Date  . Allergy   . Benign hematuria 12/2009   WORKUP WITH DR. NESI WAS NEGATIVE  . Depression   . Diabetes mellitus without complication (HCC)    prediabetes per pt  . Heart murmur   . HTN (hypertension)   . Obesity   . UTI (urinary tract infection), uncomplicated     Patient Active Problem List   Diagnosis Date Noted  . Right foot pain 02/04/2018  . Left shoulder pain 12/22/2017   . Upper airway cough syndrome 09/14/2017  . Pre-diabetes 11/22/2015  . Morbid obesity due to excess calories (HCC) 11/21/2015  . Ovarian cyst 02/28/2015  . Chest pain, atypical 03/08/2014  . Heart murmur 03/08/2014  . Heart palpitations 03/08/2014  . GAD (generalized anxiety disorder) 02/13/2014  . Depression   . Essential hypertension     Past Surgical History:  Procedure Laterality Date  . CHOLECYSTECTOMY  10/2010  . CYSTECTOMY     x 2 from chest     OB History    Gravida  1   Para      Term      Preterm      AB  1   Living  0     SAB      TAB  1   Ectopic      Multiple      Live Births               Home Medications    Prior to Admission medications   Medication Sig Start Date End Date Taking? Authorizing Provider  ALPRAZolam (XANAX) 0.5 MG tablet TAKE 1 TABLET BY MOUTH THREE TIMES DAILY AS NEEDED Patient taking differently: Take 0.5 mg by mouth 3 (three) times daily as needed.  11/10/18  Yes Copland, Gwenlyn FoundJessica C, MD  desvenlafaxine (PRISTIQ) 50 MG 24 hr tablet Take 1 tablet (50 mg total) by mouth daily. 08/18/18  Yes Copland, Gwenlyn Found, MD  metFORMIN (GLUCOPHAGE) 500 MG tablet Take 1 tablet (500 mg total) by mouth daily. 02/22/18  Yes Copland, Gwenlyn Found, MD  norethindrone (MICRONOR,CAMILA,ERRIN) 0.35 MG tablet Take 1 tablet (0.35 mg total) by mouth daily. 07/07/18  Yes Harrington Challenger, NP  telmisartan (MICARDIS) 80 MG tablet Take 1 tablet (80 mg total) by mouth daily. 05/12/18  Yes Copland, Gwenlyn Found, MD  tiZANidine (ZANAFLEX) 4 MG tablet TAKE 1 TABLET BY MOUTH AT BEDTIME Patient taking differently: Take 4 mg by mouth at bedtime.  12/29/18  Yes Kirtland Bouchard, PA-C  traMADol (ULTRAM) 50 MG tablet TAKE 1 TO 2 TABLETS BY MOUTH EVERY 6 HOURS AS NEEDED Patient taking differently: Take 50-100 mg by mouth every 6 (six) hours as needed for moderate pain.  12/07/18  Yes Kathryne Hitch, MD  cephALEXin (KEFLEX) 500 MG capsule Take 1 capsule (500 mg total)  by mouth 2 (two) times daily for 5 days. 01/03/19 01/08/19  Charmian Forbis A, PA-C  FLUoxetine (PROZAC) 20 MG tablet Take 3 tablets (60 mg total) by mouth daily. Patient not taking: Reported on 01/03/2019 03/03/18   Copland, Gwenlyn Found, MD    Family History Family History  Problem Relation Age of Onset  . Diabetes Mother   . Heart disease Mother        HAD TRIPLE BYPASS  . Hypertension Mother   . Kidney disease Father   . Breast cancer Sister   . Breast cancer Maternal Grandmother   . Cancer Maternal Grandmother        LYMPH NODE CANCER  . Cancer Maternal Aunt        COLORECTAL   . COPD Brother   . Kidney failure Brother   . Colon cancer Neg Hx     Social History Social History   Tobacco Use  . Smoking status: Never Smoker  . Smokeless tobacco: Never Used  Substance Use Topics  . Alcohol use: Yes    Alcohol/week: 0.0 standard drinks    Comment: occasionally  . Drug use: No     Allergies   Wellbutrin [bupropion] and Sulfonamide derivatives   Review of Systems Review of Systems  Constitutional: Negative.  Negative for fever.  HENT: Negative.   Respiratory: Negative.   Cardiovascular: Negative.   Gastrointestinal: Negative.   Genitourinary: Negative.   Musculoskeletal: Negative.   Skin: Positive for wound. Negative for rash.  Neurological: Negative.  Negative for focal weakness.  All other systems reviewed and are negative.    Physical Exam Updated Vital Signs BP (!) 157/83 (BP Location: Left Arm)   Pulse 91   Temp 98.5 F (36.9 C) (Oral)   Resp 17   Ht 5' 3.5" (1.613 m)   Wt 133.8 kg   SpO2 99%   BMI 51.44 kg/m   Physical Exam Vitals signs and nursing note reviewed.  Constitutional:      General: Ann Walter is not in acute distress.    Appearance: Ann Walter is well-developed. Ann Walter is not ill-appearing, toxic-appearing or diaphoretic.  HENT:     Head: Normocephalic and atraumatic.     Nose: Nose normal.     Mouth/Throat:     Mouth: Mucous membranes are moist.       Pharynx: Oropharynx is clear.  Eyes:     Pupils: Pupils are equal, round, and reactive to light.  Neck:     Musculoskeletal: Normal range of motion.  Cardiovascular:     Rate and Rhythm: Normal rate.  Pulses: Normal pulses.     Heart sounds: Normal heart sounds.  Pulmonary:     Effort: Pulmonary effort is normal. No respiratory distress.     Breath sounds: Normal breath sounds.  Abdominal:     General: There is no distension.     Tenderness: There is no abdominal tenderness. There is no guarding or rebound.  Musculoskeletal: Normal range of motion.     Comments: Full range of motion right upper extremity with flexion, extension.  No evidence of tendon or ligament injury.  No bony Tenderness.  Skin:    General: Skin is warm and dry.     Comments: 2 cm laceration to medial aspect left proximal index finger.  Bleeding controlled.  No surrounding edema, erythema or warmth.  5mm laceration to palmar surface small fifth digit on right upper extremity.  Brisk capillary refill.  Neurological:     Mental Status: Ann Walter is alert.     Comments: 5/5 bilateral upper extremities.  Equal and full grip strength.     ED Treatments / Results  Labs (all labs ordered are listed, but only abnormal results are displayed) Labs Reviewed - No data to display  EKG None  Radiology Dg Finger Index Right  Result Date: 01/03/2019 CLINICAL DATA:  Laceration. EXAM: RIGHT INDEX FINGER 2+V COMPARISON:  None. FINDINGS: There is no evidence of fracture or dislocation. There is no evidence of arthropathy or other focal bone abnormality. Soft tissues are unremarkable. IMPRESSION: Negative. Electronically Signed   By: Gerome Sam III M.D   On: 01/03/2019 19:14    Procedures .Marland KitchenLaceration Repair Date/Time: 01/03/2019 8:09 PM Performed by: Linwood Dibbles, PA-C Authorized by: Linwood Dibbles, PA-C   Consent:    Consent obtained:  Verbal   Consent given by:  Patient   Risks discussed:  Infection,  need for additional repair, pain, poor cosmetic result and poor wound healing   Alternatives discussed:  No treatment and delayed treatment Universal protocol:    Procedure explained and questions answered to patient or proxy's satisfaction: yes     Relevant documents present and verified: yes     Test results available and properly labeled: yes     Imaging studies available: yes     Required blood products, implants, devices, and special equipment available: yes     Site/side marked: yes     Immediately prior to procedure, a time out was called: yes     Patient identity confirmed:  Verbally with patient Anesthesia (see MAR for exact dosages):    Anesthesia method:  Local infiltration   Local anesthetic:  Lidocaine 1% w/o epi Laceration details:    Location:  Finger   Finger location:  R index finger   Length (cm):  2 Repair type:    Repair type:  Simple Pre-procedure details:    Preparation:  Patient was prepped and draped in usual sterile fashion and imaging obtained to evaluate for foreign bodies Exploration:    Hemostasis achieved with:  Direct pressure   Wound exploration: wound explored through full range of motion and entire depth of wound probed and visualized     Wound extent: no muscle damage noted, no nerve damage noted, no tendon damage noted, no underlying fracture noted and no vascular damage noted     Contaminated: no   Treatment:    Area cleansed with:  Betadine   Amount of cleaning:  Standard   Irrigation solution:  Sterile saline Skin repair:    Repair method:  Sutures   Suture size:  4-0   Suture material:  Prolene   Number of sutures:  5 Approximation:    Approximation:  Close Post-procedure details:    Dressing:  Non-adherent dressing   Patient tolerance of procedure:  Tolerated well, no immediate complications .Marland KitchenLaceration Repair Date/Time: 01/03/2019 8:10 PM Performed by: Linwood Dibbles, PA-C Authorized by: Linwood Dibbles, PA-C   Consent:     Consent obtained:  Verbal   Consent given by:  Patient   Risks discussed:  Infection, need for additional repair, pain, poor cosmetic result and poor wound healing   Alternatives discussed:  No treatment and delayed treatment Universal protocol:    Procedure explained and questions answered to patient or proxy's satisfaction: yes     Relevant documents present and verified: yes     Test results available and properly labeled: yes     Imaging studies available: yes     Required blood products, implants, devices, and special equipment available: yes     Site/side marked: yes     Immediately prior to procedure, a time out was called: yes     Patient identity confirmed:  Verbally with patient Anesthesia (see MAR for exact dosages):    Anesthesia method:  Local infiltration   Local anesthetic:  Lidocaine 1% w/o epi Laceration details:    Location:  Finger   Finger location:  R small finger   Length (cm):  0.4 Repair type:    Repair type:  Simple Pre-procedure details:    Preparation:  Patient was prepped and draped in usual sterile fashion and imaging obtained to evaluate for foreign bodies Exploration:    Hemostasis achieved with:  Direct pressure   Wound exploration: wound explored through full range of motion and entire depth of wound probed and visualized   Treatment:    Area cleansed with:  Betadine   Amount of cleaning:  Standard   Irrigation solution:  Sterile saline Skin repair:    Repair method:  Sutures   Suture size:  4-0   Suture material:  Prolene   Number of sutures:  1 Approximation:    Approximation:  Close Post-procedure details:    Dressing:  Non-adherent dressing   Patient tolerance of procedure:  Tolerated well, no immediate complications   (including critical care time)  Medications Ordered in ED Medications  lidocaine (PF) (XYLOCAINE) 1 % injection 10 mL (10 mLs Infiltration Given 01/03/19 1830)  HYDROcodone-acetaminophen (NORCO/VICODIN) 5-325 MG per  tablet 1 tablet (1 tablet Oral Given 01/03/19 1833)     Initial Impression / Assessment and Plan / ED Course  I have reviewed the triage vital signs and the nursing notes.  Pertinent labs & imaging results that were available during my care of the patient were reviewed by me and considered in my medical decision making (see chart for details).  53 year old female appears otherwise well presents for evaluation of hand injury.  Afebrile, nonseptic, non-ill-appearing.  History of diabetes.  Laceration occurred 2 hours PTA.  Bleeding controlled.  Patient is diabetic.  Tetanus up-to-date.  2 cm laceration to second digit on right upper extremity.  4 mm laceration to the fifth digit on right upper extremity.  Will obtain plain films, laceration repair and reevaluate.  Neurovascularly intact.  FROM.  Pressure irrigation performed. Wound explored and base of wound visualized in a bloodless field without evidence of foreign body.  Laceration occurred < 8 hours prior to repair which was well tolerated. Pt discharged with Keflex antibiotics  2/2 DM.  No signs of active infection.  Discussed suture home care with patient and answered questions. Pt to follow-up for wound check and suture removal in 10 days; they are to return to the ED sooner for signs of infection. Pt is hemodynamically stable with no complaints prior to dc.      Final Clinical Impressions(s) / ED Diagnoses   Final diagnoses:  Laceration of right index finger without foreign body without damage to nail, initial encounter  Laceration of right little finger without foreign body without damage to nail, initial encounter    ED Discharge Orders         Ordered    cephALEXin (KEFLEX) 500 MG capsule  2 times daily     01/03/19 2013           Suesan Mohrmann A, PA-C 01/03/19 2016    Charlynne Pander, MD 01/04/19 1113

## 2019-01-05 ENCOUNTER — Ambulatory Visit (INDEPENDENT_AMBULATORY_CARE_PROVIDER_SITE_OTHER): Payer: BLUE CROSS/BLUE SHIELD | Admitting: Orthopaedic Surgery

## 2019-01-05 ENCOUNTER — Other Ambulatory Visit: Payer: Self-pay

## 2019-01-05 ENCOUNTER — Encounter (INDEPENDENT_AMBULATORY_CARE_PROVIDER_SITE_OTHER): Payer: Self-pay | Admitting: Orthopaedic Surgery

## 2019-01-05 ENCOUNTER — Ambulatory Visit (INDEPENDENT_AMBULATORY_CARE_PROVIDER_SITE_OTHER): Payer: Self-pay | Admitting: Orthopaedic Surgery

## 2019-01-05 ENCOUNTER — Ambulatory Visit: Payer: BLUE CROSS/BLUE SHIELD | Admitting: Nurse Practitioner

## 2019-01-05 ENCOUNTER — Ambulatory Visit (INDEPENDENT_AMBULATORY_CARE_PROVIDER_SITE_OTHER): Payer: BLUE CROSS/BLUE SHIELD

## 2019-01-05 DIAGNOSIS — M25562 Pain in left knee: Secondary | ICD-10-CM | POA: Diagnosis not present

## 2019-01-05 DIAGNOSIS — M23322 Other meniscus derangements, posterior horn of medial meniscus, left knee: Secondary | ICD-10-CM | POA: Diagnosis not present

## 2019-01-05 DIAGNOSIS — S82132A Displaced fracture of medial condyle of left tibia, initial encounter for closed fracture: Secondary | ICD-10-CM | POA: Diagnosis not present

## 2019-01-05 NOTE — Progress Notes (Signed)
The patient is someone I been following for the last several months after an acute injury to her left knee.  The MRI did show a meniscal root tear the posterior horn the medial meniscus on the left knee that was an acute but there was a significant insufficiency fracture of the medial tibial plateau with edema in the bone.  We have tried conservative treatment with activity modification and rest.  We tried steroid injections and she is continued to have severe left knee pain.  Her insurance company would not approve hyaluronic acid.  At this point with her continued pain and the failure of conservative treatment and arthroscopic intervention is warranted.  On exam she has significant medial joint line tenderness and pain along the medial tibial plateau.  She has a positive McMurray sign to the medial aspect of her knee.  I did obtain new x-rays are reviewed today and the insufficiency fracture does not appear to have healed.  There is joint space narrowing on that side as well.  I went over the MRI from a few months ago again with her showing the medial tibial plateau with significant edema throughout it.  This was on the left knee.  There is also a meniscal root tear.  She only had mild thinning of the cartilage in that compartment of her knee.  At this point I am recommending a left knee arthroscopy with a partial medial meniscectomy combined with a sub-chondroplasty with placing calcium pyrophosphate in the medial tibial plateau.  At a long and thorough discussion about that surgery as well as the risk and benefits of surgery.  I described in detail showing her knee model and her films of what that surgery involves.  I would then want her to at least offload that knee using a cane, crutches or a walker for the next month after the surgery.  This can be done as an outpatient.  All question concerns were answered and addressed.  At this point I do feel it is medically warranted given her continued pain as well  as her decreased mobility and the impairment this is adding her quality of life.  We will work on getting this scheduled and then see her back in 2 weeks postoperative with an AP and lateral of the left operative knee.

## 2019-01-12 ENCOUNTER — Ambulatory Visit: Payer: Self-pay | Admitting: Nurse Practitioner

## 2019-01-12 NOTE — Progress Notes (Signed)
[facility] [address], [address] [place], [place] [address] [phone]  Date:  01/13/2019   Name:  Ann Walter   DOB:  1966-03-14   MRN:  [id number]  PCP:  Pearline Cables, MD    Chief Complaint: Suture / Staple Removal (right hand); Chest Congestion (no fever, cough, back pain, would like tessalon perles, no SOB, no ST); and Flea Bites   History of Present Illness:  Ann Walter is a 53 y.o. very pleasant female patient who presents with the following:  Here today for suture removal - she was seen in the ER on 3/9 with a hand laceration  Cut her right index finger while working in her yard with electric hedge clippers, also sustained a small laceration on the pinky finger  Tetanus UTD   Also she notes that she has had some chest congestion for about 3-4 days-symptoms are mild Some cough Also nasal congestion  No fever noted- she has checked her temp She used some tessalon perles which did help- she would like a refill of these No travel the last 2 weeks  No direct contact with COVID 19 that she is aware of    Patient Active Problem List   Diagnosis Date Noted  . Right foot pain 02/04/2018  . Left shoulder pain 12/22/2017  . Upper airway cough syndrome 09/14/2017  . Pre-diabetes 11/22/2015  . Morbid obesity due to excess calories (HCC) 11/21/2015  . Ovarian cyst 02/28/2015  . Chest pain, atypical 03/08/2014  . Heart murmur 03/08/2014  . Heart palpitations 03/08/2014  . GAD (generalized anxiety disorder) 02/13/2014  . Depression   . Essential hypertension     Past Medical History:  Diagnosis Date  . Allergy   . Benign hematuria 12/2009   WORKUP WITH DR. NESI WAS NEGATIVE  . Depression   . Diabetes mellitus without complication (HCC)    prediabetes per pt  . Heart murmur   . HTN (hypertension)   . Obesity   . UTI (urinary tract infection), uncomplicated     Past Surgical History:  Procedure  Laterality Date  . CHOLECYSTECTOMY  10/2010  . CYSTECTOMY     x 2 from chest    Social History   Tobacco Use  . Smoking status: Never Smoker  . Smokeless tobacco: Never Used  Substance Use Topics  . Alcohol use: Yes    Alcohol/week: 0.0 standard drinks    Comment: occasionally  . Drug use: No    Family History  Problem Relation Age of Onset  . Diabetes Mother   . Heart disease Mother        HAD TRIPLE BYPASS  . Hypertension Mother   . Kidney disease Father   . Breast cancer Sister   . Breast cancer Maternal Grandmother   . Cancer Maternal Grandmother        LYMPH NODE CANCER  . Cancer Maternal Aunt        COLORECTAL   . COPD Brother   . Kidney failure Brother   . Colon cancer Neg Hx     Allergies  Allergen Reactions  . Wellbutrin [Bupropion] Swelling    Throat swelling   . Sulfonamide Derivatives Other (See Comments)    Unknown per pt, it's been a long time    Medication list has been reviewed and updated.  Current Outpatient Medications on File Prior to Visit  Medication Sig Dispense Refill  . ALPRAZolam Prudy Feeler)  0.5 MG tablet TAKE 1 TABLET BY MOUTH THREE TIMES DAILY AS NEEDED (Patient taking differently: Take 0.5 mg by mouth 3 (three) times daily as needed. ) 90 tablet 2  . desvenlafaxine (PRISTIQ) 50 MG 24 hr tablet Take 1 tablet (50 mg total) by mouth daily. 30 tablet 5  . FLUoxetine (PROZAC) 20 MG tablet Take 3 tablets (60 mg total) by mouth daily. 90 tablet 11  . metFORMIN (GLUCOPHAGE) 500 MG tablet Take 1 tablet (500 mg total) by mouth daily. 90 tablet 3  . norethindrone (MICRONOR,CAMILA,ERRIN) 0.35 MG tablet Take 1 tablet (0.35 mg total) by mouth daily. 3 Package 4  . telmisartan (MICARDIS) 80 MG tablet Take 1 tablet (80 mg total) by mouth daily. 90 tablet 3  . tiZANidine (ZANAFLEX) 4 MG tablet TAKE 1 TABLET BY MOUTH AT BEDTIME (Patient taking differently: Take 4 mg by mouth at bedtime. ) 30 tablet 0  . traMADol (ULTRAM) 50 MG tablet TAKE 1 TO 2 TABLETS  BY MOUTH EVERY 6 HOURS AS NEEDED (Patient taking differently: Take 50-100 mg by mouth every 6 (six) hours as needed for moderate pain. ) 30 tablet 0   No current facility-administered medications on file prior to visit.     Review of Systems:  As per HPI- otherwise negative. No fever chills  Physical Examination: Vitals:   01/13/19 1121 01/13/19 1144  BP: (!) 144/98 130/90  Pulse: 77   Resp: 18   Temp: 98.5 F (36.9 C)   SpO2: 98%    Vitals:   01/13/19 1121  Weight: 295 lb (133.8 kg)  Height: 5' 3.5" (1.613 m)   Body mass index is 51.44 kg/m. Ideal Body Weight: Weight in (lb) to have BMI = 25: 143.1  GEN: WDWN, NAD, Non-toxic, A & O x 3, obese, otherwise looks well HEENT: Atraumatic, Normocephalic. Neck supple. No masses, No LAD.  Bilateral TM wnl, oropharynx normal.  PEERL,EOMI.   Ears and Nose: No external deformity. CV: RRR, No M/G/R. No JVD. No thrill. No extra heart sounds. PULM: CTA B, no wheezes, crackles, rhonchi. No retractions. No resp. distress. No accessory muscle use.  Lungs are clear EXTR: No c/c/e NEURO Normal gait.  PSYCH: Normally interactive. Conversant. Not depressed or anxious appearing.  Calm demeanor.  Right hand: 2 sutured lacerations, one on the index finger and 1 on the pinky finger, both on the palmar aspect.  Both appear to be healing appropriately, no sign of infection.  Removed all sutures, 6 in all. Advised patient to keep the larger wound covered with a Band-Aid so that the edges do not catch and pull the wound open.   Assessment and Plan: Visit for suture removal  Cough  Hot flashes  Essential hypertension  Remove sutures as above.  Advised patient on wound care Refilled Tessalon Perles to use as needed for mild cough  Patient is also bothered by occasional hot flashes.  Offered reassurance that these are normal with menopause  Blood pressure is under reasonable control with current myocarditis prescription  Signed Abbe Amsterdam, MD

## 2019-01-13 ENCOUNTER — Other Ambulatory Visit: Payer: Self-pay

## 2019-01-13 ENCOUNTER — Encounter: Payer: Self-pay | Admitting: Family Medicine

## 2019-01-13 ENCOUNTER — Ambulatory Visit (INDEPENDENT_AMBULATORY_CARE_PROVIDER_SITE_OTHER): Payer: BLUE CROSS/BLUE SHIELD | Admitting: Family Medicine

## 2019-01-13 VITALS — BP 130/90 | HR 77 | Temp 98.5°F | Resp 18 | Ht 63.5 in | Wt 295.0 lb

## 2019-01-13 DIAGNOSIS — R05 Cough: Secondary | ICD-10-CM | POA: Diagnosis not present

## 2019-01-13 DIAGNOSIS — Z4802 Encounter for removal of sutures: Secondary | ICD-10-CM

## 2019-01-13 DIAGNOSIS — I10 Essential (primary) hypertension: Secondary | ICD-10-CM

## 2019-01-13 DIAGNOSIS — R232 Flushing: Secondary | ICD-10-CM | POA: Diagnosis not present

## 2019-01-13 DIAGNOSIS — R059 Cough, unspecified: Secondary | ICD-10-CM

## 2019-01-13 MED ORDER — BENZONATATE 100 MG PO CAPS: 100.0000 mg | ORAL_CAPSULE | Freq: Three times a day (TID) | ORAL | 0 refills | Status: DC | PRN

## 2019-01-13 NOTE — Patient Instructions (Signed)
Good to see you today-  I refilled your tessalon perles to use as needed for cough We took out your stitches today-keep the index finger wound covered when you are active to avoid pulling the wound open  Let me know if any sign of infection  Take care

## 2019-02-02 ENCOUNTER — Other Ambulatory Visit (INDEPENDENT_AMBULATORY_CARE_PROVIDER_SITE_OTHER): Payer: Self-pay | Admitting: Physician Assistant

## 2019-02-03 NOTE — Telephone Encounter (Signed)
Please advise 

## 2019-02-09 ENCOUNTER — Other Ambulatory Visit: Payer: Self-pay | Admitting: Family Medicine

## 2019-02-09 DIAGNOSIS — F411 Generalized anxiety disorder: Secondary | ICD-10-CM

## 2019-03-12 ENCOUNTER — Other Ambulatory Visit (INDEPENDENT_AMBULATORY_CARE_PROVIDER_SITE_OTHER): Payer: Self-pay | Admitting: Physician Assistant

## 2019-03-14 NOTE — Telephone Encounter (Signed)
Please advise 

## 2019-03-25 ENCOUNTER — Other Ambulatory Visit: Payer: Self-pay | Admitting: Family Medicine

## 2019-03-25 DIAGNOSIS — F329 Major depressive disorder, single episode, unspecified: Secondary | ICD-10-CM

## 2019-03-25 DIAGNOSIS — F411 Generalized anxiety disorder: Secondary | ICD-10-CM

## 2019-03-25 DIAGNOSIS — F32A Depression, unspecified: Secondary | ICD-10-CM

## 2019-04-11 ENCOUNTER — Other Ambulatory Visit (INDEPENDENT_AMBULATORY_CARE_PROVIDER_SITE_OTHER): Payer: Self-pay | Admitting: Physician Assistant

## 2019-05-09 ENCOUNTER — Other Ambulatory Visit (INDEPENDENT_AMBULATORY_CARE_PROVIDER_SITE_OTHER): Payer: Self-pay | Admitting: Orthopaedic Surgery

## 2019-05-10 NOTE — Telephone Encounter (Signed)
Please advise 

## 2019-05-29 ENCOUNTER — Other Ambulatory Visit: Payer: Self-pay | Admitting: Family Medicine

## 2019-05-29 DIAGNOSIS — I10 Essential (primary) hypertension: Secondary | ICD-10-CM

## 2019-06-11 ENCOUNTER — Other Ambulatory Visit: Payer: Self-pay | Admitting: Family Medicine

## 2019-06-11 DIAGNOSIS — F411 Generalized anxiety disorder: Secondary | ICD-10-CM

## 2019-06-13 ENCOUNTER — Other Ambulatory Visit (INDEPENDENT_AMBULATORY_CARE_PROVIDER_SITE_OTHER): Payer: Self-pay | Admitting: Orthopaedic Surgery

## 2019-06-13 NOTE — Telephone Encounter (Signed)
Please advise, refill. 

## 2019-06-19 ENCOUNTER — Other Ambulatory Visit: Payer: Self-pay | Admitting: Family Medicine

## 2019-06-19 DIAGNOSIS — R7303 Prediabetes: Secondary | ICD-10-CM

## 2019-07-14 ENCOUNTER — Other Ambulatory Visit (INDEPENDENT_AMBULATORY_CARE_PROVIDER_SITE_OTHER): Payer: Self-pay | Admitting: Orthopaedic Surgery

## 2019-07-15 NOTE — Telephone Encounter (Signed)
Please advise 

## 2019-07-18 ENCOUNTER — Other Ambulatory Visit: Payer: Self-pay | Admitting: Orthopaedic Surgery

## 2019-07-18 ENCOUNTER — Encounter: Payer: Self-pay | Admitting: Orthopaedic Surgery

## 2019-07-18 MED ORDER — NABUMETONE 750 MG PO TABS: 750.0000 mg | ORAL_TABLET | Freq: Two times a day (BID) | ORAL | 1 refills | Status: DC | PRN

## 2019-08-12 ENCOUNTER — Encounter: Payer: Self-pay | Admitting: Family Medicine

## 2019-08-12 ENCOUNTER — Other Ambulatory Visit: Payer: Self-pay | Admitting: Family Medicine

## 2019-08-12 DIAGNOSIS — F411 Generalized anxiety disorder: Secondary | ICD-10-CM

## 2019-08-12 NOTE — Telephone Encounter (Signed)
Requesting:Alprazolam Contract:none, needs csc UDS:04/27/2017 needs uds Last Visit:01/13/19 Next Visit:none scheduled Last Refill:06/13/2019  Please Advise

## 2019-08-13 ENCOUNTER — Other Ambulatory Visit (INDEPENDENT_AMBULATORY_CARE_PROVIDER_SITE_OTHER): Payer: Self-pay | Admitting: Orthopaedic Surgery

## 2019-08-15 NOTE — Telephone Encounter (Signed)
Please advise 

## 2019-08-17 ENCOUNTER — Ambulatory Visit: Payer: BC Managed Care – PPO | Admitting: Family Medicine

## 2019-08-21 NOTE — Progress Notes (Signed)
[facility] [address], [address] [place], [place] [address] [phone]  Date:  08/24/2019   Name:  Ann Walter   DOB:  Jul 05, 1966   MRN:  [id number]  PCP:  Pearline Cables, MD    Chief Complaint: No chief complaint on file.   History of Present Illness:  Ann Walter is a 52 y.o. 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  Reminded her to report any bleeding that occurs after 1 year of menopause  Lab Results  Component Value Date   HGBA1C 6.2 02/22/2018    Patient Active Problem List   Diagnosis Date Noted  . Right foot pain 02/04/2018  . Left shoulder pain 12/22/2017  . Upper airway cough syndrome 09/14/2017  . Pre-diabetes 11/22/2015  . Morbid obesity due to excess calories (HCC) 11/21/2015  . Ovarian cyst 02/28/2015  . Chest pain, atypical 03/08/2014  . Heart murmur 03/08/2014  . Heart palpitations  03/08/2014  . GAD (generalized anxiety disorder) 02/13/2014  . Depression   . Essential hypertension     Past Medical History:  Diagnosis Date  . Allergy   . Benign hematuria 12/2009   WORKUP WITH DR. NESI WAS NEGATIVE  . Depression   . Diabetes mellitus without complication (HCC)    prediabetes per pt  . Heart murmur   . HTN (hypertension)   . Obesity   . UTI (urinary tract infection), uncomplicated     Past Surgical History:  Procedure Laterality Date  . CHOLECYSTECTOMY  10/2010  . CYSTECTOMY     x 2 from chest    Social History   Tobacco Use  . Smoking status: Never Smoker  . Smokeless tobacco: Never Used  Substance Use Topics  . Alcohol use: Yes    Alcohol/week: 0.0 standard drinks    Comment: occasionally  . Drug use: No    Family History  Problem Relation Age of Onset  . Diabetes Mother   . Heart disease Mother        HAD TRIPLE BYPASS  . Hypertension Mother   . Kidney disease Father   . Breast cancer Sister   . Breast cancer Maternal Grandmother   . Cancer Maternal Grandmother        LYMPH NODE CANCER  . Cancer Maternal Aunt        COLORECTAL   .  COPD Brother   . Kidney failure Brother   . Colon cancer Neg Hx     Allergies  Allergen Reactions  . Wellbutrin [Bupropion] Swelling    Throat swelling   . Sulfonamide Derivatives Other (See Comments)    Unknown per pt, it's been a long time    Medication list has been reviewed and updated.  Current Outpatient Medications on File Prior to Visit  Medication Sig Dispense Refill  . ALPRAZolam (XANAX) 0.5 MG tablet Take 1 tablet by mouth three times daily as needed 90 tablet 0  . benzonatate (TESSALON) 100 MG capsule Take 1 capsule (100 mg total) by mouth 3 (three) times daily as needed for cough. 60 capsule 0  . desvenlafaxine (PRISTIQ) 50 MG 24 hr tablet Take 1 tablet by mouth daily 30 tablet 5  . FLUoxetine (PROZAC) 20 MG tablet Take 3 tablets (60 mg total) by mouth daily. 90 tablet 11  .  metFORMIN (GLUCOPHAGE) 500 MG tablet Take 1 tablet by mouth once daily 90 tablet 1  . nabumetone (RELAFEN) 750 MG tablet Take 1 tablet (750 mg total) by mouth 2 (two) times daily as needed. 60 tablet 1  . norethindrone (MICRONOR,CAMILA,ERRIN) 0.35 MG tablet Take 1 tablet (0.35 mg total) by mouth daily. 3 Package 4  . telmisartan (MICARDIS) 80 MG tablet Take 1 tablet by mouth once daily 90 tablet 0  . tiZANidine (ZANAFLEX) 4 MG tablet TAKE 1 TABLET BY MOUTH AT BEDTIME 30 tablet 0  . traMADol (ULTRAM) 50 MG tablet TAKE 1 TO 2 TABLETS BY MOUTH EVERY 6 HOURS AS NEEDED (Patient taking differently: Take 50-100 mg by mouth every 6 (six) hours as needed for moderate pain. ) 30 tablet 0   No current facility-administered medications on file prior to visit.     Review of Systems:  As per HPI- otherwise negative. No chest pain or shortness of breath, no fever or chills  Physical Examination: There were no vitals filed for this visit. There were no vitals filed for this visit. There is no height or weight on file to calculate BMI. Ideal Body Weight:   Patient observed over video.  She looks well, her normal self.  No cough, wheezing, distress is noted  Assessment and Plan: GAD (generalized anxiety disorder) - Plan: desvenlafaxine (PRISTIQ) 50 MG 24 hr tablet  Dysthymia  Essential hypertension - Plan: CBC, Comprehensive metabolic panel  Pre-diabetes - Plan: Hemoglobin A1c  Screening for thyroid disorder - Plan: TSH  Screening for HIV (human immunodeficiency virus) - Plan: HIV Antibody (routine testing w rflx)  Screening for hyperlipidemia - Plan: Lipid panel  Depression, unspecified depression type - Plan: desvenlafaxine (PRISTIQ) 50 MG 24 hr tablet  Encounter for screening mammogram for malignant neoplasm of breast - Plan: MM 3D SCREEN BREAST BILATERAL  Cough - Plan: benzonatate (TESSALON) 100 MG capsule  Virtual visit today for follow-up Refilled her Pristiq Refilled Tessalon which  she uses for cough at times Ordered mammogram We will have her come in for a flu shot and routine labs at her convenience, I have asked my nurse to call her- Will plan further follow- up pending labs.   Signed Lamar Blinks, MD

## 2019-08-24 ENCOUNTER — Encounter: Payer: Self-pay | Admitting: Family Medicine

## 2019-08-24 ENCOUNTER — Ambulatory Visit (INDEPENDENT_AMBULATORY_CARE_PROVIDER_SITE_OTHER): Payer: BC Managed Care – PPO | Admitting: Family Medicine

## 2019-08-24 DIAGNOSIS — I10 Essential (primary) hypertension: Secondary | ICD-10-CM

## 2019-08-24 DIAGNOSIS — Z13220 Encounter for screening for lipoid disorders: Secondary | ICD-10-CM

## 2019-08-24 DIAGNOSIS — F32A Depression, unspecified: Secondary | ICD-10-CM

## 2019-08-24 DIAGNOSIS — R7303 Prediabetes: Secondary | ICD-10-CM | POA: Diagnosis not present

## 2019-08-24 DIAGNOSIS — R059 Cough, unspecified: Secondary | ICD-10-CM

## 2019-08-24 DIAGNOSIS — R05 Cough: Secondary | ICD-10-CM

## 2019-08-24 DIAGNOSIS — Z114 Encounter for screening for human immunodeficiency virus [HIV]: Secondary | ICD-10-CM

## 2019-08-24 DIAGNOSIS — F341 Dysthymic disorder: Secondary | ICD-10-CM

## 2019-08-24 DIAGNOSIS — Z1231 Encounter for screening mammogram for malignant neoplasm of breast: Secondary | ICD-10-CM

## 2019-08-24 DIAGNOSIS — Z1329 Encounter for screening for other suspected endocrine disorder: Secondary | ICD-10-CM

## 2019-08-24 DIAGNOSIS — F411 Generalized anxiety disorder: Secondary | ICD-10-CM

## 2019-08-24 DIAGNOSIS — F329 Major depressive disorder, single episode, unspecified: Secondary | ICD-10-CM

## 2019-08-24 MED ORDER — BENZONATATE 100 MG PO CAPS: 100.0000 mg | ORAL_CAPSULE | Freq: Three times a day (TID) | ORAL | 1 refills | Status: DC | PRN

## 2019-08-24 MED ORDER — DESVENLAFAXINE SUCCINATE ER 50 MG PO TB24: 50.0000 mg | ORAL_TABLET | Freq: Every day | ORAL | 3 refills | Status: DC

## 2019-09-01 ENCOUNTER — Ambulatory Visit: Payer: BC Managed Care – PPO | Admitting: Family Medicine

## 2019-09-01 ENCOUNTER — Other Ambulatory Visit (INDEPENDENT_AMBULATORY_CARE_PROVIDER_SITE_OTHER): Payer: BC Managed Care – PPO

## 2019-09-01 ENCOUNTER — Other Ambulatory Visit: Payer: Self-pay

## 2019-09-01 ENCOUNTER — Ambulatory Visit (INDEPENDENT_AMBULATORY_CARE_PROVIDER_SITE_OTHER): Payer: BC Managed Care – PPO

## 2019-09-01 ENCOUNTER — Encounter: Payer: Self-pay | Admitting: Family Medicine

## 2019-09-01 DIAGNOSIS — Z13220 Encounter for screening for lipoid disorders: Secondary | ICD-10-CM

## 2019-09-01 DIAGNOSIS — Z1329 Encounter for screening for other suspected endocrine disorder: Secondary | ICD-10-CM | POA: Diagnosis not present

## 2019-09-01 DIAGNOSIS — R7303 Prediabetes: Secondary | ICD-10-CM

## 2019-09-01 DIAGNOSIS — I10 Essential (primary) hypertension: Secondary | ICD-10-CM

## 2019-09-01 DIAGNOSIS — Z23 Encounter for immunization: Secondary | ICD-10-CM | POA: Diagnosis not present

## 2019-09-01 DIAGNOSIS — Z114 Encounter for screening for human immunodeficiency virus [HIV]: Secondary | ICD-10-CM

## 2019-09-01 LAB — CBC
HCT: 38.7 % (ref 36.0–46.0)
Hemoglobin: 12.8 g/dL (ref 12.0–15.0)
MCHC: 33.1 g/dL (ref 30.0–36.0)
MCV: 90.1 fl (ref 78.0–100.0)
Platelets: 236 10*3/uL (ref 150.0–400.0)
RBC: 4.3 Mil/uL (ref 3.87–5.11)
RDW: 14.3 % (ref 11.5–15.5)
WBC: 6.4 10*3/uL (ref 4.0–10.5)

## 2019-09-01 LAB — COMPREHENSIVE METABOLIC PANEL
ALT: 14 U/L (ref 0–35)
AST: 15 U/L (ref 0–37)
Albumin: 4.2 g/dL (ref 3.5–5.2)
Alkaline Phosphatase: 93 U/L (ref 39–117)
BUN: 17 mg/dL (ref 6–23)
CO2: 28 mEq/L (ref 19–32)
Calcium: 9.2 mg/dL (ref 8.4–10.5)
Chloride: 103 mEq/L (ref 96–112)
Creatinine, Ser: 0.77 mg/dL (ref 0.40–1.20)
GFR: 78.16 mL/min (ref >60.00)
Glucose, Bld: 93 mg/dL (ref 70–99)
Potassium: 4.5 mEq/L (ref 3.5–5.1)
Sodium: 139 mEq/L (ref 135–145)
Total Bilirubin: 0.4 mg/dL (ref 0.2–1.2)
Total Protein: 6.3 g/dL (ref 6.0–8.3)

## 2019-09-01 LAB — HEMOGLOBIN A1C: Hgb A1c MFr Bld: 6.2 % (ref 4.6–6.5)

## 2019-09-01 LAB — LIPID PANEL
Cholesterol: 157 mg/dL (ref 0–200)
HDL: 59.8 mg/dL (ref >39.00)
LDL Cholesterol: 82 mg/dL (ref 0–99)
NonHDL: 97.26
Total CHOL/HDL Ratio: 3
Triglycerides: 77 mg/dL (ref 0.0–149.0)
VLDL: 15.4 mg/dL (ref 0.0–40.0)

## 2019-09-01 LAB — TSH: TSH: 1.89 u[IU]/mL (ref 0.35–4.50)

## 2019-09-02 ENCOUNTER — Other Ambulatory Visit: Payer: Self-pay | Admitting: Family Medicine

## 2019-09-02 DIAGNOSIS — I10 Essential (primary) hypertension: Secondary | ICD-10-CM

## 2019-09-02 LAB — HIV ANTIBODY (ROUTINE TESTING W REFLEX): HIV 1&2 Ab, 4th Generation: NONREACTIVE

## 2019-09-12 ENCOUNTER — Other Ambulatory Visit: Payer: Self-pay | Admitting: Family Medicine

## 2019-09-12 DIAGNOSIS — F411 Generalized anxiety disorder: Secondary | ICD-10-CM

## 2019-09-12 NOTE — Telephone Encounter (Signed)
Requesting:Alprazolam Contract:none PTE:LMRA Last Visit:08/24/2019 Next Visit:none Last Refill:08/12/2019  Please Advise

## 2019-09-13 ENCOUNTER — Other Ambulatory Visit (INDEPENDENT_AMBULATORY_CARE_PROVIDER_SITE_OTHER): Payer: Self-pay | Admitting: Orthopaedic Surgery

## 2019-09-13 NOTE — Telephone Encounter (Signed)
Please advise 

## 2019-09-14 ENCOUNTER — Other Ambulatory Visit: Payer: Self-pay | Admitting: Family Medicine

## 2019-09-14 DIAGNOSIS — Z1231 Encounter for screening mammogram for malignant neoplasm of breast: Secondary | ICD-10-CM

## 2019-09-29 ENCOUNTER — Other Ambulatory Visit: Payer: Self-pay

## 2019-09-29 DIAGNOSIS — Z20822 Contact with and (suspected) exposure to covid-19: Secondary | ICD-10-CM

## 2019-10-02 LAB — NOVEL CORONAVIRUS, NAA: SARS-CoV-2, NAA: NOT DETECTED

## 2019-10-13 ENCOUNTER — Other Ambulatory Visit (INDEPENDENT_AMBULATORY_CARE_PROVIDER_SITE_OTHER): Payer: Self-pay | Admitting: Orthopaedic Surgery

## 2019-10-13 NOTE — Telephone Encounter (Signed)
Please advise 

## 2019-11-14 ENCOUNTER — Other Ambulatory Visit (INDEPENDENT_AMBULATORY_CARE_PROVIDER_SITE_OTHER): Payer: Self-pay | Admitting: Orthopaedic Surgery

## 2019-11-14 NOTE — Telephone Encounter (Signed)
Fill? 

## 2019-11-14 NOTE — Telephone Encounter (Signed)
Noted  

## 2019-11-14 NOTE — Telephone Encounter (Signed)
Can send in this time but needs follow up for further refills.

## 2019-11-15 ENCOUNTER — Other Ambulatory Visit: Payer: Self-pay | Admitting: Family Medicine

## 2019-11-15 DIAGNOSIS — Z1231 Encounter for screening mammogram for malignant neoplasm of breast: Secondary | ICD-10-CM

## 2019-11-17 ENCOUNTER — Ambulatory Visit
Admission: RE | Admit: 2019-11-17 | Discharge: 2019-11-17 | Disposition: A | Discharge status: Home / Self Care | Payer: BC Managed Care – PPO | Source: Ambulatory Visit

## 2019-11-17 ENCOUNTER — Other Ambulatory Visit: Payer: Self-pay

## 2019-11-17 DIAGNOSIS — Z1231 Encounter for screening mammogram for malignant neoplasm of breast: Secondary | ICD-10-CM

## 2020-01-06 ENCOUNTER — Other Ambulatory Visit: Payer: Self-pay | Admitting: Family Medicine

## 2020-01-06 DIAGNOSIS — F411 Generalized anxiety disorder: Secondary | ICD-10-CM

## 2020-01-09 NOTE — Telephone Encounter (Signed)
Requesting:Alprazolam Contract: none HDI:XBOE Last Visit:08/24/2019 Next Visit:none Last Refill:09/12/2019  Please Advise

## 2020-01-28 ENCOUNTER — Encounter: Payer: Self-pay | Admitting: Family Medicine

## 2020-01-31 ENCOUNTER — Encounter: Payer: Self-pay | Admitting: Internal Medicine

## 2020-01-31 ENCOUNTER — Ambulatory Visit: Payer: BC Managed Care – PPO | Admitting: Internal Medicine

## 2020-01-31 ENCOUNTER — Other Ambulatory Visit: Payer: Self-pay

## 2020-01-31 VITALS — BP 158/73 | HR 84 | Temp 97.5°F | Resp 18 | Ht 63.5 in | Wt 262.4 lb

## 2020-01-31 DIAGNOSIS — R319 Hematuria, unspecified: Secondary | ICD-10-CM | POA: Diagnosis not present

## 2020-01-31 DIAGNOSIS — R109 Unspecified abdominal pain: Secondary | ICD-10-CM | POA: Diagnosis not present

## 2020-01-31 DIAGNOSIS — R399 Unspecified symptoms and signs involving the genitourinary system: Secondary | ICD-10-CM | POA: Diagnosis not present

## 2020-01-31 LAB — POC URINALSYSI DIPSTICK (AUTOMATED)
Bilirubin, UA: NEGATIVE
Glucose, UA: NEGATIVE
Ketones, UA: NEGATIVE
Leukocytes, UA: NEGATIVE
Nitrite, UA: NEGATIVE
Protein, UA: NEGATIVE
Spec Grav, UA: >=1.03 — AB (ref 1.010–1.025)
Urobilinogen, UA: 0.2 E.U./dL
pH, UA: 6 (ref 5.0–8.0)

## 2020-01-31 MED ORDER — CYCLOBENZAPRINE HCL 10 MG PO TABS: 10.0000 mg | ORAL_TABLET | Freq: Every evening | ORAL | 0 refills | Status: DC | PRN

## 2020-01-31 NOTE — Progress Notes (Signed)
Subjective:    Patient ID: Ann Walter, female    DOB: May 01, 1966, 54 y.o.   MRN: [id number]  DOS:  01/31/2020 Type of visit - description: Acute 6 days history of pain located at the left flank. The pain does not radiate, increased with certain movements, denies any recent fall or injury. The pain is a steady, does not come in waves. Other than that she has some nausea. The office visit was set up as "rule out" UTI but the patient does not think she has a kidney infection    Review of Systems No fever chills No abdominal pain No vaginal discharge or bleeding No dysuria, gross hematuria, difficulty urinating.  Past Medical History:  Diagnosis Date  . Allergy   . Benign hematuria 12/2009   WORKUP WITH DR. NESI WAS NEGATIVE  . Depression   . Diabetes mellitus without complication (HCC)    prediabetes per pt  . Heart murmur   . HTN (hypertension)   . Obesity   . UTI (urinary tract infection), uncomplicated     Past Surgical History:  Procedure Laterality Date  . CHOLECYSTECTOMY  10/2010  . CYSTECTOMY     x 2 from chest    Allergies as of 01/31/2020      Reactions   Wellbutrin [bupropion] Swelling   Throat swelling   Sulfonamide Derivatives Other (See Comments)   Unknown per pt, it's been a long time      Medication List       Accurate as of January 31, 2020 11:59 PM. If you have any questions, ask your nurse or doctor.        ALPRAZolam 0.5 MG tablet Commonly known as: XANAX Take 1 tablet by mouth three times daily as needed   benzonatate 100 MG capsule Commonly known as: TESSALON Take 1 capsule (100 mg total) by mouth 3 (three) times daily as needed for cough.   cyclobenzaprine 10 MG tablet Commonly known as: FLEXERIL Take 1 tablet (10 mg total) by mouth at bedtime as needed for muscle spasms. Started by: Kathlene November, MD   desvenlafaxine 50 MG 24 hr tablet Commonly known as: PRISTIQ Take 1 tablet (50 mg total) by mouth daily.   metFORMIN 500 MG  tablet Commonly known as: GLUCOPHAGE Take 1 tablet by mouth once daily   nabumetone 750 MG tablet Commonly known as: RELAFEN Take 1 tablet (750 mg total) by mouth 2 (two) times daily as needed.   telmisartan 80 MG tablet Commonly known as: MICARDIS Take 1 tablet by mouth once daily   tiZANidine 4 MG tablet Commonly known as: ZANAFLEX TAKE 1 TABLET BY MOUTH AT BEDTIME   traMADol 50 MG tablet Commonly known as: ULTRAM TAKE 1 TO 2 TABLETS BY MOUTH EVERY 6 HOURS AS NEEDED          Objective:   Physical Exam BP (!) 158/73 (BP Location: Left Arm, Patient Position: Sitting, Cuff Size: Normal)   Pulse 84   Temp (!) 97.5 F (36.4 C) (Temporal)   Resp 18   Ht 5' 3.5" (1.613 m)   Wt 262 lb 6 oz (119 kg)   SpO2 99%   BMI 45.75 kg/m  General:   Well developed, NAD, BMI noted.  HEENT:  Normocephalic . Face symmetric, atraumatic Lungs:  CTA B Normal respiratory effort, no intercostal retractions, no accessory muscle use. Heart: RRR,  no murmur.  Abdomen:  Not distended, soft, non-tender. No rebound or rigidity. MSK: Slightly TTP at the left paraspinal  muscles around T12, L1.  Question of CVA tenderness on the left. Skin: Not pale. Not jaundice.  No rash Lower extremities: no pretibial edema bilaterally  Neurologic:  alert & oriented X3.  Speech normal, gait appropriate for age and unassisted Psych--  Cognition and judgment appear intact.  Cooperative with normal attention span and concentration.  Behavior appropriate. No anxious or depressed appearing.      Assessment    54 year old female, PMH includes prediabetes, morbid obesity, anxiety, depression, history of benign hematuria (w/  previous work-up 2011) presents with  Left flank pain: On clinical grounds it is hard to say if this is a MSK problem, a UTI or some other kidney problem (stones), early shingles, etc. Udip + for blood however she has a history of microscopic hematuria Plan: Get a UA urine culture,  treat pain as a MSK issue (Tylenol/ibuprofen/Flexeril/heating pad). If no better, she will let us know, may need further evaluation.  She is concerned about her family history of kidney cancer (uncle)   This visit occurred during the SARS-CoV-2 public health emergency.  Safety protocols were in place, including screening questions prior to the visit, additional usage of staff PPE, and extensive cleaning of exam room while observing appropriate contact time as indicated for disinfecting solutions.

## 2020-01-31 NOTE — Progress Notes (Signed)
Pre visit review using our clinic review tool, if applicable. No additional management support is needed unless otherwise documented below in the visit note. 

## 2020-01-31 NOTE — Patient Instructions (Addendum)
Take Flexeril at bedtime, is a muscle relaxant, may cause drowsiness.  For pain : alternate Tylenol ibuprofen  Tylenol  500 mg OTC 2 tabs a day every 8 hours as needed for pain  IBUPROFEN (Advil or Motrin) 200 mg 2 tablets every 8 hours as needed for pain.  Always take it with food because may cause gastritis and ulcers.  If you notice nausea, stomach pain, change in the color of stools --->  Stop the medicine and let us know  Heating pad twice a day  Call if not gradually better  Call if severe symptoms or if he had fever, chills, rash.

## 2020-02-01 LAB — URINALYSIS, ROUTINE W REFLEX MICROSCOPIC
Bilirubin Urine: NEGATIVE
Ketones, ur: NEGATIVE
Leukocytes,Ua: NEGATIVE
Nitrite: NEGATIVE
Specific Gravity, Urine: 1.025 (ref 1.000–1.030)
Total Protein, Urine: NEGATIVE
Urine Glucose: NEGATIVE
Urobilinogen, UA: 0.2 (ref 0.0–1.0)
pH: 5.5 (ref 5.0–8.0)

## 2020-02-01 LAB — URINE CULTURE
MICRO NUMBER:: 10332189
Result:: NO GROWTH
SPECIMEN QUALITY:: ADEQUATE

## 2020-03-04 ENCOUNTER — Other Ambulatory Visit: Payer: Self-pay | Admitting: Family Medicine

## 2020-03-04 DIAGNOSIS — I10 Essential (primary) hypertension: Secondary | ICD-10-CM

## 2020-03-07 NOTE — Patient Instructions (Addendum)
Good to see you again today!  I would encourage you to have your COVID-19 vaccine ASAP if not done already  Please go to the ground floor to get an x-ray of your left foot.  Then you are set to go.  I sent a prescription for meloxicam which is an anti-inflammatory, you may use this as needed for pain and swelling.  Continue ice and elevation.  You also might want to pick up a "postop shoe" at [facility], this may be most comfortable for you to wear for now

## 2020-03-07 NOTE — Progress Notes (Signed)
[facility] [place], [place], [place] [address] [phone]  Date:  03/08/2020   Name:  Ann Walter   DOB:  [birth year]   MRN:  [id number]  PCP:  Darreld Mclean, MD    Chief Complaint: Foot Injury (left leg injury, swelling, sharp pain medial side)   History of Present Illness:  Ann Walter is a 54 y.o. very pleasant female patient who presents with the following:  Patient with history of hypertension, prediabetes, obesity, depression and anxiety Here today with concern of a foot problem  Last seen by myself in October  Pap is up-to-date Mammogram up-to-date Colon cancer screen up-to-date COVID-19 vaccine- she states not interested, I encouraged her to get the Covid vaccine series which is safe and effective Shingrix Had complete labs in November-A1c 6.2 at that time  She notes that she hurt her left foot twice recently about 1 week ago-once she stepped in a hole in her yard, and then the next day hit her foot against a pole in a parking lot The foot it did swell and had some bruising along the medial foot and lower ankle, the bruising is now basically resolved  She has tried Voltaren gel and elevated her foot She is most comfortable in her house slippers, it can be difficult to stay on her feet all day at work  Otherwise she is on hurt and has no other major concerns today    Patient Active Problem List   Diagnosis Date Noted  . Left shoulder pain 12/22/2017  . Upper airway cough syndrome 09/14/2017  . Pre-diabetes 11/22/2015  . Morbid obesity due to excess calories ([place]) 11/21/2015  . Ovarian cyst 02/28/2015  . Chest pain, atypical 03/08/2014  . Heart murmur 03/08/2014  . Heart palpitations 03/08/2014  . GAD (generalized anxiety disorder) 02/13/2014  . Depression   . Essential hypertension     Past Medical History:  Diagnosis Date  . Allergy   . Benign hematuria 12/2009   WORKUP  WITH DR. NESI WAS NEGATIVE  . Depression   . Diabetes mellitus without complication (HCC)    prediabetes per pt  . Heart murmur   . HTN (hypertension)   . Obesity   . UTI (urinary tract infection), uncomplicated     Past Surgical History:  Procedure Laterality Date  . CHOLECYSTECTOMY  10/2010  . CYSTECTOMY     x 2 from chest    Social History   Tobacco Use  . Smoking status: Never Smoker  . Smokeless tobacco: Never Used  Substance Use Topics  . Alcohol use: Yes    Alcohol/week: 0.0 standard drinks    Comment: occasionally  . Drug use: No    Family History  Problem Relation Age of Onset  . Diabetes Mother   . Heart disease Mother        HAD TRIPLE BYPASS  . Hypertension Mother   . Kidney disease Father   . Breast cancer Sister   . Breast cancer Maternal Grandmother   . Cancer Maternal Grandmother        LYMPH NODE CANCER  . Cancer Maternal Aunt        COLORECTAL   . COPD Brother   . Kidney failure Brother   . Colon cancer Neg Hx     Allergies  Allergen Reactions  . Wellbutrin [Bupropion] Swelling    Throat swelling   . Sulfonamide Derivatives Other (See Comments)  Unknown per pt, it's been a long time    Medication list has been reviewed and updated.  Current Outpatient Medications on File Prior to Visit  Medication Sig Dispense Refill  . ALPRAZolam (XANAX) 0.5 MG tablet Take 1 tablet by mouth three times daily as needed 90 tablet 2  . benzonatate (TESSALON) 100 MG capsule Take 1 capsule (100 mg total) by mouth 3 (three) times daily as needed for cough. 60 capsule 1  . cyclobenzaprine (FLEXERIL) 10 MG tablet Take 1 tablet (10 mg total) by mouth at bedtime as needed for muscle spasms. 14 tablet 0  . desvenlafaxine (PRISTIQ) 50 MG 24 hr tablet Take 1 tablet (50 mg total) by mouth daily. 90 tablet 3  . metFORMIN (GLUCOPHAGE) 500 MG tablet Take 1 tablet by mouth once daily 90 tablet 1  . telmisartan (MICARDIS) 80 MG tablet Take 1 tablet by mouth once  daily 90 tablet 1   No current facility-administered medications on file prior to visit.    Review of Systems:  As per HPI- otherwise negative.   Physical Examination: Vitals:   03/08/20 1447  BP: 122/80  Pulse: 96  Resp: 18  Temp: 97.8 F (36.6 C)  SpO2: 97%   Vitals:   03/08/20 1447  Weight: 257 lb (116.6 kg)  Height: 5' 3.5" (1.613 m)   Body mass index is 44.81 kg/m. Ideal Body Weight: Weight in (lb) to have BMI = 25: 143.1  GEN: no acute distress.  Obese, looks well  HEENT: Atraumatic, Normocephalic.  Ears and Nose: No external deformity. CV: RRR, No M/G/R. No JVD. No thrill. No extra heart sounds. PULM: CTA B, no wheezes, crackles, rhonchi. No retractions. No resp. distress. No accessory muscle use. ABD: S, NT, ND, +BS. No rebound. No HSM. EXTR: No c/c/e PSYCH: Normally interactive. Conversant.  Left foot: Knee and tibia are negative. Patient has tenderness along the medial foot-suspect she may have a mild sprain of one of the tendons connecting the talus and tibia to associated foot bones No specific bony tenderness is noted No bruising Metatarsals are negative Calcaneus is negative Achilles intact Patient has mild discomfort with range of motion of her ankle  Assessment and Plan: Contusion of left foot, initial encounter - Plan: DG Foot Complete Left, meloxicam (MOBIC) 7.5 MG tablet ] Patient here today with an injury to her left foot.  She injured it twice about a week ago, once by stepping in a hole and then biking against a pole.  I suspect she has a medial low ankle sprain.  We will obtain plain films of her ankle to ensure no fracture.  Assuming this is okay, I suggested that she use an ankle support or postop shoe as needed, continue to ice and elevate.  Prescribed meloxicam to use as needed for pain I will be in touch with her pending her x-ray reports, she will let me know if any problems in the meantime Moderate medical decision making today  This  visit occurred during the SARS-CoV-2 public health emergency.  Safety protocols were in place, including screening questions prior to the visit, additional usage of staff PPE, and extensive cleaning of exam room while observing appropriate contact time as indicated for disinfecting solutions.    Signed Abbe Amsterdam, MD

## 2020-03-08 ENCOUNTER — Ambulatory Visit (HOSPITAL_BASED_OUTPATIENT_CLINIC_OR_DEPARTMENT_OTHER)
Admission: RE | Admit: 2020-03-08 | Discharge: 2020-03-08 | Disposition: A | Discharge status: Home / Self Care | Payer: BC Managed Care – PPO | Source: Ambulatory Visit | Attending: Family Medicine | Admitting: Family Medicine

## 2020-03-08 ENCOUNTER — Encounter: Payer: Self-pay | Admitting: Family Medicine

## 2020-03-08 ENCOUNTER — Other Ambulatory Visit: Payer: Self-pay

## 2020-03-08 ENCOUNTER — Ambulatory Visit: Payer: BC Managed Care – PPO | Admitting: Family Medicine

## 2020-03-08 VITALS — BP 122/80 | HR 96 | Temp 97.8°F | Resp 18 | Ht 63.5 in | Wt 257.0 lb

## 2020-03-08 DIAGNOSIS — S9032XA Contusion of left foot, initial encounter: Secondary | ICD-10-CM | POA: Diagnosis not present

## 2020-03-08 DIAGNOSIS — S99922A Unspecified injury of left foot, initial encounter: Secondary | ICD-10-CM | POA: Diagnosis not present

## 2020-03-08 MED ORDER — MELOXICAM 7.5 MG PO TABS: 7.5000 mg | ORAL_TABLET | Freq: Every day | ORAL | 0 refills | Status: DC

## 2020-03-09 ENCOUNTER — Encounter: Payer: Self-pay | Admitting: Family Medicine

## 2020-03-15 ENCOUNTER — Other Ambulatory Visit: Payer: Self-pay

## 2020-03-15 ENCOUNTER — Ambulatory Visit: Payer: BC Managed Care – PPO

## 2020-03-15 ENCOUNTER — Ambulatory Visit: Payer: BC Managed Care – PPO | Admitting: Physician Assistant

## 2020-03-15 ENCOUNTER — Encounter: Payer: Self-pay | Admitting: Physician Assistant

## 2020-03-15 VITALS — Ht 63.0 in | Wt 257.0 lb

## 2020-03-15 DIAGNOSIS — M5442 Lumbago with sciatica, left side: Secondary | ICD-10-CM | POA: Diagnosis not present

## 2020-03-15 MED ORDER — PREDNISONE 10 MG PO TABS: 20.0000 mg | ORAL_TABLET | Freq: Every day | ORAL | 1 refills | Status: DC

## 2020-03-15 NOTE — Progress Notes (Signed)
Office Visit Note   Patient: Ann Walter           Date of Birth: [birth year]           MRN: [id number] Visit Date: 03/15/2020              Requested by: Darreld Mclean, MD [address] [place],  [place] [address] PCP: Darreld Mclean, MD  Chief Complaint  Patient presents with  . Lower Back - Pain      HPI: This is a pleasant 54 year old woman with a 1 week history of buttock pain shooting down the back of her leg.  She also has feeling of heaviness at times in both of her calfs.  She denies any injury but has had a sprained left ankle which may have altered her gait.  She has tried Mobic and ibuprofen without relief in symptoms. Denies any weakness paresthesias , fever or chills. No previous history of back issues  Assessment & Plan: Visit Diagnoses:  1. Acute left-sided low back pain with left-sided sciatica     Plan: I had a long conversation with the patient.  We will again begin prednisone 20 mg 1 daily.  She will follow-up with me in 3 weeks.  I did asked that if she has any increasing symptoms or does not have any relief to call me in 1 week and we will arrange for possible epidural steroid injections  Follow-Up Instructions: No follow-ups on file.   Ortho Exam  Patient is alert, oriented, no adenopathy, well-dressed, normal affect, normal respiratory effort. Focused examination she has focal tenderness down into her left buttock.  She has good strength with plantar flexion dorsiflexion knee extension and flexion.  Positive straight leg raise on the left.  She prefers standing as transitions are painful for her.  No paresthesias no weakness  Imaging: No results found. No images are attached to the encounter.  Labs: Lab Results  Component Value Date   HGBA1C 6.2 09/01/2019   HGBA1C 6.2 02/22/2018   HGBA1C 5.9 04/27/2017   REPTSTATUS 03/27/2018 FINAL 03/25/2018   CULT (A) 03/25/2018    <10,000 COLONIES/mL INSIGNIFICANT GROWTH Performed at  [facility], [place] [address] [place], [place] [address]      Lab Results  Component Value Date   ALBUMIN 4.2 09/01/2019   ALBUMIN 3.8 03/25/2018   ALBUMIN 4.0 02/22/2018    No results found for: MG No results found for: VD25OH  No results found for: PREALBUMIN CBC EXTENDED Latest Ref Rng & Units 09/01/2019 03/25/2018 02/22/2018  WBC 4.0 - 10.5 K/uL 6.4 6.4 5.7  RBC 3.87 - 5.11 Mil/uL 4.30 4.37 4.27  HGB 12.0 - 15.0 g/dL 12.8 13.3 12.8  HCT 36.0 - 46.0 % 38.7 38.5 38.0  PLT 150.0 - 400.0 K/uL 236.0 249 283.0  NEUTROABS 1.7 - 7.7 K/uL - - -  LYMPHSABS 0.7 - 4.0 K/uL - - -     Body mass index is 45.53 kg/m.  Orders:  Orders Placed This Encounter  Procedures  . XR Lumbar Spine 2-3 Views   Meds ordered this encounter  Medications  . predniSONE (DELTASONE) 10 MG tablet    Sig: Take 2 tablets (20 mg total) by mouth daily with breakfast.    Dispense:  60 tablet    Refill:  1     Procedures: No procedures performed  Clinical Data: No additional findings.  ROS:  All other systems negative, except as noted  in the HPI. Review of Systems  Objective: Vital Signs: Ht 5\' 3"  (1.6 m)   Wt 257 lb (116.6 kg)   LMP  (LMP Unknown) Comment: pt states she is not have periods  BMI 45.53 kg/m   Specialty Comments:  No specialty comments available.  PMFS History: Patient Active Problem List   Diagnosis Date Noted  . Left shoulder pain 12/22/2017  . Upper airway cough syndrome 09/14/2017  . Pre-diabetes 11/22/2015  . Morbid obesity due to excess calories (HCC) 11/21/2015  . Ovarian cyst 02/28/2015  . Chest pain, atypical 03/08/2014  . Heart murmur 03/08/2014  . Heart palpitations 03/08/2014  . GAD (generalized anxiety disorder) 02/13/2014  . Depression   . Essential hypertension    Past Medical History:  Diagnosis Date  . Allergy   . Benign hematuria 12/2009   WORKUP WITH DR. NESI WAS NEGATIVE  . Depression   . Diabetes mellitus without complication (HCC)     prediabetes per pt  . Heart murmur   . HTN (hypertension)   . Obesity   . UTI (urinary tract infection), uncomplicated     Family History  Problem Relation Age of Onset  . Diabetes Mother   . Heart disease Mother        HAD TRIPLE BYPASS  . Hypertension Mother   . Kidney disease Father   . Breast cancer Sister   . Breast cancer Maternal Grandmother   . Cancer Maternal Grandmother        LYMPH NODE CANCER  . Cancer Maternal Aunt        COLORECTAL   . COPD Brother   . Kidney failure Brother   . Colon cancer Neg Hx     Past Surgical History:  Procedure Laterality Date  . CHOLECYSTECTOMY  10/2010  . CYSTECTOMY     x 2 from chest   Social History   Occupational History  . Occupation: 11/2010: [employer]  . Occupation: HAZARD/FLOOD    Employer: [employer]  Tobacco Use  . Smoking status: Never Smoker  . Smokeless tobacco: Never Used  Substance and Sexual Activity  . Alcohol use: Yes    Alcohol/week: 0.0 standard drinks    Comment: occasionally  . Drug use: No  . Sexual activity: Yes    Partners: Male    Birth control/protection: Pill    Comment: INTERCOURSE AGE 4, SEXUAL PARTNERS MORE THAN 4

## 2020-04-05 ENCOUNTER — Ambulatory Visit: Payer: BC Managed Care – PPO | Admitting: Physician Assistant

## 2020-04-11 ENCOUNTER — Other Ambulatory Visit: Payer: Self-pay | Admitting: Family Medicine

## 2020-04-11 DIAGNOSIS — F411 Generalized anxiety disorder: Secondary | ICD-10-CM

## 2020-04-11 NOTE — Telephone Encounter (Signed)
Requesting: Alprazolam Contract: none UDS: none Last Visit: 03/08/2020 Next Visit: none scheduled Last Refill: 01/09/2020 2 refills (June 02/2020)  Please Advise

## 2020-05-14 ENCOUNTER — Other Ambulatory Visit: Payer: Self-pay | Admitting: Family Medicine

## 2020-05-14 DIAGNOSIS — F411 Generalized anxiety disorder: Secondary | ICD-10-CM

## 2020-06-01 ENCOUNTER — Other Ambulatory Visit: Payer: Self-pay | Admitting: Family Medicine

## 2020-06-01 DIAGNOSIS — R7303 Prediabetes: Secondary | ICD-10-CM

## 2020-06-21 ENCOUNTER — Encounter: Payer: Self-pay | Admitting: Family Medicine

## 2020-08-07 NOTE — Progress Notes (Addendum)
[facility] at [facility] [address], [address] [place], [place] [address] [phone]  Date:  08/09/2020   Name:  Ann Walter   DOB:  Jul 16, 1966   MRN:  [id number]  PCP:  Pearline Cables, MD    Chief Complaint: Weight Loss (trouble losing weight), Knee Pain (left knee pain), and Flu Vaccine   History of Present Illness:  Ann Walter is a 54 y.o. very pleasant female patient who presents with the following:  Following up today with concern of weight loss- history of hypertension, prediabetes, obesity, depression and anxiety She notes that her work called and upset her on he way here today - she thinks this is why her pulse and BP are up a bit   Last seen by myself in May- she contacted me recently to talk about medication to help with weight loss So far she has tried changing her diet, more exercise Her weight is quite stable. She notes she has been overweight since her early 16s   Hep C screening- will do today  covid vaccine- encouraged her to do this asap, so far she has declined  Flu vaccine- give today  Pap UTD mammo UTD Due for routine labs today   BP Readings from Last 3 Encounters:  08/09/20 (!) 146/100  03/08/20 122/80  01/31/20 (!) 158/73     Wt Readings from Last 3 Encounters:  08/09/20 257 lb (116.6 kg)  03/15/20 257 lb (116.6 kg)  03/08/20 257 lb (116.6 kg)    Patient Active Problem List   Diagnosis Date Noted  . Left shoulder pain 12/22/2017  . Upper airway cough syndrome 09/14/2017  . Pre-diabetes 11/22/2015  . Morbid obesity due to excess calories (HCC) 11/21/2015  . Ovarian cyst 02/28/2015  . Chest pain, atypical 03/08/2014  . Heart murmur 03/08/2014  . Heart palpitations 03/08/2014  . GAD (generalized anxiety disorder) 02/13/2014  . Depression   . Essential hypertension     Past Medical History:  Diagnosis Date  . Allergy   . Benign hematuria 12/2009   WORKUP WITH DR. NESI WAS NEGATIVE   . Depression   . Diabetes mellitus without complication (HCC)    prediabetes per pt  . Heart murmur   . HTN (hypertension)   . Obesity   . UTI (urinary tract infection), uncomplicated     Past Surgical History:  Procedure Laterality Date  . CHOLECYSTECTOMY  10/2010  . CYSTECTOMY     x 2 from chest    Social History   Tobacco Use  . Smoking status: Never Smoker  . Smokeless tobacco: Never Used  Vaping Use  . Vaping Use: Never used  Substance Use Topics  . Alcohol use: Yes    Alcohol/week: 0.0 standard drinks    Comment: occasionally  . Drug use: No    Family History  Problem Relation Age of Onset  . Diabetes Mother   . Heart disease Mother        HAD TRIPLE BYPASS  . Hypertension Mother   . Kidney disease Father   . Breast cancer Sister   . Breast cancer Maternal Grandmother   . Cancer Maternal Grandmother        LYMPH NODE CANCER  . Cancer Maternal Aunt        COLORECTAL   . COPD Brother   . Kidney failure Brother   . Colon cancer Neg Hx     Allergies  Allergen Reactions  .  Wellbutrin [Bupropion] Swelling    Throat swelling   . Sulfonamide Derivatives Other (See Comments)    Unknown per pt, it's been a long time    Medication list has been reviewed and updated.  Current Outpatient Medications on File Prior to Visit  Medication Sig Dispense Refill  . ALPRAZolam (XANAX) 0.5 MG tablet Take 1 tablet by mouth three times daily as needed 90 tablet 3  . desvenlafaxine (PRISTIQ) 50 MG 24 hr tablet Take 1 tablet (50 mg total) by mouth daily. 90 tablet 3  . metFORMIN (GLUCOPHAGE) 500 MG tablet Take 1 tablet by mouth once daily 90 tablet 0  . telmisartan (MICARDIS) 80 MG tablet Take 1 tablet by mouth once daily 90 tablet 1   No current facility-administered medications on file prior to visit.    Review of Systems:  As per HPI- otherwise negative.   Physical Examination: Vitals:   08/09/20 1054  BP: (!) 146/100  Pulse: 78  Resp: 18  SpO2: 99%    Vitals:   08/09/20 1054  Weight: 257 lb (116.6 kg)  Height: 5\' 3"  (1.6 m)   Body mass index is 45.53 kg/m. Ideal Body Weight: Weight in (lb) to have BMI = 25: 140.8  GEN: no acute distress. Obese, otherwise looks well  HEENT: Atraumatic, Normocephalic.  Ears and Nose: No external deformity. CV: RRR, No M/G/R. No JVD. No thrill. No extra heart sounds. PULM: CTA B, no wheezes, crackles, rhonchi. No retractions. No resp. distress. No accessory muscle use. ABD: S, NT, ND, +BS. No rebound. No HSM. EXTR: No c/c/e PSYCH: Normally interactive. Conversant.    Assessment and Plan: Essential hypertension - Plan: CBC, Comprehensive metabolic panel  Pre-diabetes - Plan: Comprehensive metabolic panel, Hemoglobin A1c  Screening for thyroid disorder - Plan: TSH  Screening for hyperlipidemia - Plan: Lipid panel  Encounter for hepatitis C screening test for low risk patient - Plan: Hepatitis C antibody  Morbid obesity due to excess calories (HCC)  Morbid obesity (HCC) - Plan: Liraglutide -Weight Management (SAXENDA) 18 MG/3ML SOPN, Insulin Pen Needle (PEN NEEDLES) 32G X 5 MM MISC  Flu vaccine need - Plan: Flu Vaccine QUAD 36+ mos IM  Patient today for follow-up visit Blood pressure is a bit high today, but she notes she got very upset on the way in today.  She will monitor her blood pressure at home and let me know if he continues to run high Flu shot given Routine labs I encouraged her to get her COVID-19 vaccine to soon as possible, she will think about this We discussed potentially using a weight loss medication.  She would like to use Saxenda if we can get it covered by insurance.  She does not have any apparent contraindication.  I prescribed this medication and we discussed how to use it.  She will let me know if she is able to obtain it from the pharmacy Will plan further follow- up pending labs.  This visit occurred during the SARS-CoV-2 public health emergency.  Safety  protocols were in place, including screening questions prior to the visit, additional usage of staff PPE, and extensive cleaning of exam room while observing appropriate contact time as indicated for disinfecting solutions.    Signed , MD  Received her labs 10/15, message to patient Results for orders placed or performed in visit on 08/09/20  CBC  Result Value Ref Range   WBC 7.1 3.8 - 10.8 Thousand/uL   RBC 4.32 3.80 - 5.10 Million/uL  Hemoglobin 13.1 11.7 - 15.5 g/dL   HCT 02.7 35 - 45 %   MCV 89.8 80.0 - 100.0 fL   MCH 30.3 27.0 - 33.0 pg   MCHC 33.8 32.0 - 36.0 g/dL   RDW 25.3 66.4 - 40.3 %   Platelets 270 140 - 400 Thousand/uL   MPV 10.9 7.5 - 12.5 fL  Comprehensive metabolic panel  Result Value Ref Range   Glucose, Bld 97 65 - 99 mg/dL   BUN 14 7 - 25 mg/dL   Creat 4.74 2.59 - 5.63 mg/dL   BUN/Creatinine Ratio NOT APPLICABLE 6 - 22 (calc)   Sodium 138 135 - 146 mmol/L   Potassium 4.2 3.5 - 5.3 mmol/L   Chloride 104 98 - 110 mmol/L   CO2 25 20 - 32 mmol/L   Calcium 9.3 8.6 - 10.4 mg/dL   Total Protein 6.7 6.1 - 8.1 g/dL   Albumin 4.3 3.6 - 5.1 g/dL   Globulin 2.4 1.9 - 3.7 g/dL (calc)   AG Ratio 1.8 1.0 - 2.5 (calc)   Total Bilirubin 0.7 0.2 - 1.2 mg/dL   Alkaline phosphatase (APISO) 107 37 - 153 U/L   AST 16 10 - 35 U/L   ALT 14 6 - 29 U/L  Hemoglobin A1c  Result Value Ref Range   Hgb A1c MFr Bld 6.2 (H) <5.7 % of total Hgb   Mean Plasma Glucose 131 (calc)   eAG (mmol/L) 7.3 (calc)  Lipid panel  Result Value Ref Range   Cholesterol 159 <200 mg/dL   HDL 64 > OR = 50 mg/dL   Triglycerides 875 <643 mg/dL   LDL Cholesterol (Calc) 74 mg/dL (calc)   Total CHOL/HDL Ratio 2.5 <5.0 (calc)   Non-HDL Cholesterol (Calc) 95 <329 mg/dL (calc)  TSH  Result Value Ref Range   TSH 2.02 mIU/L

## 2020-08-09 ENCOUNTER — Telehealth: Payer: Self-pay

## 2020-08-09 ENCOUNTER — Ambulatory Visit: Payer: BC Managed Care – PPO | Admitting: Family Medicine

## 2020-08-09 ENCOUNTER — Encounter: Payer: Self-pay | Admitting: Family Medicine

## 2020-08-09 ENCOUNTER — Other Ambulatory Visit: Payer: Self-pay

## 2020-08-09 VITALS — BP 146/100 | HR 78 | Resp 18 | Ht 63.0 in | Wt 257.0 lb

## 2020-08-09 DIAGNOSIS — Z23 Encounter for immunization: Secondary | ICD-10-CM | POA: Diagnosis not present

## 2020-08-09 DIAGNOSIS — Z1159 Encounter for screening for other viral diseases: Secondary | ICD-10-CM | POA: Diagnosis not present

## 2020-08-09 DIAGNOSIS — I10 Essential (primary) hypertension: Secondary | ICD-10-CM

## 2020-08-09 DIAGNOSIS — R7303 Prediabetes: Secondary | ICD-10-CM | POA: Diagnosis not present

## 2020-08-09 DIAGNOSIS — Z13220 Encounter for screening for lipoid disorders: Secondary | ICD-10-CM

## 2020-08-09 DIAGNOSIS — Z1329 Encounter for screening for other suspected endocrine disorder: Secondary | ICD-10-CM | POA: Diagnosis not present

## 2020-08-09 MED ORDER — PEN NEEDLES 32G X 5 MM MISC: 3 refills | Status: DC

## 2020-08-09 MED ORDER — SAXENDA 18 MG/3ML ~~LOC~~ SOPN: 0.6000 mg | PEN_INJECTOR | Freq: Every day | SUBCUTANEOUS | 3 refills | Status: DC

## 2020-08-09 NOTE — Telephone Encounter (Signed)
PA initiated via Covermymeds; KEY: [id number]. PA denied.   Weight loss treatments, including but not limited to medications, diet supplements, and surgeries outside the scope of the [facility] of [facility] program, are not covered. Please contact [employer] at [phone] for further assistance.

## 2020-08-09 NOTE — Patient Instructions (Signed)
It was good to see you again today- take care and I hope that you are able to get the Saxenda rx- let me know how this goes for you You got your flu shot today I do encourage you to get your covid 19 vaccine asap- I sincerely believe these shots are safe (at least much safer than getting covid) and are our best way to keep you out of the hospital with covid pneumonia.  I will be in touch with your flu shot Please see me in about 3 months to check on how the Saxenda is working for you, assuming you are able to get it

## 2020-08-10 ENCOUNTER — Encounter: Payer: Self-pay | Admitting: Family Medicine

## 2020-08-10 LAB — CBC
HCT: 38.8 % (ref 35.0–45.0)
Hemoglobin: 13.1 g/dL (ref 11.7–15.5)
MCH: 30.3 pg (ref 27.0–33.0)
MCHC: 33.8 g/dL (ref 32.0–36.0)
MCV: 89.8 fL (ref 80.0–100.0)
MPV: 10.9 fL (ref 7.5–12.5)
Platelets: 270 10*3/uL (ref 140–400)
RBC: 4.32 10*6/uL (ref 3.80–5.10)
RDW: 12.8 % (ref 11.0–15.0)
WBC: 7.1 10*3/uL (ref 3.8–10.8)

## 2020-08-10 LAB — COMPREHENSIVE METABOLIC PANEL
AG Ratio: 1.8 (calc) (ref 1.0–2.5)
ALT: 14 U/L (ref 6–29)
AST: 16 U/L (ref 10–35)
Albumin: 4.3 g/dL (ref 3.6–5.1)
Alkaline phosphatase (APISO): 107 U/L (ref 37–153)
BUN: 14 mg/dL (ref 7–25)
CO2: 25 mmol/L (ref 20–32)
Calcium: 9.3 mg/dL (ref 8.6–10.4)
Chloride: 104 mmol/L (ref 98–110)
Creat: 0.74 mg/dL (ref 0.50–1.05)
Globulin: 2.4 g/dL (calc) (ref 1.9–3.7)
Glucose, Bld: 97 mg/dL (ref 65–99)
Potassium: 4.2 mmol/L (ref 3.5–5.3)
Sodium: 138 mmol/L (ref 135–146)
Total Bilirubin: 0.7 mg/dL (ref 0.2–1.2)
Total Protein: 6.7 g/dL (ref 6.1–8.1)

## 2020-08-10 LAB — LIPID PANEL
Cholesterol: 159 mg/dL (ref <200)
HDL: 64 mg/dL (ref >=50)
LDL Cholesterol (Calc): 74 mg/dL (calc)
Non-HDL Cholesterol (Calc): 95 mg/dL (calc) (ref <130)
Total CHOL/HDL Ratio: 2.5 (calc) (ref <5.0)
Triglycerides: 133 mg/dL (ref <150)

## 2020-08-10 LAB — HEPATITIS C ANTIBODY
Hepatitis C Ab: NONREACTIVE
SIGNAL TO CUT-OFF: 0.01 (ref <1.00)

## 2020-08-10 LAB — HEMOGLOBIN A1C
Hgb A1c MFr Bld: 6.2 % of total Hgb — ABNORMAL HIGH (ref <5.7)
Mean Plasma Glucose: 131 (calc)
eAG (mmol/L): 7.3 (calc)

## 2020-08-10 LAB — TSH: TSH: 2.02 mIU/L

## 2020-08-15 ENCOUNTER — Encounter: Payer: Self-pay | Admitting: Family Medicine

## 2020-08-15 NOTE — Telephone Encounter (Signed)
Per previous documentation from [facility] her PA was denied.    "Weight loss treatments, including but not limited to medications, diet supplements, and surgeries outside the scope of the [facility] of [facility] program, are not covered. Please contact [employer] at [phone] for further assistance"   It doesn't seem as if any medications are covered under her plan. Is there alternative therapy for her?

## 2020-08-21 ENCOUNTER — Encounter: Payer: Self-pay | Admitting: Family Medicine

## 2020-08-21 NOTE — Telephone Encounter (Signed)
Please see previous message sent. Are there alternative options for patient?

## 2020-09-09 ENCOUNTER — Other Ambulatory Visit: Payer: Self-pay | Admitting: Family Medicine

## 2020-09-09 DIAGNOSIS — I10 Essential (primary) hypertension: Secondary | ICD-10-CM

## 2020-09-11 ENCOUNTER — Other Ambulatory Visit: Payer: Self-pay | Admitting: Family Medicine

## 2020-09-11 DIAGNOSIS — F411 Generalized anxiety disorder: Secondary | ICD-10-CM

## 2020-09-11 NOTE — Telephone Encounter (Signed)
Requesting: alprazolam 0.5mg  Contract: None UDS: None Last Visit: 08/09/2020 Next Visit: None scheduled Last Refill: 05/14/2020 #90 and 3RF Pt sig: 1 tab tid prn  Please Advise

## 2020-09-17 ENCOUNTER — Other Ambulatory Visit: Payer: Self-pay | Admitting: Family Medicine

## 2020-09-17 DIAGNOSIS — R7303 Prediabetes: Secondary | ICD-10-CM

## 2020-09-17 DIAGNOSIS — F32A Depression, unspecified: Secondary | ICD-10-CM

## 2020-09-17 DIAGNOSIS — F411 Generalized anxiety disorder: Secondary | ICD-10-CM

## 2020-10-31 ENCOUNTER — Encounter: Payer: Self-pay | Admitting: Internal Medicine

## 2020-10-31 ENCOUNTER — Ambulatory Visit: Payer: BC Managed Care – PPO | Admitting: Internal Medicine

## 2020-10-31 ENCOUNTER — Other Ambulatory Visit: Payer: Self-pay

## 2020-10-31 VITALS — BP 145/84 | HR 103 | Temp 98.2°F | Resp 18 | Ht 63.0 in | Wt 262.2 lb

## 2020-10-31 DIAGNOSIS — M2391 Unspecified internal derangement of right knee: Secondary | ICD-10-CM

## 2020-10-31 NOTE — Patient Instructions (Signed)
ICE  Tylenol  500 mg OTC 2 tabs a day every 8 hours as needed for pain  IBUPROFEN (Advil or Motrin) 200 mg 2 tablets every 12 hours as needed for pain.  Always take it with food because may cause gastritis and ulcers.  If you notice nausea, stomach pain, change in the color of stools --->  Stop the medicine and let us know  We are referring you to the orthopedic doctor

## 2020-10-31 NOTE — Progress Notes (Signed)
Pre visit review using our clinic review tool, if applicable. No additional management support is needed unless otherwise documented below in the visit note. 

## 2020-10-31 NOTE — Progress Notes (Signed)
Subjective:    Patient ID: Ann Walter, female    DOB: 1966/01/05, 55 y.o.   MRN: [id number]  DOS:  10/31/2020 Type of visit - description: Acute 2 weeks ago was walking on the yard, she stepped wrong, twisted her right leg, immediately developed pain at the anterior and inner aspect of the right knee. The knee was somewhat swollen. Pain increases with walking.  Is using a cane. The area was not red or hot.  Review of Systems See above   Past Medical History:  Diagnosis Date   Allergy    Benign hematuria 12/2009   WORKUP WITH DR. Brunilda Payor WAS NEGATIVE   Depression    Diabetes mellitus without complication (HCC)    prediabetes per pt   Heart murmur    HTN (hypertension)    Obesity    UTI (urinary tract infection), uncomplicated     Past Surgical History:  Procedure Laterality Date   CHOLECYSTECTOMY  10/2010   CYSTECTOMY     x 2 from chest    Allergies as of 10/31/2020      Reactions   Wellbutrin [bupropion] Swelling   Throat swelling   Sulfonamide Derivatives Other (See Comments)   Unknown per pt, it's been a long time      Medication List       Accurate as of October 31, 2020 11:59 PM. If you have any questions, ask your nurse or doctor.        ALPRAZolam 0.5 MG tablet Commonly known as: XANAX Take 1 tablet by mouth three times daily as needed   desvenlafaxine 50 MG 24 hr tablet Commonly known as: PRISTIQ Take 1 tablet (50 mg total) by mouth daily.   metFORMIN 500 MG tablet Commonly known as: GLUCOPHAGE Take 1 tablet (500 mg total) by mouth daily.   Pen Needles 32G X 5 MM Misc Use one daily for saxenda medication   Saxenda 18 MG/3ML Sopn Generic drug: Liraglutide -Weight Management Inject 0.6 mg into the skin daily. Increase by 0.6 mg weekly to a goal dose of 3 mg daily   telmisartan 80 MG tablet Commonly known as: MICARDIS Take 1 tablet (80 mg total) by mouth daily.          Objective:   Physical Exam BP (!) 145/84 (BP Location:  Left Arm, Patient Position: Sitting, Cuff Size: Normal)    Pulse (!) 103    Temp 98.2 F (36.8 C) (Oral)    Resp 18    Ht 5\' 3"  (1.6 m)    Wt 262 lb 4 oz (119 kg)    LMP  (LMP Unknown) Comment: pt states she is not have periods   SpO2 95%    BMI 46.46 kg/m  General:   Well developed, NAD, BMI noted. HEENT:  Normocephalic . Face symmetric, atraumatic MSK: L knee: Normal to inspection and palpation R knee: Range of motion is limited, specifically unable to fully flex the knee.  Small effusion?  No redness or warmness. Hyperflexion cause pain, drawer sign negative, McMurray positive? Lower extremities: no pretibial edema bilaterally  Skin: Not pale. Not jaundice Neurologic:  alert & oriented X3.  Speech normal, gait appropriate for age and unassisted Psych--  Cognition and judgment appear intact.  Cooperative with normal attention span and concentration.  Behavior appropriate. No anxious or depressed appearing.      Assessment       55 year old female, PMH includes prediabetes, morbid obesity, anxiety, depression, history of benign hematuria (w/  previous  work-up 2011) presents with  Acute right knee pain: In the context of twisting her leg, pain started immediately after the incident and the knee was somewhat swollen.  Suspect internal derrangment. Plan: Refer to Ortho, ice, Tylenol, judicious use of ibuprofen.  This visit occurred during the SARS-CoV-2 public health emergency.  Safety protocols were in place, including screening questions prior to the visit, additional usage of staff PPE, and extensive cleaning of exam room while observing appropriate contact time as indicated for disinfecting solutions.

## 2020-11-01 ENCOUNTER — Ambulatory Visit: Payer: Self-pay

## 2020-11-01 ENCOUNTER — Encounter: Payer: Self-pay | Admitting: Orthopedic Surgery

## 2020-11-01 ENCOUNTER — Ambulatory Visit: Payer: BC Managed Care – PPO | Admitting: Orthopedic Surgery

## 2020-11-01 DIAGNOSIS — M25561 Pain in right knee: Secondary | ICD-10-CM

## 2020-11-01 MED ORDER — METHYLPREDNISOLONE ACETATE 40 MG/ML IJ SUSP
40.0000 mg | INTRAMUSCULAR | Status: AC | PRN
  Administered 2020-11-01: 40 mg via INTRA_ARTICULAR

## 2020-11-01 MED ORDER — MELOXICAM 7.5 MG PO TABS: 7.5000 mg | ORAL_TABLET | Freq: Every day | ORAL | 1 refills | Status: DC

## 2020-11-01 MED ORDER — LIDOCAINE HCL 1 % IJ SOLN
5.0000 mL | INTRAMUSCULAR | Status: AC | PRN
  Administered 2020-11-01: 5 mL

## 2020-11-01 NOTE — Progress Notes (Signed)
Office Visit Note   Patient: Ann Walter           Date of Birth: 1966-09-16           MRN: [id number] Visit Date: 11/01/2020              Requested by: Pearline Cables, MD [address] [place],  [place] [address] PCP: Pearline Cables, MD  Chief Complaint  Patient presents with  . Right Knee - Pain      HPI: Patient is a pleasant 55 year old woman who is 2 weeks status post twisting her knee while walking in a field.  She had immediate pain mostly over the medial side of her knee.  She was able to ambulate after this.  Since then she has had pain which she focuses on the medial knee joint.  Does go down to the proximal tibia just a little bit.  She denies any instability but states is more painful symptoms.  Denies any locking or catching  Assessment & Plan: Visit Diagnoses:  1. Right knee pain, unspecified chronicity     Plan: Given the immense amount of pain she is having I am concerned for ligamentous or meniscal injury.  We will try an injection today and order an MRI.  She will follow-up once the MRI is complete hopefully in 3 weeks.  I have instructed her to try to get a knee support from a sporting goods cooler or from a local medical supply store.  I think she would do better on crutches than a cane.  We will also call her in a prescription for Mobic.  She understands not to take other anti-inflammatories with this  Follow-Up Instructions: No follow-ups on file.   Ortho Exam  Patient is alert, oriented, no adenopathy, well-dressed, normal affect, normal respiratory effort. Right knee no effusion.  Small amount of soft tissue swelling.  She is acutely tender over the anterior medial joint line.  She does able to extend her knee and resist.  Flexion causes her more pain.  Difficult to assess stability secondary to her guarding and circumference of her leg.  Imaging: XR Knee 1-2 Views Right  Result Date: 11/01/2020 X-rays of her knee overall  well-maintained alignment cannot appreciate any acute osseous injuries to the knee or tibia  No images are attached to the encounter.  Labs: Lab Results  Component Value Date   HGBA1C 6.2 (H) 08/09/2020   HGBA1C 6.2 09/01/2019   HGBA1C 6.2 02/22/2018   REPTSTATUS 03/27/2018 FINAL 03/25/2018   CULT (A) 03/25/2018    <10,000 COLONIES/mL INSIGNIFICANT GROWTH Performed at [facility], [address], [place], [place] [address]      Lab Results  Component Value Date   ALBUMIN 4.2 09/01/2019   ALBUMIN 3.8 03/25/2018   ALBUMIN 4.0 02/22/2018    No results found for: MG No results found for: VD25OH  No results found for: PREALBUMIN CBC EXTENDED Latest Ref Rng & Units 08/09/2020 09/01/2019 03/25/2018  WBC 3.8 - 10.8 Thousand/uL 7.1 6.4 6.4  RBC 3.80 - 5.10 Million/uL 4.32 4.30 4.37  HGB 11.7 - 15.5 g/dL 40.0 86.7 61.9  HCT 50.9 - 45.0 % 38.8 38.7 38.5  PLT 140 - 400 Thousand/uL 270 236.0 249  NEUTROABS 1.7 - 7.7 K/uL - - -  LYMPHSABS 0.7 - 4.0 K/uL - - -     There is no height or weight on file to calculate BMI.  Orders:  Orders  Placed This Encounter  Procedures  . XR Knee 1-2 Views Right  . MR Knee Right w/o contrast   No orders of the defined types were placed in this encounter.    Procedures: Large Joint Inj on 11/01/2020 2:44 PM Indications: pain and diagnostic evaluation Details: 22 G 1.5 in needle  Arthrogram: No  Medications: 40 mg methylPREDNISolone acetate 40 MG/ML; 5 mL lidocaine 1 % Outcome: tolerated well, no immediate complications Procedure, treatment alternatives, risks and benefits explained, specific risks discussed. Consent was given by the patient.      Clinical Data: No additional findings.  ROS:  All other systems negative, except as noted in the HPI. Review of Systems  Objective: Vital Signs: LMP  (LMP Unknown) Comment: pt states she is not have periods  Specialty Comments:  No specialty comments available.  PMFS  History: Patient Active Problem List   Diagnosis Date Noted  . Left shoulder pain 12/22/2017  . Upper airway cough syndrome 09/14/2017  . Pre-diabetes 11/22/2015  . Morbid obesity due to excess calories (HCC) 11/21/2015  . Ovarian cyst 02/28/2015  . Chest pain, atypical 03/08/2014  . Heart murmur 03/08/2014  . Heart palpitations 03/08/2014  . GAD (generalized anxiety disorder) 02/13/2014  . Depression   . Essential hypertension    Past Medical History:  Diagnosis Date  . Allergy   . Benign hematuria 12/2009   WORKUP WITH DR. NESI WAS NEGATIVE  . Depression   . Diabetes mellitus without complication (HCC)    prediabetes per pt  . Heart murmur   . HTN (hypertension)   . Obesity   . UTI (urinary tract infection), uncomplicated     Family History  Problem Relation Age of Onset  . Diabetes Mother   . Heart disease Mother        HAD TRIPLE BYPASS  . Hypertension Mother   . Kidney disease Father   . Breast cancer Sister   . Breast cancer Maternal Grandmother   . Cancer Maternal Grandmother        LYMPH NODE CANCER  . Cancer Maternal Aunt        COLORECTAL   . COPD Brother   . Kidney failure Brother   . Colon cancer Neg Hx     Past Surgical History:  Procedure Laterality Date  . CHOLECYSTECTOMY  10/2010  . CYSTECTOMY     x 2 from chest   Social History   Occupational History  . 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 Occupation: HAZARD/FLOOD    Employer: [employer]  Tobacco Use  . Smoking status: Never Smoker  . Smokeless tobacco: Never Used  Vaping Use  . Vaping Use: Never used  Substance and Sexual Activity  . Alcohol use: Yes    Alcohol/week: 0.0 standard drinks    Comment: occasionally  . Drug use: No  . Sexual activity: Yes    Partners: Male    Birth control/protection: Pill    Comment: INTERCOURSE AGE 63, SEXUAL PARTNERS MORE THAN 5

## 2020-11-05 ENCOUNTER — Encounter: Payer: Self-pay | Admitting: Orthopedic Surgery

## 2020-11-14 ENCOUNTER — Ambulatory Visit
Admission: RE | Admit: 2020-11-14 | Discharge: 2020-11-14 | Disposition: A | Discharge status: Home / Self Care | Payer: BC Managed Care – PPO | Source: Ambulatory Visit | Attending: Physician Assistant | Admitting: Physician Assistant

## 2020-11-14 ENCOUNTER — Other Ambulatory Visit: Payer: Self-pay

## 2020-11-14 DIAGNOSIS — M25561 Pain in right knee: Secondary | ICD-10-CM | POA: Diagnosis not present

## 2020-11-19 ENCOUNTER — Encounter: Payer: Self-pay | Admitting: Family Medicine

## 2020-11-19 ENCOUNTER — Encounter: Payer: Self-pay | Admitting: Orthopedic Surgery

## 2020-11-26 ENCOUNTER — Ambulatory Visit: Payer: BC Managed Care – PPO | Admitting: Orthopedic Surgery

## 2020-11-27 DIAGNOSIS — G4733 Obstructive sleep apnea (adult) (pediatric): Secondary | ICD-10-CM | POA: Diagnosis not present

## 2020-11-28 ENCOUNTER — Other Ambulatory Visit: Payer: Self-pay | Admitting: Family Medicine

## 2020-11-28 DIAGNOSIS — F32A Depression, unspecified: Secondary | ICD-10-CM

## 2020-11-28 DIAGNOSIS — R7303 Prediabetes: Secondary | ICD-10-CM

## 2020-11-28 DIAGNOSIS — F411 Generalized anxiety disorder: Secondary | ICD-10-CM

## 2020-12-03 ENCOUNTER — Other Ambulatory Visit: Payer: Self-pay

## 2020-12-03 ENCOUNTER — Encounter: Payer: Self-pay | Admitting: Family Medicine

## 2020-12-03 ENCOUNTER — Ambulatory Visit (HOSPITAL_BASED_OUTPATIENT_CLINIC_OR_DEPARTMENT_OTHER)
Admission: RE | Admit: 2020-12-03 | Discharge: 2020-12-03 | Disposition: A | Discharge status: Home / Self Care | Payer: BC Managed Care – PPO | Source: Ambulatory Visit | Attending: Family Medicine | Admitting: Family Medicine

## 2020-12-03 ENCOUNTER — Telehealth (INDEPENDENT_AMBULATORY_CARE_PROVIDER_SITE_OTHER): Payer: BC Managed Care – PPO | Admitting: Family Medicine

## 2020-12-03 VITALS — BP 148/96 | HR 89 | Resp 18 | Ht 63.0 in | Wt 266.0 lb

## 2020-12-03 DIAGNOSIS — U071 COVID-19: Secondary | ICD-10-CM

## 2020-12-03 DIAGNOSIS — I10 Essential (primary) hypertension: Secondary | ICD-10-CM | POA: Diagnosis not present

## 2020-12-03 DIAGNOSIS — R7303 Prediabetes: Secondary | ICD-10-CM

## 2020-12-03 DIAGNOSIS — R079 Chest pain, unspecified: Secondary | ICD-10-CM | POA: Diagnosis not present

## 2020-12-03 DIAGNOSIS — M2391 Unspecified internal derangement of right knee: Secondary | ICD-10-CM | POA: Diagnosis not present

## 2020-12-03 DIAGNOSIS — R110 Nausea: Secondary | ICD-10-CM

## 2020-12-03 DIAGNOSIS — R0789 Other chest pain: Secondary | ICD-10-CM

## 2020-12-03 LAB — TROPONIN I (HIGH SENSITIVITY): High Sens Troponin I: 5 ng/L (ref 2–17)

## 2020-12-03 MED ORDER — PREDNISONE 20 MG PO TABS: ORAL_TABLET | ORAL | 0 refills | Status: DC

## 2020-12-03 MED ORDER — ONDANSETRON HCL 8 MG PO TABS: 8.0000 mg | ORAL_TABLET | Freq: Three times a day (TID) | ORAL | 0 refills | Status: DC | PRN

## 2020-12-03 NOTE — Patient Instructions (Signed)
Good to see you today- I am sorry you are not feeling well We will treat you with prednisone for 8 days; hopefully this will clear up your lungs I will be in touch with your labs asap Please let me know if you are not feeling better in the next 2-3 days Once you are recovered please get a covid vaccine

## 2020-12-03 NOTE — Progress Notes (Signed)
[facility] [address], [address] [place], [place] [address] [phone]  Date:  12/03/2020   Name:  Ann Walter   DOB:  Jan 27, 1966   MRN:  [id number]  PCP:  Pearline Cables, MD    Chief Complaint: No chief complaint on file.   History of Present Illness:  Ann Walter is a 55 y.o. very pleasant female patient who presents with the following:  Virtual visit today for covid 19-however converted to in person visit due to patient concerns Last seen by myself in October-  history of hypertension, prediabetes, obesity, depression and anxiety Visit today for concern of recent COVID-19 illness.  She is unvaccinated Pt notes that she tested positive on 1/25- she had a bad headache, felt really tired, diarrhea, cough She is using OTC medications as needed for symptoms She went back to work about 5 days later and seemed to be doing ok- however at this point she feels that perhaps she is getting worse again She notes taht she is feeling tired and has aches across her back  She is not checking her temp at this time  She got re-tested this am but is waiting on this result  She notes some cough, improved from previous Her chest feels heavy and tight in the center  Lab Results  Component Value Date   HGBA1C 6.2 (H) 08/09/2020    She is seeing Dr Lajoyce Corners for her right knee pain- did not have surgery yet but she thinks this might be the plan    Patient Active Problem List   Diagnosis Date Noted  . Left shoulder pain 12/22/2017  . Upper airway cough syndrome 09/14/2017  . Pre-diabetes 11/22/2015  . Morbid obesity due to excess calories (HCC) 11/21/2015  . Ovarian cyst 02/28/2015  . Chest pain, atypical 03/08/2014  . Heart murmur 03/08/2014  . Heart palpitations 03/08/2014  . GAD (generalized anxiety disorder) 02/13/2014  . Depression   . Essential hypertension     Past Medical History:  Diagnosis Date  . Allergy   .  Benign hematuria 12/2009   WORKUP WITH DR. NESI WAS NEGATIVE  . Depression   . Diabetes mellitus without complication (HCC)    prediabetes per pt  . Heart murmur   . HTN (hypertension)   . Obesity   . UTI (urinary tract infection), uncomplicated     Past Surgical History:  Procedure Laterality Date  . CHOLECYSTECTOMY  10/2010  . CYSTECTOMY     x 2 from chest    Social History   Tobacco Use  . Smoking status: Never Smoker  . Smokeless tobacco: Never Used  Vaping Use  . Vaping Use: Never used  Substance Use Topics  . Alcohol use: Yes    Alcohol/week: 0.0 standard drinks    Comment: occasionally  . Drug use: No    Family History  Problem Relation Age of Onset  . Diabetes Mother   . Heart disease Mother        HAD TRIPLE BYPASS  . Hypertension Mother   . Kidney disease Father   . Breast cancer Sister   . Breast cancer Maternal Grandmother   . Cancer Maternal Grandmother        LYMPH NODE CANCER  . Cancer Maternal Aunt        COLORECTAL   . COPD Brother   . Kidney failure Brother   . Colon cancer Neg Hx  Allergies  Allergen Reactions  . Wellbutrin [Bupropion] Swelling    Throat swelling   . Sulfonamide Derivatives Other (See Comments)    Unknown per pt, it's been a long time    Medication list has been reviewed and updated.  Current Outpatient Medications on File Prior to Visit  Medication Sig Dispense Refill  . ALPRAZolam (XANAX) 0.5 MG tablet Take 1 tablet by mouth three times daily as needed 90 tablet 3  . desvenlafaxine (PRISTIQ) 50 MG 24 hr tablet Take 1 tablet by mouth once daily 90 tablet 0  . meloxicam (MOBIC) 7.5 MG tablet Take 1 tablet (7.5 mg total) by mouth daily. 30 tablet 1  . metFORMIN (GLUCOPHAGE) 500 MG tablet Take 1 tablet by mouth once daily 90 tablet 0  . telmisartan (MICARDIS) 80 MG tablet Take 1 tablet (80 mg total) by mouth daily. 90 tablet 1   No current facility-administered medications on file prior to visit.    Review of  Systems:  As per HPI- otherwise negative.   Physical Examination: Vitals:   12/03/20 1342  BP: (!) 148/96  Pulse: 89  Resp: 18  SpO2: 97%   Vitals:   12/03/20 1342  Weight: 266 lb (120.7 kg)  Height: 5\' 3"  (1.6 m)   Body mass index is 47.12 kg/m. Ideal Body Weight: Weight in (lb) to have BMI = 25: 140.8  GEN: no acute distress.  Obese, looks her normal self.  Occasionally tearful HEENT: Atraumatic, Normocephalic.  Ears and Nose: No external deformity. CV: RRR, No M/G/R. No JVD. No thrill. No extra heart sounds. PULM: CTA B, no wheezes, crackles, rhonchi. No retractions. No resp. distress. No accessory muscle use. ABD: S, NT, ND, +BS. No rebound. No HSM. EXTR: No c/c/e PSYCH: Normally interactive. Conversant.  Walking with cane due to right knee problem- seeing Dr for same   [facility] Chest 2 View  Result Date: 12/03/2020 CLINICAL DATA:  History of recent COVID infection.  Chest pain. EXAM: CHEST - 2 VIEW COMPARISON:  08/10/2017. FINDINGS: The cardiac silhouette, mediastinal and hilar contours are within normal limits. The lungs are clear. No infiltrates, edema or effusions. Mild eventration of both hemidiaphragms. No pulmonary lesions. The bony thorax is intact. IMPRESSION: No acute cardiopulmonary findings. Electronically Signed   By: 08/12/2017 M.D.   On: 12/03/2020 13:36   EKG: NSR Assessment and Plan: COVID-19 - Plan: DG Chest 2 View, EKG 12-Lead, predniSONE (DELTASONE) 20 MG tablet  Essential hypertension  Pre-diabetes - Plan: Hemoglobin A1c  Internal derangement of right knee  Chest tightness - Plan: Troponin I (High Sensitivity), Troponin I (High Sensitivity)  Nausea - Plan: ondansetron (ZOFRAN) 8 MG tablet  Patient today with prolonged symptoms following COVID-19.  She is unvaccinated so symptoms are expected to be more severe. Chest x-ray is clear, no sign of pneumonia.  We will start her on prednisone for airway irritability and chest congestion Also  called in Zofran to use as needed for nausea Blood pressure is a bit high today but patient is ill.  We will recheck when she is better Her knee is being treated by orthopedics She has had complaint of chest tightness today, EKG is normal.  Will obtain troponin, but suspect chest tightness is secondary to COVID-19 residual symptoms This visit occurred during the SARS-CoV-2 public health emergency.  Safety protocols were in place, including screening questions prior to the visit, additional usage of staff PPE, and extensive cleaning of exam room while observing appropriate contact time as  indicated for disinfecting solutions.    Signed Abbe Amsterdam, MD  Received her troponin as below, sent message to patient  Results for orders placed or performed in visit on 12/03/20  Troponin I (High Sensitivity)  Result Value Ref Range   High Sens Troponin I 5 2 - 17 ng/L   Received her A1c- looks ok Message to pt  Results for orders placed or performed in visit on 12/03/20  Hemoglobin A1c  Result Value Ref Range   Hgb A1c MFr Bld 6.3 4.6 - 6.5 %  Troponin I (High Sensitivity)  Result Value Ref Range   High Sens Troponin I 5 2 - 17 ng/L

## 2020-12-04 ENCOUNTER — Encounter: Payer: Self-pay | Admitting: Family Medicine

## 2020-12-04 LAB — HEMOGLOBIN A1C: Hgb A1c MFr Bld: 6.3 % (ref 4.6–6.5)

## 2020-12-06 ENCOUNTER — Ambulatory Visit: Payer: BC Managed Care – PPO | Admitting: Family Medicine

## 2020-12-10 ENCOUNTER — Other Ambulatory Visit: Payer: Self-pay | Admitting: Physician Assistant

## 2020-12-12 ENCOUNTER — Telehealth: Payer: Self-pay | Admitting: Physician Assistant

## 2020-12-12 NOTE — Telephone Encounter (Signed)
[employer] forms received. Sent to [employer]. 

## 2020-12-13 ENCOUNTER — Other Ambulatory Visit: Payer: Self-pay

## 2020-12-15 ENCOUNTER — Other Ambulatory Visit (HOSPITAL_COMMUNITY)
Admission: RE | Admit: 2020-12-15 | Discharge: 2020-12-15 | Disposition: A | Discharge status: Home / Self Care | Payer: BC Managed Care – PPO | Source: Ambulatory Visit | Attending: Orthopedic Surgery | Admitting: Orthopedic Surgery

## 2020-12-15 DIAGNOSIS — Z01812 Encounter for preprocedural laboratory examination: Secondary | ICD-10-CM | POA: Diagnosis not present

## 2020-12-15 DIAGNOSIS — Z20822 Contact with and (suspected) exposure to covid-19: Secondary | ICD-10-CM | POA: Insufficient documentation

## 2020-12-15 LAB — SARS CORONAVIRUS 2 (TAT 6-24 HRS): SARS Coronavirus 2: NEGATIVE

## 2020-12-17 ENCOUNTER — Telehealth: Payer: Self-pay | Admitting: Orthopedic Surgery

## 2020-12-17 NOTE — Telephone Encounter (Signed)
[employer] forms received. Sent to [employer]. 

## 2020-12-18 ENCOUNTER — Other Ambulatory Visit: Payer: Self-pay

## 2020-12-18 ENCOUNTER — Encounter (HOSPITAL_COMMUNITY): Payer: Self-pay | Admitting: Orthopedic Surgery

## 2020-12-18 MED ORDER — DEXTROSE 5 % IV SOLN
3.0000 g | INTRAVENOUS | Status: DC
  Filled 2020-12-18: qty 3000

## 2020-12-18 NOTE — Progress Notes (Addendum)
Ann Walter denies chest pain or shortness of breath.  Ann Walter Patient tested negative for Covid_2/19/22_ and has been in quarantine since that time. Ann Walter his pre- diabetic she takes Metformin, I instructed patient to not take it in am.  Patient does not check CBG.

## 2020-12-18 NOTE — Anesthesia Preprocedure Evaluation (Addendum)
Anesthesia Evaluation  Patient identified by MRN, date of birth, ID band Patient awake    Reviewed: Allergy & Precautions, NPO status , Patient's Chart, lab work & pertinent test results  Airway Mallampati: II  TM Distance: >3 FB Neck ROM: Full    Dental  (+) Dental Advisory Given   Pulmonary sleep apnea ,    breath sounds clear to auscultation       Cardiovascular hypertension, Pt. on medications  Rhythm:Regular Rate:Normal     Neuro/Psych negative neurological ROS     GI/Hepatic negative GI ROS, Neg liver ROS,   Endo/Other  Morbid obesity  Renal/GU negative Renal ROS     Musculoskeletal   Abdominal   Peds  Hematology negative hematology ROS (+)   Anesthesia Other Findings   Reproductive/Obstetrics                            Anesthesia Physical Anesthesia Plan  ASA: III  Anesthesia Plan: General   Post-op Pain Management:    Induction: Intravenous  PONV Risk Score and Plan: 3 and Dexamethasone, Ondansetron, Midazolam and Treatment may vary due to age or medical condition  Airway Management Planned: Oral ETT  Additional Equipment:   Intra-op Plan:   Post-operative Plan: Extubation in OR  Informed Consent: I have reviewed the patients History and Physical, chart, labs and discussed the procedure including the risks, benefits and alternatives for the proposed anesthesia with the patient or authorized representative who has indicated his/her understanding and acceptance.     Dental advisory given  Plan Discussed with: CRNA  Anesthesia Plan Comments:        Anesthesia Quick Evaluation

## 2020-12-19 ENCOUNTER — Ambulatory Visit (HOSPITAL_COMMUNITY): Payer: BC Managed Care – PPO | Admitting: Anesthesiology

## 2020-12-19 ENCOUNTER — Encounter (HOSPITAL_COMMUNITY): Payer: Self-pay | Admitting: Orthopedic Surgery

## 2020-12-19 ENCOUNTER — Other Ambulatory Visit: Payer: Self-pay

## 2020-12-19 ENCOUNTER — Encounter (HOSPITAL_COMMUNITY)
Admission: RE | Disposition: A | Discharge status: Home / Self Care | Payer: Self-pay | Source: Home / Self Care | Attending: Orthopedic Surgery

## 2020-12-19 ENCOUNTER — Ambulatory Visit (HOSPITAL_COMMUNITY)
Admission: RE | Admit: 2020-12-19 | Discharge: 2020-12-19 | Disposition: A | Discharge status: Home / Self Care | Payer: BC Managed Care – PPO | Attending: Orthopedic Surgery | Admitting: Orthopedic Surgery

## 2020-12-19 DIAGNOSIS — Z800 Family history of malignant neoplasm of digestive organs: Secondary | ICD-10-CM | POA: Diagnosis not present

## 2020-12-19 DIAGNOSIS — Z8249 Family history of ischemic heart disease and other diseases of the circulatory system: Secondary | ICD-10-CM | POA: Insufficient documentation

## 2020-12-19 DIAGNOSIS — M6751 Plica syndrome, right knee: Secondary | ICD-10-CM | POA: Diagnosis not present

## 2020-12-19 DIAGNOSIS — I10 Essential (primary) hypertension: Secondary | ICD-10-CM | POA: Diagnosis not present

## 2020-12-19 DIAGNOSIS — Y9289 Other specified places as the place of occurrence of the external cause: Secondary | ICD-10-CM | POA: Diagnosis not present

## 2020-12-19 DIAGNOSIS — Z7952 Long term (current) use of systemic steroids: Secondary | ICD-10-CM | POA: Insufficient documentation

## 2020-12-19 DIAGNOSIS — S83241A Other tear of medial meniscus, current injury, right knee, initial encounter: Secondary | ICD-10-CM | POA: Diagnosis not present

## 2020-12-19 DIAGNOSIS — Y9301 Activity, walking, marching and hiking: Secondary | ICD-10-CM | POA: Insufficient documentation

## 2020-12-19 DIAGNOSIS — Z79899 Other long term (current) drug therapy: Secondary | ICD-10-CM | POA: Diagnosis not present

## 2020-12-19 DIAGNOSIS — Z882 Allergy status to sulfonamides status: Secondary | ICD-10-CM | POA: Insufficient documentation

## 2020-12-19 DIAGNOSIS — Z803 Family history of malignant neoplasm of breast: Secondary | ICD-10-CM | POA: Insufficient documentation

## 2020-12-19 DIAGNOSIS — X509XXA Other and unspecified overexertion or strenuous movements or postures, initial encounter: Secondary | ICD-10-CM | POA: Insufficient documentation

## 2020-12-19 DIAGNOSIS — R7303 Prediabetes: Secondary | ICD-10-CM | POA: Diagnosis not present

## 2020-12-19 DIAGNOSIS — Z7984 Long term (current) use of oral hypoglycemic drugs: Secondary | ICD-10-CM | POA: Insufficient documentation

## 2020-12-19 DIAGNOSIS — Z87828 Personal history of other (healed) physical injury and trauma: Secondary | ICD-10-CM

## 2020-12-19 DIAGNOSIS — M659 Synovitis and tenosynovitis, unspecified: Secondary | ICD-10-CM | POA: Diagnosis not present

## 2020-12-19 DIAGNOSIS — Z841 Family history of disorders of kidney and ureter: Secondary | ICD-10-CM | POA: Insufficient documentation

## 2020-12-19 DIAGNOSIS — M23361 Other meniscus derangements, other lateral meniscus, right knee: Secondary | ICD-10-CM

## 2020-12-19 DIAGNOSIS — Z888 Allergy status to other drugs, medicaments and biological substances status: Secondary | ICD-10-CM | POA: Diagnosis not present

## 2020-12-19 DIAGNOSIS — Z833 Family history of diabetes mellitus: Secondary | ICD-10-CM | POA: Insufficient documentation

## 2020-12-19 DIAGNOSIS — M23331 Other meniscus derangements, other medial meniscus, right knee: Secondary | ICD-10-CM | POA: Diagnosis not present

## 2020-12-19 DIAGNOSIS — S83206A Unspecified tear of unspecified meniscus, current injury, right knee, initial encounter: Secondary | ICD-10-CM | POA: Diagnosis not present

## 2020-12-19 DIAGNOSIS — S83281A Other tear of lateral meniscus, current injury, right knee, initial encounter: Secondary | ICD-10-CM | POA: Insufficient documentation

## 2020-12-19 DIAGNOSIS — Z791 Long term (current) use of non-steroidal anti-inflammatories (NSAID): Secondary | ICD-10-CM | POA: Diagnosis not present

## 2020-12-19 DIAGNOSIS — G4730 Sleep apnea, unspecified: Secondary | ICD-10-CM | POA: Diagnosis not present

## 2020-12-19 HISTORY — DX: Tinnitus, bilateral: H93.13

## 2020-12-19 HISTORY — DX: Prediabetes: R73.03

## 2020-12-19 HISTORY — DX: Headache, unspecified: R51.9

## 2020-12-19 HISTORY — PX: KNEE ARTHROSCOPY: SHX127

## 2020-12-19 HISTORY — DX: Sleep apnea, unspecified: G47.30

## 2020-12-19 HISTORY — DX: Family history of other specified conditions: Z84.89

## 2020-12-19 LAB — BASIC METABOLIC PANEL
Anion gap: 10 (ref 5–15)
BUN: 15 mg/dL (ref 6–20)
CO2: 26 mmol/L (ref 22–32)
Calcium: 9.3 mg/dL (ref 8.9–10.3)
Chloride: 103 mmol/L (ref 98–111)
Creatinine, Ser: 0.87 mg/dL (ref 0.44–1.00)
GFR, Estimated: >60 mL/min (ref >60)
Glucose, Bld: 106 mg/dL — ABNORMAL HIGH (ref 70–99)
Potassium: 4.7 mmol/L (ref 3.5–5.1)
Sodium: 139 mmol/L (ref 135–145)

## 2020-12-19 LAB — GLUCOSE, CAPILLARY: Glucose-Capillary: 120 mg/dL — ABNORMAL HIGH (ref 70–99)

## 2020-12-19 SURGERY — ARTHROSCOPY, KNEE
Anesthesia: General | Site: Knee | Laterality: Right

## 2020-12-19 MED ORDER — PROPOFOL 10 MG/ML IV BOLUS
INTRAVENOUS | Status: DC | PRN
  Administered 2020-12-19: 30 mg via INTRAVENOUS
  Administered 2020-12-19: 300 mg via INTRAVENOUS

## 2020-12-19 MED ORDER — FENTANYL CITRATE (PF) 100 MCG/2ML IJ SOLN
25.0000 ug | INTRAMUSCULAR | Status: DC | PRN
  Administered 2020-12-19: 50 ug via INTRAVENOUS

## 2020-12-19 MED ORDER — MIDAZOLAM HCL 2 MG/2ML IJ SOLN
INTRAMUSCULAR | Status: DC | PRN
  Administered 2020-12-19: 2 mg via INTRAVENOUS

## 2020-12-19 MED ORDER — DEXTROSE 5 % IV SOLN
INTRAVENOUS | Status: DC | PRN
  Administered 2020-12-19: 3 g via INTRAVENOUS

## 2020-12-19 MED ORDER — ONDANSETRON HCL 4 MG/2ML IJ SOLN
INTRAMUSCULAR | Status: DC | PRN
  Administered 2020-12-19: 4 mg via INTRAVENOUS

## 2020-12-19 MED ORDER — OXYCODONE HCL 5 MG PO TABS
ORAL_TABLET | ORAL | Status: AC
  Administered 2020-12-19: 5 mg via ORAL
  Filled 2020-12-19: qty 1

## 2020-12-19 MED ORDER — 0.9 % SODIUM CHLORIDE (POUR BTL) OPTIME
TOPICAL | Status: DC | PRN
  Administered 2020-12-19: 1000 mL

## 2020-12-19 MED ORDER — ORAL CARE MOUTH RINSE: 15.0000 mL | Freq: Once | OROMUCOSAL | Status: AC

## 2020-12-19 MED ORDER — DEXAMETHASONE SODIUM PHOSPHATE 10 MG/ML IJ SOLN
INTRAMUSCULAR | Status: DC | PRN
  Administered 2020-12-19: 10 mg via INTRAVENOUS

## 2020-12-19 MED ORDER — ACETAMINOPHEN 500 MG PO TABS
1000.0000 mg | ORAL_TABLET | Freq: Once | ORAL | Status: AC
  Administered 2020-12-19: 1000 mg via ORAL
  Filled 2020-12-19: qty 2

## 2020-12-19 MED ORDER — OXYCODONE HCL 5 MG PO TABS: 5.0000 mg | ORAL_TABLET | ORAL | Status: DC | PRN

## 2020-12-19 MED ORDER — AMISULPRIDE (ANTIEMETIC) 5 MG/2ML IV SOLN: 10.0000 mg | Freq: Once | INTRAVENOUS | Status: DC | PRN

## 2020-12-19 MED ORDER — FENTANYL CITRATE (PF) 250 MCG/5ML IJ SOLN
INTRAMUSCULAR | Status: AC
  Filled 2020-12-19: qty 5

## 2020-12-19 MED ORDER — OXYCODONE-ACETAMINOPHEN 5-325 MG PO TABS: 1.0000 | ORAL_TABLET | ORAL | 0 refills | Status: DC | PRN

## 2020-12-19 MED ORDER — LACTATED RINGERS IV SOLN: INTRAVENOUS | Status: DC

## 2020-12-19 MED ORDER — CHLORHEXIDINE GLUCONATE 0.12 % MT SOLN
OROMUCOSAL | Status: AC
  Administered 2020-12-19: 15 mL via OROMUCOSAL
  Filled 2020-12-19: qty 15

## 2020-12-19 MED ORDER — PROPOFOL 10 MG/ML IV BOLUS
INTRAVENOUS | Status: AC
  Filled 2020-12-19: qty 40

## 2020-12-19 MED ORDER — LIDOCAINE 2% (20 MG/ML) 5 ML SYRINGE
INTRAMUSCULAR | Status: DC | PRN
  Administered 2020-12-19: 60 mg via INTRAVENOUS

## 2020-12-19 MED ORDER — FENTANYL CITRATE (PF) 100 MCG/2ML IJ SOLN
INTRAMUSCULAR | Status: AC
  Administered 2020-12-19: 50 ug via INTRAVENOUS
  Filled 2020-12-19: qty 2

## 2020-12-19 MED ORDER — MIDAZOLAM HCL 2 MG/2ML IJ SOLN
INTRAMUSCULAR | Status: AC
  Filled 2020-12-19: qty 2

## 2020-12-19 MED ORDER — FENTANYL CITRATE (PF) 250 MCG/5ML IJ SOLN
INTRAMUSCULAR | Status: DC | PRN
  Administered 2020-12-19 (×4): 50 ug via INTRAVENOUS

## 2020-12-19 MED ORDER — SODIUM CHLORIDE 0.9 % IR SOLN
Status: DC | PRN
  Administered 2020-12-19 (×2): 3000 mL

## 2020-12-19 MED ORDER — CHLORHEXIDINE GLUCONATE 0.12 % MT SOLN: 15.0000 mL | Freq: Once | OROMUCOSAL | Status: AC

## 2020-12-19 SURGICAL SUPPLY — 29 items
BLADE EXCALIBUR 4.0X13 (MISCELLANEOUS) IMPLANT
BNDG COHESIVE 6X5 TAN STRL LF (GAUZE/BANDAGES/DRESSINGS) ×2 IMPLANT
BNDG GAUZE ELAST 4 BULKY (GAUZE/BANDAGES/DRESSINGS) ×2 IMPLANT
COVER SURGICAL LIGHT HANDLE (MISCELLANEOUS) ×4 IMPLANT
CUFF TOURN SGL QUICK 34 (TOURNIQUET CUFF)
CUFF TOURN SGL QUICK 42 (TOURNIQUET CUFF) IMPLANT
CUFF TRNQT CYL 34X4.125X (TOURNIQUET CUFF) IMPLANT
DRAPE ARTHROSCOPY W/POUCH 114 (DRAPES) ×2 IMPLANT
DRAPE U-SHAPE 47X51 STRL (DRAPES) ×2 IMPLANT
DRSG EMULSION OIL 3X3 NADH (GAUZE/BANDAGES/DRESSINGS) ×2 IMPLANT
DRSG PAD ABDOMINAL 8X10 ST (GAUZE/BANDAGES/DRESSINGS) ×2 IMPLANT
DURAPREP 26ML APPLICATOR (WOUND CARE) ×2 IMPLANT
GAUZE SPONGE 4X4 12PLY STRL (GAUZE/BANDAGES/DRESSINGS) ×2 IMPLANT
GLOVE BIOGEL PI IND STRL 9 (GLOVE) ×1 IMPLANT
GLOVE BIOGEL PI INDICATOR 9 (GLOVE) ×1
GLOVE SURG ORTHO 9.0 STRL STRW (GLOVE) ×2 IMPLANT
GOWN STRL REUS W/ TWL XL LVL3 (GOWN DISPOSABLE) ×3 IMPLANT
GOWN STRL REUS W/TWL XL LVL3 (GOWN DISPOSABLE) ×6
KIT BASIN OR (CUSTOM PROCEDURE TRAY) ×2 IMPLANT
KIT TURNOVER KIT B (KITS) ×2 IMPLANT
MANIFOLD NEPTUNE II (INSTRUMENTS) ×2 IMPLANT
NEEDLE 18GX1X1/2 (RX/OR ONLY) (NEEDLE) ×2 IMPLANT
PACK ARTHROSCOPY DSU (CUSTOM PROCEDURE TRAY) ×2 IMPLANT
PAD ARMBOARD 7.5X6 YLW CONV (MISCELLANEOUS) ×4 IMPLANT
PORT APPOLLO RF 90DEGREE MULTI (SURGICAL WAND) IMPLANT
SUT ETHILON 4 0 PS 2 18 (SUTURE) ×2 IMPLANT
TOWEL GREEN STERILE FF (TOWEL DISPOSABLE) ×4 IMPLANT
TUBE CONNECTING 12X1/4 (SUCTIONS) ×2 IMPLANT
TUBING ARTHROSCOPY IRRIG 16FT (MISCELLANEOUS) ×2 IMPLANT

## 2020-12-19 NOTE — Progress Notes (Signed)
Orthopedic Tech Progress Note Patient Details:  KATIEANN HUNGATE Jan 21, 1966 [id number] PACU RN called requesting a PAIR OF CRUTCHES for patient Ortho Devices Type of Ortho Device: Crutches Ortho Device/Splint Interventions: Application,Adjustment   Post Interventions Patient Tolerated: Well,Other (comment) Instructions Provided: Care of device   Donald Pore 12/19/2020, 9:22 AM

## 2020-12-19 NOTE — Transfer of Care (Signed)
Immediate Anesthesia Transfer of Care Note  Patient: Ann Walter  Procedure(s) Performed: RIGHT KNEE ARTHROSCOPY WITH DEBRIDEMENT (Right Knee)  Patient Location: PACU  Anesthesia Type:General  Level of Consciousness: drowsy and patient cooperative  Airway & Oxygen Therapy: Patient Spontanous Breathing  Post-op Assessment: Report given to RN, Post -op Vital signs reviewed and stable and Patient moving all extremities X 4  Post vital signs: Reviewed and stable  Last Vitals:  Vitals Value Taken Time  BP 184/95 12/19/20 0826  Temp    Pulse 78 12/19/20 0828  Resp 28 12/19/20 0828  SpO2 100 % 12/19/20 0828  Vitals shown include unvalidated device data.  Last Pain:  Vitals:   12/19/20 0623  TempSrc: Oral  PainSc:       Patients Stated Pain Goal: 4 (12/19/20 0615)  Complications: No complications documented.

## 2020-12-19 NOTE — H&P (Signed)
Ann Walter is an 55 y.o. female.   Chief Complaint: Right knee pain HPI: Patient is a pleasant 55 year old woman who is 2 weeks status post twisting her knee while walking in a field.  She had immediate pain mostly over the medial side of her knee.  She was able to ambulate after this.  Since then she has had pain which she focuses on the medial knee joint.  Does go down to the proximal tibia just a little bit.  She denies any instability but states is more painful symptoms.  Denies any locking or catching  Past Medical History:  Diagnosis Date  . Allergy   . Benign hematuria 12/2009   WORKUP WITH DR. NESI WAS NEGATIVE  . Depression   . Family history of adverse reaction to anesthesia    My dad -  slow awaking  . Headache    Mirgraine  - years ago  . Heart murmur   . HTN (hypertension)   . Obesity   . Pre-diabetes   . 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 Sleep apnea   . UTI (urinary tract infection), uncomplicated     Past Surgical History:  Procedure Laterality Date  . CHOLECYSTECTOMY  10/2010  . CYSTECTOMY     x 2 from chest    Family History  Problem Relation Age of Onset  . Diabetes Mother   . Heart disease Mother        HAD TRIPLE BYPASS  . Hypertension Mother   . Kidney disease Father   . Breast cancer Sister   . Breast cancer Maternal Grandmother   . Cancer Maternal Grandmother        LYMPH NODE CANCER  . Cancer Maternal Aunt        COLORECTAL   . COPD Brother   . Kidney failure Brother   . Colon cancer Neg Hx    Social History:  reports that she has never smoked. She has never used smokeless tobacco. She reports previous alcohol use. She reports that she does not use drugs.  Allergies:  Allergies  Allergen Reactions  . Ann Walter [Bupropion] Swelling    Throat swelling   . Ann Walter Other (See Comments)    Unknown per pt, it's been a long time    Medications Prior to Admission  Medication Sig Dispense Refill  . ALPRAZolam (XANAX) 0.5 MG  tablet Take 1 tablet by mouth three times daily as needed (Patient taking differently: Take 0.5 mg by mouth 3 (three) times daily as needed for anxiety.) 90 tablet 3  . cholecalciferol (VITAMIN D3) 25 MCG (1000 UNIT) tablet Take 1,000 Units by mouth daily.    Marland Kitchen desvenlafaxine (PRISTIQ) 50 MG 24 hr tablet Take 1 tablet by mouth once daily (Patient taking differently: Take 50 mg by mouth daily.) 90 tablet 0  . famotidine (PEPCID) 20 MG tablet Take 20 mg by mouth daily as needed for heartburn or indigestion.    . meloxicam (MOBIC) 7.5 MG tablet Take 1 tablet (7.5 mg total) by mouth daily. 30 tablet 1  . metFORMIN (GLUCOPHAGE) 500 MG tablet Take 1 tablet by mouth once daily (Patient taking differently: Take 500 mg by mouth daily with breakfast.) 90 tablet 0  . Multiple Vitamins-Minerals (MULTIVITAMIN WITH MINERALS) tablet Take 1 tablet by mouth daily.    Marland Kitchen telmisartan (MICARDIS) 80 MG tablet Take 1 tablet (80 mg total) by mouth daily. 90 tablet 1  . zinc gluconate 50 MG tablet Take 50 mg by mouth  daily.    . ondansetron (ZOFRAN) 8 MG tablet Take 1 tablet (8 mg total) by mouth every 8 (eight) hours as needed for nausea or vomiting. (Patient not taking: Reported on 12/11/2020) 15 tablet 0  . predniSONE (DELTASONE) 20 MG tablet Take 40 mg daily for 4 days, then 20 mg daily for 4 days (Patient not taking: Reported on 12/11/2020) 12 tablet 0    Results for orders placed or performed during the hospital encounter of 12/19/20 (from the past 48 hour(s))  Glucose, capillary     Status: Abnormal   Collection Time: 12/19/20  6:10 AM  Result Value Ref Range   Glucose-Capillary 120 (H) 70 - 99 mg/dL    Comment: Glucose reference range applies only to samples taken after fasting for at least 8 hours.   No results found.  Review of Systems  All other systems reviewed and are negative.   Blood pressure (!) 147/77, pulse 74, temperature 97.8 F (36.6 C), temperature source Oral, resp. rate 17, height 5' 3.5"  (1.613 m), weight 117.9 kg, SpO2 100 %. Physical Exam   Patient is alert, oriented, no adenopathy, well-dressed, normal affect, normal respiratory effort. Right knee no effusion.  Small amount of soft tissue swelling.  She is acutely tender over the anterior medial joint line.  She does able to extend her knee and resist.  Flexion causes her more pain.  Difficult to assess stability secondary to her guarding and circumference of her leg.Heart RRR Lungs clear  Assessment/Plan Visit Diagnoses:  1. Right knee pain, unspecified chronicity     Plan: Given the immense amount of pain she is having I am concerned for ligamentous or meniscal injury.    I have instructed her to try to get a knee support from a sporting goods cooler or from a local medical supply store.  I think she would do better on crutches than a cane.  We will also call her in a prescription for Mobic.  She understands not to take other anti-inflammatories with this MRI Demonstrates medial meniscus tear. Will go forward with Knee arthroscopy.    [place] Persons, PA 12/19/2020, 6:40 AM

## 2020-12-19 NOTE — Anesthesia Procedure Notes (Signed)
Procedure Name: LMA Insertion Date/Time: 12/19/2020 7:35 AM Performed by: Alease Medina, CRNA Pre-anesthesia Checklist: Patient identified, Emergency Drugs available, Suction available and Patient being monitored Patient Re-evaluated:Patient Re-evaluated prior to induction Oxygen Delivery Method: Circle system utilized Preoxygenation: Pre-oxygenation with 100% oxygen Induction Type: IV induction Ventilation: Mask ventilation without difficulty LMA: LMA inserted LMA Size: 4.0 Number of attempts: 1 Placement Confirmation: positive ETCO2,  breath sounds checked- equal and bilateral and CO2 detector Tube secured with: Tape Dental Injury: Teeth and Oropharynx as per pre-operative assessment

## 2020-12-19 NOTE — Op Note (Signed)
12/19/2020  8:17 AM  PATIENT:  Ann Walter    PRE-OPERATIVE DIAGNOSIS:  Meniscal Tears Right Knee  POST-OPERATIVE DIAGNOSIS:  Same  PROCEDURE:  RIGHT KNEE ARTHROSCOPY WITH DEBRIDEMENT  SURGEON:  Nadara Mustard, MD  PHYSICIAN ASSISTANT:None ANESTHESIA:   General  PREOPERATIVE INDICATIONS:  [name] is a  55 y.o. female with a diagnosis of Meniscal Tears Right Knee who failed conservative measures and elected for surgical management.    The risks benefits and alternatives were discussed with the patient preoperatively including but not limited to the risks of infection, bleeding, nerve injury, cardiopulmonary complications, the need for revision surgery, among others, and the patient was willing to proceed.  OPERATIVE IMPLANTS: None  @ENCIMAGES @  OPERATIVE FINDINGS: Patient had a posterior horn of the medial meniscal tear as well as a degenerative tear of the lateral meniscus with extensive synovitis and a plica.  There is an osteochondral defect of the medial femoral condyle and lateral femoral condyle articular cartilage of the tibia was intact the ACL was intact.  OPERATIVE PROCEDURE: Patient was brought the operating room and underwent a general anesthetic.  After adequate levels anesthesia were obtained patient's right lower extremity was prepped using DuraPrep draped into a sterile field a timeout was called.  The scope was inserted through the anterior lateral portal and an anterior medial working portal was established.  Visualization showed extensive synovitis this was resected with the shaver and the electrical wand.  With valgus stress patient had a tear of the posterior horn of the medial meniscus this was debrided with the shaver and the electrical wand.  There was a osteochondral defect of the medial femoral condyle approximately 10 mm in diameter and this was debrided back to bleeding viable subchondral bone.  Examination notch showed an intact ACL examination lateral  joint line figure-of-four position showed a degenerative tear of the lateral meniscus.  The lateral meniscus was debrided with the shaver and the electrical wand.  With the knee extended patient had a large plica this was resected there was also further synovitis in the medial and lateral compartments this was resected as well with the shaver and the electrical wand.  The patellofemoral joint showed good articular cartilage and was also a cartilage defect on the lateral femoral condyle which was also debrided.  A survey of all compartments was again performed with no loose bodies the instruments were removed the portals were closed using 2-0 nylon sterile dressing was applied patient was extubated taken the PACU in stable condition   DISCHARGE PLANNING:  Antibiotic duration: Preoperative antibiotics  Weightbearing: Weightbearing as tolerated  Pain medication: Prescription for Percocet  Dressing care/ Wound VAC: Remove dressing in 2 days  Ambulatory devices: Crutches  Discharge to: Home.  Follow-up: In the office 1 week post operative.

## 2020-12-19 NOTE — Interval H&P Note (Signed)
History and Physical Interval Note:  12/19/2020 6:53 AM  Ann Walter  has presented today for surgery, with the diagnosis of Meniscal Tears Right Knee.  The various methods of treatment have been discussed with the patient and family. After consideration of risks, benefits and other options for treatment, the patient has consented to  Procedure(s): RIGHT KNEE ARTHROSCOPY WITH DEBRIDEMENT (Right) as a surgical intervention.  The patient's history has been reviewed, patient examined, no change in status, stable for surgery.  I have reviewed the patient's chart and labs.  Questions were answered to the patient's satisfaction.     Nadara Mustard

## 2020-12-20 ENCOUNTER — Encounter (HOSPITAL_COMMUNITY): Payer: Self-pay | Admitting: Orthopedic Surgery

## 2020-12-21 ENCOUNTER — Telehealth: Payer: Self-pay | Admitting: Physician Assistant

## 2020-12-21 NOTE — Anesthesia Postprocedure Evaluation (Signed)
Anesthesia Post Note  Patient: Ann Walter  Procedure(s) Performed: RIGHT KNEE ARTHROSCOPY WITH DEBRIDEMENT (Right Knee)     Patient location during evaluation: PACU Anesthesia Type: General Level of consciousness: awake and alert Pain management: pain level controlled Vital Signs Assessment: post-procedure vital signs reviewed and stable Respiratory status: spontaneous breathing, nonlabored ventilation, respiratory function stable and patient connected to nasal cannula oxygen Cardiovascular status: blood pressure returned to baseline and stable Postop Assessment: no apparent nausea or vomiting Anesthetic complications: no   No complications documented.  Last Vitals:  Vitals:   12/19/20 0905 12/19/20 0910  BP: (!) 163/91 (!) 163/91  Pulse: 65 64  Resp: 16 13  Temp: (!) 36.1 C   SpO2: 97% 97%    Last Pain:  Vitals:   12/19/20 0855  TempSrc:   PainSc: 5                  Kennieth Rad

## 2020-12-21 NOTE — Telephone Encounter (Signed)
Received $25.00 cash and medical records release form from patient /   Forwarding to [employer] today 

## 2021-01-02 ENCOUNTER — Encounter: Payer: Self-pay | Admitting: Physician Assistant

## 2021-01-02 ENCOUNTER — Ambulatory Visit (INDEPENDENT_AMBULATORY_CARE_PROVIDER_SITE_OTHER): Payer: BC Managed Care – PPO | Admitting: Physician Assistant

## 2021-01-02 DIAGNOSIS — M23322 Other meniscus derangements, posterior horn of medial meniscus, left knee: Secondary | ICD-10-CM

## 2021-01-02 NOTE — Progress Notes (Signed)
Office Visit Note   Patient: Ann Walter           Date of Birth: Apr 12, 1966           MRN: [id number] Visit Date: 01/02/2021              Requested by: Pearline Cables, MD [address] [place],  [place] [address] PCP: Pearline Cables, MD  Chief Complaint  Patient presents with  . Right Knee - Routine Post Op    12/19/20 right knee scope and debridement       HPI: Patient presents today she is 2 weeks status post right knee arthroscopy and debridement.  Overall she is doing well.  Assessment & Plan: Visit Diagnoses: No diagnosis found.  Plan: Sutures were harvested today without difficulty.  We discussed staying out of work for another week and a half or so.  I have given her information on close chain exercises for knee strengthening.  Continue to ice as needed.  Follow-up in 3 weeks.  Follow-Up Instructions: No follow-ups on file.   Ortho Exam  Patient is alert, oriented, no adenopathy, well-dressed, normal affect, normal respiratory effort. Right knee well-healed surgical portals.  No effusion no cellulitis no swelling she still has some tenderness over the portals we discussed desensitization and massage.  No signs of infection compartments are soft and nontender negative Denna Haggard' sign  Imaging: No results found. No images are attached to the encounter.  Labs: Lab Results  Component Value Date   HGBA1C 6.3 12/03/2020   HGBA1C 6.2 (H) 08/09/2020   HGBA1C 6.2 09/01/2019   REPTSTATUS 03/27/2018 FINAL 03/25/2018   CULT (A) 03/25/2018    <10,000 COLONIES/mL INSIGNIFICANT GROWTH Performed at [facility], [address], [place], [place] [address]      Lab Results  Component Value Date   ALBUMIN 4.2 09/01/2019   ALBUMIN 3.8 03/25/2018   ALBUMIN 4.0 02/22/2018    No results found for: MG No results found for: VD25OH  No results found for: PREALBUMIN CBC EXTENDED Latest Ref Rng & Units 08/09/2020 09/01/2019 03/25/2018  WBC 3.8 -  10.8 Thousand/uL 7.1 6.4 6.4  RBC 3.80 - 5.10 Million/uL 4.32 4.30 4.37  HGB 11.7 - 15.5 g/dL 79.3 90.3 00.9  HCT 23.3 - 45.0 % 38.8 38.7 38.5  PLT 140 - 400 Thousand/uL 270 236.0 249  NEUTROABS 1.7 - 7.7 K/uL - - -  LYMPHSABS 0.7 - 4.0 K/uL - - -     There is no height or weight on file to calculate BMI.  Orders:  No orders of the defined types were placed in this encounter.  No orders of the defined types were placed in this encounter.    Procedures: No procedures performed  Clinical Data: No additional findings.  ROS:  All other systems negative, except as noted in the HPI. Review of Systems  Objective: Vital Signs: LMP  (LMP Unknown) Comment: pt states she is not have periods  Specialty Comments:  No specialty comments available.  PMFS History: Patient Active Problem List   Diagnosis Date Noted  . History of meniscal tear   . Left shoulder pain 12/22/2017  . Upper airway cough syndrome 09/14/2017  . Pre-diabetes 11/22/2015  . Morbid obesity due to excess calories (HCC) 11/21/2015  . Ovarian cyst 02/28/2015  . Chest pain, atypical 03/08/2014  . Heart murmur 03/08/2014  . Heart palpitations 03/08/2014  . GAD (generalized anxiety disorder) 02/13/2014  . Depression   .  Essential hypertension    Past Medical History:  Diagnosis Date  . Allergy   . Benign hematuria 12/2009   WORKUP WITH DR. NESI WAS NEGATIVE  . Depression   . Family history of adverse reaction to anesthesia    My dad -  slow awaking  . Headache    Mirgraine  - years ago  . Heart murmur   . HTN (hypertension)   . Obesity   . Pre-diabetes   . 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 Sleep apnea   . UTI (urinary tract infection), uncomplicated     Family History  Problem Relation Age of Onset  . Diabetes Mother   . Heart disease Mother        HAD TRIPLE BYPASS  . Hypertension Mother   . Kidney disease Father   . Breast cancer Sister   . Breast cancer Maternal Grandmother   . Cancer  Maternal Grandmother        LYMPH NODE CANCER  . Cancer Maternal Aunt        COLORECTAL   . COPD Brother   . Kidney failure Brother   . Colon cancer Neg Hx     Past Surgical History:  Procedure Laterality Date  . CHOLECYSTECTOMY  10/2010  . CYSTECTOMY     x 2 from chest  . KNEE ARTHROSCOPY Right 12/19/2020   Procedure: RIGHT KNEE ARTHROSCOPY WITH DEBRIDEMENT;  Surgeon: Nadara Mustard, MD;  Location: [facility];  Service: Orthopedics;  Laterality: Right;   Social History   Occupational History  . 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 Occupation: HAZARD/FLOOD    Employer: [employer]  Tobacco Use  . Smoking status: Never Smoker  . Smokeless tobacco: Never Used  Vaping Use  . Vaping Use: Never used  Substance and Sexual Activity  . Alcohol use: Not Currently    Alcohol/week: 0.0 standard drinks    Comment: occasionally  . Drug use: No  . Sexual activity: Yes    Partners: Male    Birth control/protection: Pill    Comment: INTERCOURSE AGE 78, SEXUAL PARTNERS MORE THAN 5

## 2021-01-04 ENCOUNTER — Encounter: Payer: Self-pay | Admitting: Orthopedic Surgery

## 2021-01-04 ENCOUNTER — Other Ambulatory Visit: Payer: Self-pay | Admitting: Family Medicine

## 2021-01-04 DIAGNOSIS — F411 Generalized anxiety disorder: Secondary | ICD-10-CM

## 2021-01-07 ENCOUNTER — Other Ambulatory Visit: Payer: Self-pay | Admitting: Physician Assistant

## 2021-01-07 MED ORDER — OXYCODONE-ACETAMINOPHEN 5-325 MG PO TABS: 1.0000 | ORAL_TABLET | ORAL | 0 refills | Status: DC | PRN

## 2021-01-07 NOTE — Telephone Encounter (Signed)
Requesting: alprazolam Contract: none UDS: none Last Visit: 08/09/20 Next Visit: none Last Refill: 09/11/20  Please Advise

## 2021-01-19 ENCOUNTER — Encounter: Payer: Self-pay | Admitting: Orthopedic Surgery

## 2021-01-21 ENCOUNTER — Encounter: Payer: Self-pay | Admitting: Orthopedic Surgery

## 2021-01-21 ENCOUNTER — Ambulatory Visit (INDEPENDENT_AMBULATORY_CARE_PROVIDER_SITE_OTHER): Payer: BC Managed Care – PPO | Admitting: Physician Assistant

## 2021-01-21 DIAGNOSIS — M79661 Pain in right lower leg: Secondary | ICD-10-CM

## 2021-01-21 NOTE — Progress Notes (Signed)
Office Visit Note   Patient: Ann Walter           Date of Birth: 1966-01-05           MRN: [id number] Visit Date: 01/21/2021              Requested by: Pearline Cables, MD [address] [place],  [place] [address] PCP: Pearline Cables, MD  Chief Complaint  Patient presents with  . Right Knee - Routine Post Op    2/213/22 right knee scope and debridement       HPI: Patient presents today she is 5 weeks status post right knee arthroscopy and debridement.  Over the course the last week she has developed some calf swelling and pain.  This is now been going on for about a week.  She denies any warmth.  She says her knee knee feels just fine.  She sometimes feels like the back of her calf feels "as hard as a rock "  Assessment & Plan: Visit Diagnoses:  1. Pain in right lower leg     Plan: Concerns for DVT.  She will begin an aspirin tonight.  She will get an ultrasound tomorrow and understands if she had any other of her symptoms or shortness of breath she should be taken to the nearest emergency room.  We will follow-up with her tomorrow  Follow-Up Instructions: No follow-ups on file.   Ortho Exam  Patient is alert, oriented, no adenopathy, well-dressed, normal affect, normal respiratory effort. Right lower extremity significant soft tissue swelling but no cellulitis compartments are compressible.  She has only very mild tenderness with Homans testing.  She does have an area in the posterior calf which is acutely tender to palpation.  No signs of cellulitis or infection.  Imaging: No results found. No images are attached to the encounter.  Labs: Lab Results  Component Value Date   HGBA1C 6.3 12/03/2020   HGBA1C 6.2 (H) 08/09/2020   HGBA1C 6.2 09/01/2019   REPTSTATUS 03/27/2018 FINAL 03/25/2018   CULT (A) 03/25/2018    <10,000 COLONIES/mL INSIGNIFICANT GROWTH Performed at [facility], [address] [place], [place] [address]      Lab  Results  Component Value Date   ALBUMIN 4.2 09/01/2019   ALBUMIN 3.8 03/25/2018   ALBUMIN 4.0 02/22/2018    No results found for: MG No results found for: VD25OH  No results found for: PREALBUMIN CBC EXTENDED Latest Ref Rng & Units 08/09/2020 09/01/2019 03/25/2018  WBC 3.8 - 10.8 Thousand/uL 7.1 6.4 6.4  RBC 3.80 - 5.10 Million/uL 4.32 4.30 4.37  HGB 11.7 - 15.5 g/dL 44.0 34.7 42.5  HCT 95.6 - 45.0 % 38.8 38.7 38.5  PLT 140 - 400 Thousand/uL 270 236.0 249  NEUTROABS 1.7 - 7.7 K/uL - - -  LYMPHSABS 0.7 - 4.0 K/uL - - -     There is no height or weight on file to calculate BMI.  Orders:  Orders Placed This Encounter  Procedures  . VAS [place] LOWER EXTREMITY VENOUS (DVT)   No orders of the defined types were placed in this encounter.    Procedures: No procedures performed  Clinical Data: No additional findings.  ROS:  All other systems negative, except as noted in the HPI. Review of Systems  Objective: Vital Signs: LMP  (LMP Unknown) Comment: pt states she is not have periods  Specialty Comments:  No specialty comments available.  PMFS History: Patient Active  Problem List   Diagnosis Date Noted  . History of meniscal tear   . Left shoulder pain 12/22/2017  . Upper airway cough syndrome 09/14/2017  . Pre-diabetes 11/22/2015  . Morbid obesity due to excess calories (HCC) 11/21/2015  . Ovarian cyst 02/28/2015  . Chest pain, atypical 03/08/2014  . Heart murmur 03/08/2014  . Heart palpitations 03/08/2014  . GAD (generalized anxiety disorder) 02/13/2014  . Depression   . Essential hypertension    Past Medical History:  Diagnosis Date  . Allergy   . Benign hematuria 12/2009   WORKUP WITH DR. NESI WAS NEGATIVE  . Depression   . Family history of adverse reaction to anesthesia    My dad -  slow awaking  . Headache    Mirgraine  - years ago  . Heart murmur   . HTN (hypertension)   . Obesity   . Pre-diabetes   . 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 Sleep apnea    . UTI (urinary tract infection), uncomplicated     Family History  Problem Relation Age of Onset  . Diabetes Mother   . Heart disease Mother        HAD TRIPLE BYPASS  . Hypertension Mother   . Kidney disease Father   . Breast cancer Sister   . Breast cancer Maternal Grandmother   . Cancer Maternal Grandmother        LYMPH NODE CANCER  . Cancer Maternal Aunt        COLORECTAL   . COPD Brother   . Kidney failure Brother   . Colon cancer Neg Hx     Past Surgical History:  Procedure Laterality Date  . CHOLECYSTECTOMY  10/2010  . CYSTECTOMY     x 2 from chest  . KNEE ARTHROSCOPY Right 12/19/2020   Procedure: RIGHT KNEE ARTHROSCOPY WITH DEBRIDEMENT;  Surgeon: Nadara Mustard, MD;  Location: [facility] OR;  Service: Orthopedics;  Laterality: Right;   Social History   Occupational History  . 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 Occupation: HAZARD/FLOOD    Employer: [employer]  Tobacco Use  . Smoking status: Never Smoker  . Smokeless tobacco: Never Used  Vaping Use  . Vaping Use: Never used  Substance and Sexual Activity  . Alcohol use: Not Currently    Alcohol/week: 0.0 standard drinks    Comment: occasionally  . Drug use: No  . Sexual activity: Yes    Partners: Male    Birth control/protection: Pill    Comment: INTERCOURSE AGE 66, SEXUAL PARTNERS MORE THAN 5

## 2021-01-22 ENCOUNTER — Other Ambulatory Visit: Payer: Self-pay

## 2021-01-22 ENCOUNTER — Encounter: Payer: Self-pay | Admitting: Orthopedic Surgery

## 2021-01-22 ENCOUNTER — Ambulatory Visit (HOSPITAL_COMMUNITY)
Admission: RE | Admit: 2021-01-22 | Discharge: 2021-01-22 | Disposition: A | Discharge status: Home / Self Care | Payer: BC Managed Care – PPO | Source: Ambulatory Visit | Attending: Orthopedic Surgery | Admitting: Orthopedic Surgery

## 2021-01-22 DIAGNOSIS — M79661 Pain in right lower leg: Secondary | ICD-10-CM | POA: Insufficient documentation

## 2021-01-22 NOTE — CV Procedure (Signed)
RLE venous duplex completed. Attempted to call with preliminary results at [phone] - left voicemail. Call returned at [phone] and preliminary results given to Chales Abrahams Persons, PA.  Results can be found under chart review under CV PROC. 01/22/2021 10:00 AM Patryck Kilgore RVT, RDMS

## 2021-01-23 ENCOUNTER — Ambulatory Visit: Payer: BC Managed Care – PPO | Admitting: Physician Assistant

## 2021-01-23 ENCOUNTER — Ambulatory Visit (INDEPENDENT_AMBULATORY_CARE_PROVIDER_SITE_OTHER): Payer: BC Managed Care – PPO | Admitting: Physician Assistant

## 2021-01-23 ENCOUNTER — Encounter: Payer: Self-pay | Admitting: Physician Assistant

## 2021-01-23 DIAGNOSIS — M25562 Pain in left knee: Secondary | ICD-10-CM

## 2021-01-23 DIAGNOSIS — Z87828 Personal history of other (healed) physical injury and trauma: Secondary | ICD-10-CM

## 2021-01-23 NOTE — Progress Notes (Signed)
Office Visit Note   Patient: Ann Walter           Date of Birth: [birth year]           MRN: [id number] Visit Date: 01/23/2021              Requested by: Pearline Cables, MD [address] [place],  [place] [address] PCP: Pearline Cables, MD  Chief Complaint  Patient presents with  . Right Leg - Pain      HPI: Patient is a pleasant 55 year old woman who called in follow-up for her right calf swelling and pain.  I evaluated her earlier this week and had concerns for DVT.  Ultrasound was negative.  Prophylactically I did start her on an aspirin  Assessment & Plan: Visit Diagnoses: No diagnosis found.  Plan: Patient will wear a double extra large compression sock.  She should change these every day.  Follow-up for reevaluation in 2 weeks.  Follow-Up Instructions: No follow-ups on file.   Ortho Exam  Patient is alert, oriented, no adenopathy, well-dressed, normal affect, normal respiratory effort. Examination of her right lower extremity demonstrates well-healed surgical portals she has no effusion she has no erythema.  She does have calf swelling on the right but not as much as she did a few days ago.  Some tenderness in the calf posteriorly to palpation.  No ascending cellulitis or signs of infection.  Negative Homans' sign.  Calf measures 51 cm compression sock was applied  Imaging: No results found. No images are attached to the encounter.  Labs: Lab Results  Component Value Date   HGBA1C 6.3 12/03/2020   HGBA1C 6.2 (H) 08/09/2020   HGBA1C 6.2 09/01/2019   REPTSTATUS 03/27/2018 FINAL 03/25/2018   CULT (A) 03/25/2018    <10,000 COLONIES/mL INSIGNIFICANT GROWTH Performed at [facility], [address] [place], [place] [address]      Lab Results  Component Value Date   ALBUMIN 4.2 09/01/2019   ALBUMIN 3.8 03/25/2018   ALBUMIN 4.0 02/22/2018    No results found for: MG No results found for: VD25OH  No results found for:  PREALBUMIN CBC EXTENDED Latest Ref Rng & Units 08/09/2020 09/01/2019 03/25/2018  WBC 3.8 - 10.8 Thousand/uL 7.1 6.4 6.4  RBC 3.80 - 5.10 Million/uL 4.32 4.30 4.37  HGB 11.7 - 15.5 g/dL 82.9 56.2 13.0  HCT 86.5 - 45.0 % 38.8 38.7 38.5  PLT 140 - 400 Thousand/uL 270 236.0 249  NEUTROABS 1.7 - 7.7 K/uL - - -  LYMPHSABS 0.7 - 4.0 K/uL - - -     There is no height or weight on file to calculate BMI.  Orders:  No orders of the defined types were placed in this encounter.  No orders of the defined types were placed in this encounter.    Procedures: No procedures performed  Clinical Data: No additional findings.  ROS:  All other systems negative, except as noted in the HPI. Review of Systems  Objective: Vital Signs: LMP  (LMP Unknown) Comment: pt states she is not have periods  Specialty Comments:  No specialty comments available.  PMFS History: Patient Active Problem List   Diagnosis Date Noted  . History of meniscal tear   . Left shoulder pain 12/22/2017  . Upper airway cough syndrome 09/14/2017  . Pre-diabetes 11/22/2015  . Morbid obesity due to excess calories (HCC) 11/21/2015  . Ovarian cyst 02/28/2015  . Chest pain, atypical 03/08/2014  .  Heart murmur 03/08/2014  . Heart palpitations 03/08/2014  . GAD (generalized anxiety disorder) 02/13/2014  . Depression   . Essential hypertension    Past Medical History:  Diagnosis Date  . Allergy   . Benign hematuria 12/2009   WORKUP WITH DR. NESI WAS NEGATIVE  . Depression   . Family history of adverse reaction to anesthesia    My dad -  slow awaking  . Headache    Mirgraine  - years ago  . Heart murmur   . HTN (hypertension)   . Obesity   . Pre-diabetes   . 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 Sleep apnea   . UTI (urinary tract infection), uncomplicated     Family History  Problem Relation Age of Onset  . Diabetes Mother   . Heart disease Mother        HAD TRIPLE BYPASS  . Hypertension Mother   . Kidney disease  Father   . Breast cancer Sister   . Breast cancer Maternal Grandmother   . Cancer Maternal Grandmother        LYMPH NODE CANCER  . Cancer Maternal Aunt        COLORECTAL   . COPD Brother   . Kidney failure Brother   . Colon cancer Neg Hx     Past Surgical History:  Procedure Laterality Date  . CHOLECYSTECTOMY  10/2010  . CYSTECTOMY     x 2 from chest  . KNEE ARTHROSCOPY Right 12/19/2020   Procedure: RIGHT KNEE ARTHROSCOPY WITH DEBRIDEMENT;  Surgeon: Nadara Mustard, MD;  Location: [facility] OR;  Service: Orthopedics;  Laterality: Right;   Social History   Occupational History  . 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 Occupation: HAZARD/FLOOD    Employer: [employer]  Tobacco Use  . Smoking status: Never Smoker  . Smokeless tobacco: Never Used  Vaping Use  . Vaping Use: Never used  Substance and Sexual Activity  . Alcohol use: Not Currently    Alcohol/week: 0.0 standard drinks    Comment: occasionally  . Drug use: No  . Sexual activity: Yes    Partners: Male    Birth control/protection: Pill    Comment: INTERCOURSE AGE 1, SEXUAL PARTNERS MORE THAN 5

## 2021-02-06 ENCOUNTER — Ambulatory Visit (INDEPENDENT_AMBULATORY_CARE_PROVIDER_SITE_OTHER): Payer: BC Managed Care – PPO | Admitting: Physician Assistant

## 2021-02-06 ENCOUNTER — Encounter: Payer: Self-pay | Admitting: Physician Assistant

## 2021-02-06 DIAGNOSIS — M23322 Other meniscus derangements, posterior horn of medial meniscus, left knee: Secondary | ICD-10-CM

## 2021-02-06 MED ORDER — MELOXICAM 7.5 MG PO TABS: 7.5000 mg | ORAL_TABLET | Freq: Every day | ORAL | 1 refills | Status: DC

## 2021-02-06 NOTE — Addendum Note (Signed)
Addended by: Polly Cobia on: 02/06/2021 03:30 PM   Modules accepted: Orders

## 2021-02-06 NOTE — Progress Notes (Signed)
Office Visit Note   Patient: Ann Walter           Date of Birth: 1965/11/16           MRN: [id number] Visit Date: 02/06/2021              Requested by: Pearline Cables, MD [address] [place],  [place] [address] PCP: Pearline Cables, MD  No chief complaint on file.     HPI: Resents today 2 months status post right knee arthroscopy.  She is overall doing well.  She did have significant swelling and tenderness in the right calf and for the most part this is resolved.  She still has some swelling but no tenderness.  She did have a negative study for DVT.  Assessment & Plan: Visit Diagnoses: No diagnosis found.  Plan: She should continue work on strengthening.  I recommended wearing a compression sock but she found that it left a red ring around the top of her leg.  She may follow-up as needed.  I will call her in a refill for her meloxicam  Follow-Up Instructions: No follow-ups on file.   Ortho Exam  Patient is alert, oriented, no adenopathy, well-dressed, normal affect, normal respiratory effort. Well-healed surgical portals compartments are soft and nontender negative Denna Haggard' sign she does have some increased swelling compared to the left side though improved from previous exams.  No ascending cellulitis mild tenderness on the medial joint line no effusion no signs of infection  Imaging: No results found. No images are attached to the encounter.  Labs: Lab Results  Component Value Date   HGBA1C 6.3 12/03/2020   HGBA1C 6.2 (H) 08/09/2020   HGBA1C 6.2 09/01/2019   REPTSTATUS 03/27/2018 FINAL 03/25/2018   CULT (A) 03/25/2018    <10,000 COLONIES/mL INSIGNIFICANT GROWTH Performed at [facility], [address] [place], [place] [address]      Lab Results  Component Value Date   ALBUMIN 4.2 09/01/2019   ALBUMIN 3.8 03/25/2018   ALBUMIN 4.0 02/22/2018    No results found for: MG No results found for: VD25OH  No results found for:  PREALBUMIN CBC EXTENDED Latest Ref Rng & Units 08/09/2020 09/01/2019 03/25/2018  WBC 3.8 - 10.8 Thousand/uL 7.1 6.4 6.4  RBC 3.80 - 5.10 Million/uL 4.32 4.30 4.37  HGB 11.7 - 15.5 g/dL 70.3 50.0 93.8  HCT 18.2 - 45.0 % 38.8 38.7 38.5  PLT 140 - 400 Thousand/uL 270 236.0 249  NEUTROABS 1.7 - 7.7 K/uL - - -  LYMPHSABS 0.7 - 4.0 K/uL - - -     There is no height or weight on file to calculate BMI.  Orders:  No orders of the defined types were placed in this encounter.  No orders of the defined types were placed in this encounter.    Procedures: No procedures performed  Clinical Data: No additional findings.  ROS:  All other systems negative, except as noted in the HPI. Review of Systems  Objective: Vital Signs: LMP  (LMP Unknown) Comment: pt states she is not have periods  Specialty Comments:  No specialty comments available.  PMFS History: Patient Active Problem List   Diagnosis Date Noted  . History of meniscal tear   . Left shoulder pain 12/22/2017  . Upper airway cough syndrome 09/14/2017  . Pre-diabetes 11/22/2015  . Morbid obesity due to excess calories (HCC) 11/21/2015  . Ovarian cyst 02/28/2015  . Chest pain, atypical 03/08/2014  .  Heart murmur 03/08/2014  . Heart palpitations 03/08/2014  . GAD (generalized anxiety disorder) 02/13/2014  . Depression   . Essential hypertension    Past Medical History:  Diagnosis Date  . Allergy   . Benign hematuria 12/2009   WORKUP WITH DR. NESI WAS NEGATIVE  . Depression   . Family history of adverse reaction to anesthesia    My dad -  slow awaking  . Headache    Mirgraine  - years ago  . Heart murmur   . HTN (hypertension)   . Obesity   . Pre-diabetes   . 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 Sleep apnea   . UTI (urinary tract infection), uncomplicated     Family History  Problem Relation Age of Onset  . Diabetes Mother   . Heart disease Mother        HAD TRIPLE BYPASS  . Hypertension Mother   . Kidney disease  Father   . Breast cancer Sister   . Breast cancer Maternal Grandmother   . Cancer Maternal Grandmother        LYMPH NODE CANCER  . Cancer Maternal Aunt        COLORECTAL   . COPD Brother   . Kidney failure Brother   . Colon cancer Neg Hx     Past Surgical History:  Procedure Laterality Date  . CHOLECYSTECTOMY  10/2010  . CYSTECTOMY     x 2 from chest  . KNEE ARTHROSCOPY Right 12/19/2020   Procedure: RIGHT KNEE ARTHROSCOPY WITH DEBRIDEMENT;  Surgeon: Nadara Mustard, MD;  Location: [facility] OR;  Service: Orthopedics;  Laterality: Right;   Social History   Occupational History  . 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 Occupation: HAZARD/FLOOD    Employer: [employer]  Tobacco Use  . Smoking status: Never Smoker  . Smokeless tobacco: Never Used  Vaping Use  . Vaping Use: Never used  Substance and Sexual Activity  . Alcohol use: Not Currently    Alcohol/week: 0.0 standard drinks    Comment: occasionally  . Drug use: No  . Sexual activity: Yes    Partners: Male    Birth control/protection: Pill    Comment: INTERCOURSE AGE 1, SEXUAL PARTNERS MORE THAN 5

## 2021-02-25 ENCOUNTER — Encounter: Payer: Self-pay | Admitting: Orthopedic Surgery

## 2021-02-25 ENCOUNTER — Other Ambulatory Visit: Payer: Self-pay | Admitting: Physician Assistant

## 2021-02-25 MED ORDER — MELOXICAM 7.5 MG PO TABS: 7.5000 mg | ORAL_TABLET | Freq: Every day | ORAL | 1 refills | Status: DC

## 2021-03-06 ENCOUNTER — Encounter: Payer: Self-pay | Admitting: Family Medicine

## 2021-03-07 NOTE — Progress Notes (Signed)
[facility] [address] [place], [place] [address] [phone]  Date:  03/11/2021   Name:  Ann Walter   DOB:  1966/04/25   MRN:  [id number]  PCP:  Pearline Cables, MD    Chief Complaint: No chief complaint on file.   History of Present Illness:  Ann Walter is a 55 y.o. very pleasant female patient who presents with the following:  Patient seen today for a follow-up visit.  Last visit with myself virtually in February when patient was ill with COVID-19 History of hypertension, prediabetes, obesity, depression and anxiety  Virtual visit today-patient location is home, provider location is office Patient identity confirmed with 2 factors, she gives consent for virtual visit today.  The patient and myself are present on the call  Lab Results  Component Value Date   HGBA1C 6.3 12/03/2020   COVID-19 series Pap is due this fall  She sent me a recent message about possibly changing her depression medication and/or changing her Xanax  She is currently taking Pristiq 50 mg, alprazolam 0.5 3 times daily as needed  Last filled 90 supply on April 14  She notes that she is working a lot- often 7 days a week She had knee surgery in February and feels that she had to get back to work too early as she was not getting paid -currently is still painf  Her step-daughter moved back to this area but is not in their home any longer.  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  They are understaffed and she is working more than she should/ wants to at this  time  Her sleep is generally ok, she takes a xanax prior to bed and this does help  Her appetite is deceased "but I need to lose weight anyway" She does not have much time to relax or exercise    Patient Active Problem List   Diagnosis Date Noted  . History of meniscal tear   . Left shoulder pain 12/22/2017  . Upper airway cough syndrome 09/14/2017  . Pre-diabetes 11/22/2015  . Morbid obesity due to excess calories (HCC) 11/21/2015  . Ovarian cyst 02/28/2015  . Chest pain, atypical 03/08/2014  . Heart murmur 03/08/2014  . Heart palpitations 03/08/2014  . GAD (generalized anxiety disorder) 02/13/2014  . Depression   . Essential hypertension     Past Medical History:  Diagnosis Date  . Allergy   . Benign hematuria 12/2009   WORKUP WITH DR. NESI WAS NEGATIVE  . Depression   . Family history of adverse reaction to anesthesia    My dad -  slow awaking  . Headache    Mirgraine  - years ago  . Heart murmur   . HTN (hypertension)   . Obesity   . Pre-diabetes   . 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 Sleep apnea   . UTI (urinary tract infection), uncomplicated     Past Surgical History:  Procedure Laterality Date  .  CHOLECYSTECTOMY  10/2010  . CYSTECTOMY     x 2 from chest  . KNEE ARTHROSCOPY Right 12/19/2020   Procedure: RIGHT KNEE ARTHROSCOPY WITH DEBRIDEMENT;  Surgeon: Nadara Mustard, MD;  Location: [facility] OR;  Service: Orthopedics;  Laterality: Right;    Social History   Tobacco Use  . Smoking status: Never Smoker  . Smokeless tobacco: Never Used  Vaping Use  . Vaping Use: Never used  Substance Use Topics  . Alcohol use: Not Currently    Alcohol/week: 0.0 standard drinks    Comment: occasionally  . Drug use: No    Family History  Problem Relation Age of Onset  . Diabetes Mother   . Heart disease Mother        HAD TRIPLE BYPASS  . Hypertension Mother   . Kidney disease Father   . Breast cancer Sister   . Breast cancer Maternal Grandmother   . Cancer Maternal  Grandmother        LYMPH NODE CANCER  . Cancer Maternal Aunt        COLORECTAL   . COPD Brother   . Kidney failure Brother   . Colon cancer Neg Hx     Allergies  Allergen Reactions  . Wellbutrin [Bupropion] Swelling    Throat swelling   . Sulfonamide Derivatives Other (See Comments)    Unknown per pt, it's been a long time    Medication list has been reviewed and updated.  Current Outpatient Medications on File Prior to Visit  Medication Sig Dispense Refill  . ALPRAZolam (XANAX) 0.5 MG tablet Take 1 tablet by mouth three times daily as needed 90 tablet 2  . cholecalciferol (VITAMIN D3) 25 MCG (1000 UNIT) tablet Take 1,000 Units by mouth daily.    Marland Kitchen desvenlafaxine (PRISTIQ) 50 MG 24 hr tablet Take 1 tablet by mouth once daily (Patient taking differently: Take 50 mg by mouth daily.) 90 tablet 0  . famotidine (PEPCID) 20 MG tablet Take 20 mg by mouth daily as needed for heartburn or indigestion.    . meloxicam (MOBIC) 7.5 MG tablet Take 1 tablet (7.5 mg total) by mouth daily. 30 tablet 1  . metFORMIN (GLUCOPHAGE) 500 MG tablet Take 1 tablet by mouth once daily (Patient taking differently: Take 500 mg by mouth daily with breakfast.) 90 tablet 0  . Multiple Vitamins-Minerals (MULTIVITAMIN WITH MINERALS) tablet Take 1 tablet by mouth daily.    Marland Kitchen oxyCODONE-acetaminophen (PERCOCET) 5-325 MG tablet Take 1 tablet by mouth every 4 (four) hours as needed. 10 tablet 0  . telmisartan (MICARDIS) 80 MG tablet Take 1 tablet (80 mg total) by mouth daily. 90 tablet 1  . zinc gluconate 50 MG tablet Take 50 mg by mouth daily.     No current facility-administered medications on file prior to visit.    Review of Systems:  As per HPI- otherwise negative.   Physical Examination: There were no vitals filed for this visit. There were no vitals filed for this visit. There is no height or weight on file to calculate BMI. Ideal Body Weight:    Patient observed via video monitor.  She looks well,  normal self.  No shortness of breath or distress is noted  Assessment and Plan: GAD (generalized anxiety disorder) - Plan: desvenlafaxine (PRISTIQ) 100 MG 24 hr tablet  Depression, unspecified depression type - Plan: desvenlafaxine (PRISTIQ) 100 MG 24 hr tablet   Virtual visit today to discuss concerns of anxiety/depression Angelique Blonder has felt a little with these issues for  a long time, somewhat worse right now due to her work situation.  We discussed potentially increasing her Pristiq, or perhaps changing alprazolam to clonazepam For the time being she would like to try increasing her Pristiq, I called in a prescription for 100 mg. We plan to visit in about 1 month to check on her progress, she will let me know sooner if not doing okay She denies any suicidal ideation.  Video used for duration of visit today  Signed Abbe Amsterdam, MD

## 2021-03-11 ENCOUNTER — Other Ambulatory Visit: Payer: Self-pay

## 2021-03-11 ENCOUNTER — Telehealth (INDEPENDENT_AMBULATORY_CARE_PROVIDER_SITE_OTHER): Payer: BC Managed Care – PPO | Admitting: Family Medicine

## 2021-03-11 DIAGNOSIS — F411 Generalized anxiety disorder: Secondary | ICD-10-CM

## 2021-03-11 DIAGNOSIS — F32A Depression, unspecified: Secondary | ICD-10-CM | POA: Diagnosis not present

## 2021-03-11 MED ORDER — DESVENLAFAXINE SUCCINATE ER 100 MG PO TB24: 100.0000 mg | ORAL_TABLET | Freq: Every day | ORAL | 3 refills | Status: DC

## 2021-03-16 ENCOUNTER — Other Ambulatory Visit: Payer: Self-pay | Admitting: Family Medicine

## 2021-03-16 DIAGNOSIS — I10 Essential (primary) hypertension: Secondary | ICD-10-CM

## 2021-04-10 ENCOUNTER — Other Ambulatory Visit: Payer: Self-pay | Admitting: Family Medicine

## 2021-04-10 DIAGNOSIS — F411 Generalized anxiety disorder: Secondary | ICD-10-CM

## 2021-04-10 NOTE — Telephone Encounter (Signed)
Patient is requesting a refill of the following medications: Requested Prescriptions   Pending Prescriptions Disp Refills   ALPRAZolam (XANAX) 0.5 MG tablet [Pharmacy Med Name: ALPRAZolam 0.5 MG Oral Tablet] 90 tablet 0    Sig: Take 1 tablet by mouth three times daily as needed    Date of patient request: 04/10/21 Last office visit: 03/08/21 VV Date of last refill: 01/07/21  Last refill amount: 90 + 0 Follow up time period per chart: none yet.

## 2021-05-01 NOTE — Progress Notes (Deleted)
[facility] [address] [place], [place] [address] [phone]  Date:  05/08/2021   Name:  Ann Walter   DOB:  [birth year]   MRN:  [id number]  PCP:  Pearline Cables, MD    Chief Complaint: No chief complaint on file.   History of Present Illness:  Ann Walter is a 55 y.o. very pleasant female patient who presents with the following:  Pt seen today with concern of   Last visit with myself virtually in May- History of hypertension, prediabetes, obesity, depression and anxiety At our last visit she was stressed about issues at work and was more irritable than typical for her  I increased her pristiq from 50 to 100 mg at that time  Lab Results  Component Value Date   HGBA1C 6.3 12/03/2020   Covid series Pap is due soon- can update today if shes likes Mammo can be updated Shingrix  Lipids in October  Alprazolam 0.5 3 times daily Vitamin D over-the-counter Pristiq Metformin Telmisartan  04/10/2021  04/10/2021   1  Alprazolam 0.5 Mg Tablet  90.00  30  Je Cop  [id number]  Wal (2001)  0/1  3.00 LME  Comm Ins      03/11/2021  01/07/2021   1  Alprazolam 0.5 Mg Tablet  90.00  30  Je Cop  [id number]  Wal (2001)  2/2  3.00 LME  Comm Ins      02/07/2021  01/07/2021   1  Alprazolam 0.5 Mg Tablet  90.00  30  Je Cop           Patient Active Problem List   Diagnosis Date Noted   History of meniscal tear    Left shoulder pain 12/22/2017   Upper airway cough syndrome 09/14/2017   Pre-diabetes 11/22/2015   Morbid obesity due to excess calories (HCC) 11/21/2015   Ovarian cyst 02/28/2015   Chest pain, atypical 03/08/2014   Heart murmur 03/08/2014   Heart palpitations 03/08/2014   GAD (generalized anxiety disorder) 02/13/2014   Depression    Essential hypertension     Past Medical History:  Diagnosis Date   Allergy    Benign hematuria 12/2009   WORKUP WITH DR. Brunilda Payor WAS NEGATIVE   Depression    Family  history of adverse reaction to anesthesia    My dad -  slow awaking   Headache    Mirgraine  - years ago   Heart murmur    HTN (hypertension)    Obesity    Pre-diabetes    Ringing in ears, bilateral    Sleep apnea    UTI (urinary tract infection), uncomplicated     Past Surgical History:  Procedure Laterality Date   CHOLECYSTECTOMY  10/2010   CYSTECTOMY     x 2 from chest   KNEE ARTHROSCOPY Right 12/19/2020   Procedure: RIGHT KNEE ARTHROSCOPY WITH DEBRIDEMENT;  Surgeon: Nadara Mustard, MD;  Location: [facility] OR;  Service: Orthopedics;  Laterality: Right;    Social History   Tobacco Use   Smoking status: Never   Smokeless tobacco: Never  Vaping Use   Vaping Use: Never used  Substance Use Topics   Alcohol use: Not Currently    Alcohol/week: 0.0 standard drinks    Comment: occasionally   Drug use: No    Family History  Problem Relation Age of Onset   Diabetes Mother    Heart disease Mother  HAD TRIPLE BYPASS   Hypertension Mother    Kidney disease Father    Breast cancer Sister    Breast cancer Maternal Grandmother    Cancer Maternal Grandmother        LYMPH NODE CANCER   Cancer Maternal Aunt        COLORECTAL    COPD Brother    Kidney failure Brother    Colon cancer Neg Hx     Allergies  Allergen Reactions   Wellbutrin [Bupropion] Swelling    Throat swelling    Sulfonamide Derivatives Other (See Comments)    Unknown per pt, it's been a long time    Medication list has been reviewed and updated.  Current Outpatient Medications on File Prior to Visit  Medication Sig Dispense Refill   ALPRAZolam (XANAX) 0.5 MG tablet Take 1 tablet by mouth three times daily as needed 90 tablet 1   cholecalciferol (VITAMIN D3) 25 MCG (1000 UNIT) tablet Take 1,000 Units by mouth daily.     desvenlafaxine (PRISTIQ) 100 MG 24 hr tablet Take 1 tablet (100 mg total) by mouth daily. 30 tablet 3   famotidine (PEPCID) 20 MG tablet Take 20 mg by mouth daily as needed for  heartburn or indigestion.     meloxicam (MOBIC) 7.5 MG tablet Take 1 tablet (7.5 mg total) by mouth daily. 30 tablet 1   metFORMIN (GLUCOPHAGE) 500 MG tablet Take 1 tablet by mouth once daily (Patient taking differently: Take 500 mg by mouth daily with breakfast.) 90 tablet 0   Multiple Vitamins-Minerals (MULTIVITAMIN WITH MINERALS) tablet Take 1 tablet by mouth daily.     oxyCODONE-acetaminophen (PERCOCET) 5-325 MG tablet Take 1 tablet by mouth every 4 (four) hours as needed. 10 tablet 0   telmisartan (MICARDIS) 80 MG tablet Take 1 tablet by mouth once daily 90 tablet 2   zinc gluconate 50 MG tablet Take 50 mg by mouth daily.     No current facility-administered medications on file prior to visit.    Review of Systems:  As per HPI- otherwise negative.   Physical Examination: There were no vitals filed for this visit. There were no vitals filed for this visit. There is no height or weight on file to calculate BMI. Ideal Body Weight:    GEN: no acute distress. HEENT: Atraumatic, Normocephalic.  Ears and Nose: No external deformity. CV: RRR, No M/G/R. No JVD. No thrill. No extra heart sounds. PULM: CTA B, no wheezes, crackles, rhonchi. No retractions. No resp. distress. No accessory muscle use. ABD: S, NT, ND, +BS. No rebound. No HSM. EXTR: No c/c/e PSYCH: Normally interactive. Conversant.    Assessment and Plan: ***  This visit occurred during the SARS-CoV-2 public health emergency.  Safety protocols were in place, including screening questions prior to the visit, additional usage of staff PPE, and extensive cleaning of exam room while observing appropriate contact time as indicated for disinfecting solutions.   Signed Abbe Amsterdam, MD

## 2021-05-08 ENCOUNTER — Ambulatory Visit: Payer: BC Managed Care – PPO | Admitting: Internal Medicine

## 2021-05-08 ENCOUNTER — Ambulatory Visit: Payer: BC Managed Care – PPO | Admitting: Family Medicine

## 2021-05-09 NOTE — Progress Notes (Addendum)
[facility] [address], [address] [place], [place] [address] [phone]  Date:  05/10/2021   Name:  Ann Walter   DOB:  05-24-1966   MRN:  [id number]  PCP:  Pearline Cables, MD    Chief Complaint: Stress (Very stressed at work. )   History of Present Illness:  Ann Walter is a 55 y.o. very pleasant female patient who presents with the following:  Pt seen today with concern of stress History of pre-diabetes, HTN Last seen by myself virtually in May: Virtual visit today to discuss concerns of anxiety/depression Ann Walter has felt a little with these issues for a long time, somewhat worse right now due to her work situation.  We discussed potentially increasing her Pristiq, or perhaps changing alprazolam to clonazepam For the time being she would like to try increasing her Pristiq, I called in a prescription for 100 mg. We plan to visit in about 1 month to check on her progress, she will let me know sooner if not doing okay  Today pt is tearful and unfortunately seems miserable as usual She states that she hates her job and is "sick of it" She states that she is often working long hours,many days in a row with very little time off She is currently a Public relations account executive and does not wish to have this level of responsibility any longer She expresses a lot of anger and frustration with her work, and admits to being easily irritated by coworkers and customers.  For example, she notes that she becomes extremely angry if a customer asks if she works at [employer], she feels that should be obvious since she is wearing her work uniform. She does states she is actively trying to find a new job She denies any suicidal or homicidal ideation  In the past she has complained a lot about her home life.  However, she now states that things at home on her much better  She is using her alprazolam still- this does  seem to help her manage her anxiety We talked about changing her Pristiq another medication.  However, at this time it seems her symptoms are situational-we both doubt that changing her medication will make much of a difference unfortunately   At the end of her visit today Ann Walter states that she had an episode of chest pain 2 days ago.  It lasted for about 5 minutes while she was at work, substernal.  It has since completely resolved No shortness of breath  Lab Results  Component Value Date   HGBA1C 6.2 05/10/2021   BP Readings from Last 3 Encounters:  05/10/21 (!) 144/90  12/19/20 (!) 163/91  12/03/20 (!) 148/96   Patient Active Problem List   Diagnosis Date Noted   History of meniscal tear    Left shoulder pain 12/22/2017   Upper airway cough syndrome 09/14/2017   Pre-diabetes 11/22/2015   Morbid obesity due to excess calories (HCC) 11/21/2015   Ovarian cyst 02/28/2015   Chest pain, atypical 03/08/2014   Heart murmur 03/08/2014   Heart palpitations 03/08/2014   GAD (generalized anxiety disorder) 02/13/2014   Depression    Essential hypertension     Past Medical History:  Diagnosis Date   Allergy    Benign hematuria 12/2009   WORKUP WITH DR. Brunilda Payor WAS NEGATIVE   Depression    Family history of adverse reaction to anesthesia  My dad -  slow awaking   Headache    Mirgraine  - years ago   Heart murmur    HTN (hypertension)    Obesity    Pre-diabetes    Ringing in ears, bilateral    Sleep apnea    UTI (urinary tract infection), uncomplicated     Past Surgical History:  Procedure Laterality Date   CHOLECYSTECTOMY  10/2010   CYSTECTOMY     x 2 from chest   KNEE ARTHROSCOPY Right 12/19/2020   Procedure: RIGHT KNEE ARTHROSCOPY WITH DEBRIDEMENT;  Surgeon: Nadara Mustard, MD;  Location: [facility] OR;  Service: Orthopedics;  Laterality: Right;    Social History   Tobacco Use   Smoking status: Never   Smokeless tobacco: Never  Vaping Use   Vaping Use: Never used   Substance Use Topics   Alcohol use: Not Currently    Alcohol/week: 0.0 standard drinks    Comment: occasionally   Drug use: No    Family History  Problem Relation Age of Onset   Diabetes Mother    Heart disease Mother        HAD TRIPLE BYPASS   Hypertension Mother    Kidney disease Father    Breast cancer Sister    Breast cancer Maternal Grandmother    Cancer Maternal Grandmother        LYMPH NODE CANCER   Cancer Maternal Aunt        COLORECTAL    COPD Brother    Kidney failure Brother    Colon cancer Neg Hx     Allergies  Allergen Reactions   Wellbutrin [Bupropion] Swelling    Throat swelling    Sulfonamide Derivatives Other (See Comments)    Unknown per pt, it's been a long time    Medication list has been reviewed and updated.  Current Outpatient Medications on File Prior to Visit  Medication Sig Dispense Refill   desvenlafaxine (PRISTIQ) 100 MG 24 hr tablet Take 1 tablet (100 mg total) by mouth daily. 30 tablet 3   famotidine (PEPCID) 20 MG tablet Take 20 mg by mouth daily as needed for heartburn or indigestion.     telmisartan (MICARDIS) 80 MG tablet Take 1 tablet by mouth once daily 90 tablet 2   No current facility-administered medications on file prior to visit.    Review of Systems:  As per HPI- otherwise negative.   Physical Examination: Vitals:   05/10/21 1046  BP: (!) 144/90  Pulse: 78  Temp: 98.1 F (36.7 C)  SpO2: 98%   Vitals:   05/10/21 1046  Weight: 255 lb 3.2 oz (115.8 kg)  Height: 5\' 3"  (1.6 m)   Body mass index is 45.21 kg/m. Ideal Body Weight: Weight in (lb) to have BMI = 25: 140.8  GEN: no acute distress.  Obese, tearful HEENT: Atraumatic, Normocephalic.  Ears and Nose: No external deformity. CV: RRR, No M/G/R. No JVD. No thrill. No extra heart sounds. PULM: CTA B, no wheezes, crackles, rhonchi. No retractions. No resp. distress. No accessory muscle use. EXTR: No c/c/e PSYCH: Normally interactive. Conversant.   EKG:  Normal sinus rhythm.  Rate of 61.  There is a slight downgoing T wave in V2 which is changed C/w tracing from 11/2017-otherwise no significant changes noted Assessment and Plan: Chest pain, unspecified type - Plan: EKG 12-Lead, CBC, Troponin I (High Sensitivity), CANCELED: Troponin I (High Sensitivity), CANCELED: Troponin I (High Sensitivity)  Pre-diabetes - Plan: metFORMIN (GLUCOPHAGE) 500 MG tablet, Comprehensive metabolic panel,  Hemoglobin A1c  GAD (generalized anxiety disorder) - Plan: ALPRAZolam (XANAX) 0.5 MG tablet Patient seen today for follow-up of anxiety and depression.  Unfortunately Ann Walter typically complains of general dissatisfaction with her life circumstances.  I have offered her support, we talked about strategies for finding a job she might like better and also techniques for managing her anger.  She denies any intent of self-harm or harm to others. We will have her continue Pristiq, I refilled her alprazolam I did encourage her to consider talking to a counselor  Follow-up on routine blood work today  She also mentions chest pain 2 days ago, now resolved which is atypical in character.  EKG today is reassuring.  I have also ordered a stat troponin This visit occurred during the SARS-CoV-2 public health emergency.  Safety protocols were in place, including screening questions prior to the visit, additional usage of staff PPE, and extensive cleaning of exam room while observing appropriate contact time as indicated for disinfecting solutions.   Signed Abbe Amsterdam, MD  Received her labs as below, message to patient  Results for orders placed or performed in visit on 05/10/21  CBC  Result Value Ref Range   WBC 6.4 4.0 - 10.5 K/uL   RBC 4.07 3.87 - 5.11 Mil/uL   Platelets 221.0 150.0 - 400.0 K/uL   Hemoglobin 12.3 12.0 - 15.0 g/dL   HCT 41.3 24.4 - 01.0 %   MCV 89.9 78.0 - 100.0 fl   MCHC 33.5 30.0 - 36.0 g/dL   RDW 27.2 53.6 - 64.4 %  Comprehensive metabolic panel   Result Value Ref Range   Sodium 139 135 - 145 mEq/L   Potassium 4.4 3.5 - 5.1 mEq/L   Chloride 103 96 - 112 mEq/L   CO2 29 19 - 32 mEq/L   Glucose, Bld 93 70 - 99 mg/dL   BUN 18 6 - 23 mg/dL   Creatinine, Ser 0.34 0.40 - 1.20 mg/dL   Total Bilirubin 0.3 0.2 - 1.2 mg/dL   Alkaline Phosphatase 104 39 - 117 U/L   AST 13 0 - 37 U/L   ALT 13 0 - 35 U/L   Total Protein 6.6 6.0 - 8.3 g/dL   Albumin 4.1 3.5 - 5.2 g/dL   GFR 74.25 >95.63 mL/min   Calcium 9.1 8.4 - 10.5 mg/dL  Hemoglobin O7F  Result Value Ref Range   Hgb A1c MFr Bld 6.2 4.6 - 6.5 %  Troponin I (High Sensitivity)  Result Value Ref Range   High Sens Troponin I 4 2 - 17 ng/L

## 2021-05-10 ENCOUNTER — Other Ambulatory Visit: Payer: Self-pay

## 2021-05-10 ENCOUNTER — Encounter: Payer: Self-pay | Admitting: Family Medicine

## 2021-05-10 ENCOUNTER — Ambulatory Visit (INDEPENDENT_AMBULATORY_CARE_PROVIDER_SITE_OTHER): Payer: BC Managed Care – PPO | Admitting: Family Medicine

## 2021-05-10 VITALS — BP 144/90 | HR 78 | Temp 98.1°F | Ht 63.0 in | Wt 255.2 lb

## 2021-05-10 DIAGNOSIS — R7303 Prediabetes: Secondary | ICD-10-CM

## 2021-05-10 DIAGNOSIS — F411 Generalized anxiety disorder: Secondary | ICD-10-CM | POA: Diagnosis not present

## 2021-05-10 DIAGNOSIS — R079 Chest pain, unspecified: Secondary | ICD-10-CM | POA: Diagnosis not present

## 2021-05-10 LAB — CBC
HCT: 36.6 % (ref 36.0–46.0)
Hemoglobin: 12.3 g/dL (ref 12.0–15.0)
MCHC: 33.5 g/dL (ref 30.0–36.0)
MCV: 89.9 fl (ref 78.0–100.0)
Platelets: 221 10*3/uL (ref 150.0–400.0)
RBC: 4.07 Mil/uL (ref 3.87–5.11)
RDW: 14 % (ref 11.5–15.5)
WBC: 6.4 10*3/uL (ref 4.0–10.5)

## 2021-05-10 LAB — TROPONIN I (HIGH SENSITIVITY): High Sens Troponin I: 4 ng/L (ref 2–17)

## 2021-05-10 LAB — COMPREHENSIVE METABOLIC PANEL
ALT: 13 U/L (ref 0–35)
AST: 13 U/L (ref 0–37)
Albumin: 4.1 g/dL (ref 3.5–5.2)
Alkaline Phosphatase: 104 U/L (ref 39–117)
BUN: 18 mg/dL (ref 6–23)
CO2: 29 mEq/L (ref 19–32)
Calcium: 9.1 mg/dL (ref 8.4–10.5)
Chloride: 103 mEq/L (ref 96–112)
Creatinine, Ser: 0.84 mg/dL (ref 0.40–1.20)
GFR: 78.16 mL/min (ref >60.00)
Glucose, Bld: 93 mg/dL (ref 70–99)
Potassium: 4.4 mEq/L (ref 3.5–5.1)
Sodium: 139 mEq/L (ref 135–145)
Total Bilirubin: 0.3 mg/dL (ref 0.2–1.2)
Total Protein: 6.6 g/dL (ref 6.0–8.3)

## 2021-05-10 LAB — HEMOGLOBIN A1C: Hgb A1c MFr Bld: 6.2 % (ref 4.6–6.5)

## 2021-05-10 MED ORDER — METFORMIN HCL 500 MG PO TABS
500.0000 mg | ORAL_TABLET | Freq: Every day | ORAL | 3 refills | Status: DC
Start: 2021-05-10 — End: 2022-03-10

## 2021-05-10 MED ORDER — ALPRAZOLAM 0.5 MG PO TABS: 0.5000 mg | ORAL_TABLET | Freq: Three times a day (TID) | ORAL | 2 refills | Status: DC | PRN

## 2021-05-10 NOTE — Patient Instructions (Signed)
It was good to see you again today- I am sorry that you are having such a hard time!   I refilled your alprazolam today I would encourage you to see a counselor to work on your anger and to make a plan for feeling better  Your EKG looks ok- I am going to check your troponin to make sure no sign of heart muscle distress

## 2021-05-28 DIAGNOSIS — Z20822 Contact with and (suspected) exposure to covid-19: Secondary | ICD-10-CM | POA: Diagnosis not present

## 2021-06-12 DIAGNOSIS — G4733 Obstructive sleep apnea (adult) (pediatric): Secondary | ICD-10-CM | POA: Diagnosis not present

## 2021-07-18 ENCOUNTER — Other Ambulatory Visit: Payer: Self-pay | Admitting: Family Medicine

## 2021-07-18 DIAGNOSIS — F32A Depression, unspecified: Secondary | ICD-10-CM

## 2021-07-18 DIAGNOSIS — F411 Generalized anxiety disorder: Secondary | ICD-10-CM

## 2021-09-09 ENCOUNTER — Other Ambulatory Visit: Payer: Self-pay | Admitting: Family Medicine

## 2021-09-09 DIAGNOSIS — F411 Generalized anxiety disorder: Secondary | ICD-10-CM

## 2021-11-04 ENCOUNTER — Ambulatory Visit: Payer: BC Managed Care – PPO | Admitting: Family Medicine

## 2021-11-08 ENCOUNTER — Encounter: Payer: Self-pay | Admitting: Family Medicine

## 2021-11-08 DIAGNOSIS — F411 Generalized anxiety disorder: Secondary | ICD-10-CM

## 2021-11-08 MED ORDER — ALPRAZOLAM 0.5 MG PO TABS: 0.5000 mg | ORAL_TABLET | Freq: Three times a day (TID) | ORAL | 0 refills | Status: DC | PRN

## 2021-11-19 NOTE — Progress Notes (Addendum)
[facility] [address] [place], [place] [address] [phone]  Date:  11/25/2021   Name:  Ann Walter   DOB:  11-20-1965   MRN:  [id number]  PCP:  Darreld Mclean, MD    Chief Complaint: medication follow up (Concerns/ questions: 1. pt says she is having issues with her stomach again.2. pt has new insurance now and would like to try to get weight loss shot again. /Flu shot today:yes/)   History of Present Illness:  Ann Walter is a 56 y.o. very pleasant female patient who presents with the following:  Patient seen today for periodic follow-up Most recent visit with myself was in July History of prediabetes, hypertension, chronic anxiety/depression/dysthymia/irritability and anger, often related to her job Pt notes she was out of her pristiq for a few days over the holidays.  She is now taking this again-she does feel that it helps her overall She had a hard time over the holidays- she was missing family members who have passed away.    She got a new job- it is not wonderful but is much better than the [employer].  Ann Walter is trying to have a more positive outlook on life and she does seem to much better mood than typical today  COVID vaccination-recommended Shingles vaccine Flu shot- give today Pap smear is now due- she prefers to do with GYN and will schedule  Mammogram is due-ordered for today  CMP, CBC, A1c done in July-can update lipids, thyroid, CBC  Wt Readings from Last 3 Encounters:  11/25/21 265 lb 6.4 oz (120.4 kg)  05/10/21 255 lb 3.2 oz (115.8 kg)  12/19/20 260 lb (117.9 kg)   She is doing a bit more walking at her new job-she is trying to exercise and watch her diet, she would like to lose weight She is interested in using a GLP-1 for weight loss No contraindication upon discussion We have samples of Saxenda, she would like to give this a try Patient Active Problem List   Diagnosis  Date Noted   History of meniscal tear    Left shoulder pain 12/22/2017   Upper airway cough syndrome 09/14/2017   Pre-diabetes 11/22/2015   Morbid obesity due to excess calories ([place]) 11/21/2015   Ovarian cyst 02/28/2015   Chest pain, atypical 03/08/2014   Heart murmur 03/08/2014   Heart palpitations 03/08/2014   GAD (generalized anxiety disorder) 02/13/2014   Depression    Essential hypertension     Past Medical History:  Diagnosis Date   Allergy    Benign hematuria 12/2009   WORKUP WITH DR. Janice Norrie WAS NEGATIVE   Depression    Family history of adverse reaction to anesthesia    My dad -  slow awaking   Headache    Mirgraine  - years ago   Heart murmur    HTN (hypertension)    Obesity    Pre-diabetes    Ringing in ears, bilateral    Sleep apnea    UTI (urinary tract infection), uncomplicated     Past Surgical History:  Procedure Laterality Date   CHOLECYSTECTOMY  10/2010   CYSTECTOMY     x 2 from chest   KNEE ARTHROSCOPY Right 12/19/2020   Procedure: RIGHT KNEE ARTHROSCOPY WITH DEBRIDEMENT;  Surgeon: Newt Minion, MD;  Location: [place] [facility];  Service: Orthopedics;  Laterality: Right;    Social History   Tobacco Use   Smoking status:  Never   Smokeless tobacco: Never  Vaping Use   Vaping Use: Never used  Substance Use Topics   Alcohol use: Not Currently    Alcohol/week: 0.0 standard drinks    Comment: occasionally   Drug use: No    Family History  Problem Relation Age of Onset   Diabetes Mother    Heart disease Mother        HAD TRIPLE BYPASS   Hypertension Mother    Kidney disease Father    Breast cancer Sister    Breast cancer Maternal Grandmother    Cancer Maternal Grandmother        LYMPH NODE CANCER   Cancer Maternal Aunt        COLORECTAL    COPD Brother    Kidney failure Brother    Colon cancer Neg Hx     Allergies  Allergen Reactions   Wellbutrin [Bupropion] Swelling    Throat swelling    Sulfonamide Derivatives Other (See Comments)     Unknown per pt, it's been a long time    Medication list has been reviewed and updated.  Current Outpatient Medications on File Prior to Visit  Medication Sig Dispense Refill   ALPRAZolam (XANAX) 0.5 MG tablet Take 1 tablet (0.5 mg total) by mouth 3 (three) times daily as needed. 90 tablet 0   desvenlafaxine (PRISTIQ) 100 MG 24 hr tablet Take 1 tablet by mouth once daily 90 tablet 1   famotidine (PEPCID) 20 MG tablet Take 20 mg by mouth daily as needed for heartburn or indigestion.     metFORMIN (GLUCOPHAGE) 500 MG tablet Take 1 tablet (500 mg total) by mouth daily. 90 tablet 3   telmisartan (MICARDIS) 80 MG tablet Take 1 tablet by mouth once daily 90 tablet 2   No current facility-administered medications on file prior to visit.    Review of Systems:  As per HPI- otherwise negative.   Physical Examination: Vitals:   11/25/21 0943  BP: 122/72  Pulse: 72  Resp: 18  Temp: 97.9 F (36.6 C)  SpO2: 97%   Vitals:   11/25/21 0943  Weight: 265 lb 6.4 oz (120.4 kg)  Height: 5\' 3"  (1.6 m)   Body mass index is 47.01 kg/m. Ideal Body Weight: Weight in (lb) to have BMI = 25: 140.8  GEN: no acute distress. Obese, looks well  HEENT: Atraumatic, Normocephalic.  Ears and Nose: No external deformity. CV: RRR, No M/G/R. No JVD. No thrill. No extra heart sounds. PULM: CTA B, no wheezes, crackles, rhonchi. No retractions. No resp. distress. No accessory muscle use. ABD: S, NT, ND. No rebound. No HSM. EXTR: No c/c/e PSYCH: Normally interactive. Conversant.    Assessment and Plan: GAD (generalized anxiety disorder)  Pre-diabetes - Plan: Basic metabolic panel, Hemoglobin A1c  Depression, unspecified depression type  Essential hypertension - Plan: CBC, Basic metabolic panel  Screening for thyroid disorder - Plan: TSH  Screening for hyperlipidemia - Plan: Lipid panel  Fatigue, unspecified type - Plan: VITAMIN D 25 Hydroxy (Vit-D Deficiency, Fractures), TSH  Need for influenza  vaccination - Plan: Flu Vaccine QUAD 6+ mos PF IM (Fluarix Quad PF)  Screening mammogram for breast cancer - Plan: MM 3D SCREEN BREAST BILATERAL  Class 3 severe obesity with serious comorbidity and body mass index (BMI) of 45.0 to 49.9 in adult, unspecified obesity type ([place]) - Plan: Liraglutide -Weight Management (SAXENDA) 18 MG/3ML SOPN, Insulin Pen Needle (PEN NEEDLES) 32G X 4 MM MISC  Following up today.  Blood  pressure under good control, continue Micardis Check on A1c today, taking metformin for prediabetes Other routine labs are pending as above Flu shot given Recommend Pap Order mammogram Discussed starting Saxenda and went over sample pen with patient.  She feels comfortable giving an injection at home. She will keep me posted about how this is working for her and any side effects Will plan further follow- up pending labs.  Signed Lamar Blinks, MD  Received her labs as below, message to patient  Results for orders placed or performed in visit on 11/25/21  CBC  Result Value Ref Range   WBC 6.3 4.0 - 10.5 K/uL   RBC 4.28 3.87 - 5.11 Mil/uL   Platelets 208.0 150.0 - 400.0 K/uL   Hemoglobin 12.7 12.0 - 15.0 g/dL   HCT 38.5 36.0 - 46.0 %   MCV 90.0 78.0 - 100.0 fl   MCHC 32.9 30.0 - 36.0 g/dL   RDW 14.2 11.5 - [id number] %  Basic metabolic panel  Result Value Ref Range   Sodium 140 135 - 145 mEq/L   Potassium 4.8 3.5 - 5.1 mEq/L   Chloride 105 96 - 112 mEq/L   CO2 26 19 - 32 mEq/L   Glucose, Bld 88 70 - 99 mg/dL   BUN 22 6 - 23 mg/dL   Creatinine, Ser 0.87 0.40 - 1.20 mg/dL   GFR 74.65 >60.00 mL/min   Calcium 9.2 8.4 - 10.5 mg/dL  Hemoglobin A1c  Result Value Ref Range   Hgb A1c MFr Bld 6.1 4.6 - 6.5 %  Lipid panel  Result Value Ref Range   Cholesterol 135 0 - 200 mg/dL   Triglycerides 55.0 0.0 - 149.0 mg/dL   HDL 62.50 >39.00 mg/dL   VLDL 11.0 0.0 - 40.0 mg/dL   LDL Cholesterol 61 0 - 99 mg/dL   Total CHOL/HDL Ratio 2    NonHDL 72.21   VITAMIN D 25 Hydroxy  (Vit-D Deficiency, Fractures)  Result Value Ref Range   VITD 17.76 (L) 30.00 - 100.00 ng/mL  TSH  Result Value Ref Range   TSH 1.13 0.35 - 5.50 uIU/mL

## 2021-11-19 NOTE — Patient Instructions (Addendum)
It was good to see you again today, assuming all is well please see me in about 6 I do recommend getting vaccinated against COVID-19 if not done already  I will be in touch with your labs We will get you started on Saxneda for weight loss- 0.6 mg daily for one week Then 1.2 mg daily for one week Then 1.8 mg daily for one week Then 2.4 mg daily for one week Then goal  dose of 3 mg Ok to stick at a lower dose if sufficient appetite control!

## 2021-11-25 ENCOUNTER — Ambulatory Visit (INDEPENDENT_AMBULATORY_CARE_PROVIDER_SITE_OTHER): Payer: BC Managed Care – PPO | Admitting: Family Medicine

## 2021-11-25 ENCOUNTER — Encounter: Payer: Self-pay | Admitting: Family Medicine

## 2021-11-25 VITALS — BP 122/72 | HR 72 | Temp 97.9°F | Resp 18 | Ht 63.0 in | Wt 265.4 lb

## 2021-11-25 DIAGNOSIS — Z1329 Encounter for screening for other suspected endocrine disorder: Secondary | ICD-10-CM | POA: Diagnosis not present

## 2021-11-25 DIAGNOSIS — R5383 Other fatigue: Secondary | ICD-10-CM

## 2021-11-25 DIAGNOSIS — F411 Generalized anxiety disorder: Secondary | ICD-10-CM

## 2021-11-25 DIAGNOSIS — Z6841 Body Mass Index (BMI) 40.0 and over, adult: Secondary | ICD-10-CM

## 2021-11-25 DIAGNOSIS — I10 Essential (primary) hypertension: Secondary | ICD-10-CM | POA: Diagnosis not present

## 2021-11-25 DIAGNOSIS — Z1231 Encounter for screening mammogram for malignant neoplasm of breast: Secondary | ICD-10-CM

## 2021-11-25 DIAGNOSIS — E559 Vitamin D deficiency, unspecified: Secondary | ICD-10-CM | POA: Diagnosis not present

## 2021-11-25 DIAGNOSIS — F32A Depression, unspecified: Secondary | ICD-10-CM

## 2021-11-25 DIAGNOSIS — Z23 Encounter for immunization: Secondary | ICD-10-CM

## 2021-11-25 DIAGNOSIS — Z13220 Encounter for screening for lipoid disorders: Secondary | ICD-10-CM

## 2021-11-25 DIAGNOSIS — R7303 Prediabetes: Secondary | ICD-10-CM

## 2021-11-25 LAB — VITAMIN D 25 HYDROXY (VIT D DEFICIENCY, FRACTURES): VITD: 17.76 ng/mL — ABNORMAL LOW (ref 30.00–100.00)

## 2021-11-25 LAB — LIPID PANEL
Cholesterol: 135 mg/dL (ref 0–200)
HDL: 62.5 mg/dL (ref >39.00)
LDL Cholesterol: 61 mg/dL (ref 0–99)
NonHDL: 72.21
Total CHOL/HDL Ratio: 2
Triglycerides: 55 mg/dL (ref 0.0–149.0)
VLDL: 11 mg/dL (ref 0.0–40.0)

## 2021-11-25 LAB — BASIC METABOLIC PANEL
BUN: 22 mg/dL (ref 6–23)
CO2: 26 mEq/L (ref 19–32)
Calcium: 9.2 mg/dL (ref 8.4–10.5)
Chloride: 105 mEq/L (ref 96–112)
Creatinine, Ser: 0.87 mg/dL (ref 0.40–1.20)
GFR: 74.65 mL/min (ref >60.00)
Glucose, Bld: 88 mg/dL (ref 70–99)
Potassium: 4.8 mEq/L (ref 3.5–5.1)
Sodium: 140 mEq/L (ref 135–145)

## 2021-11-25 LAB — CBC
HCT: 38.5 % (ref 36.0–46.0)
Hemoglobin: 12.7 g/dL (ref 12.0–15.0)
MCHC: 32.9 g/dL (ref 30.0–36.0)
MCV: 90 fl (ref 78.0–100.0)
Platelets: 208 10*3/uL (ref 150.0–400.0)
RBC: 4.28 Mil/uL (ref 3.87–5.11)
RDW: 14.2 % (ref 11.5–15.5)
WBC: 6.3 10*3/uL (ref 4.0–10.5)

## 2021-11-25 LAB — HEMOGLOBIN A1C: Hgb A1c MFr Bld: 6.1 % (ref 4.6–6.5)

## 2021-11-25 LAB — TSH: TSH: 1.13 u[IU]/mL (ref 0.35–5.50)

## 2021-11-25 MED ORDER — SAXENDA 18 MG/3ML ~~LOC~~ SOPN: 0.6000 mg | PEN_INJECTOR | Freq: Every day | SUBCUTANEOUS | 3 refills | Status: DC

## 2021-11-25 MED ORDER — PEN NEEDLES 32G X 4 MM MISC: 3 refills | Status: DC

## 2021-11-25 MED ORDER — VITAMIN D3 1.25 MG (50000 UT) PO CAPS: ORAL_CAPSULE | ORAL | 0 refills | Status: DC

## 2021-11-25 NOTE — Addendum Note (Signed)
Addended by: Abbe Amsterdam C on: 11/25/2021 07:55 PM   Modules accepted: Orders

## 2021-11-26 ENCOUNTER — Encounter: Payer: Self-pay | Admitting: Family Medicine

## 2021-11-27 ENCOUNTER — Encounter: Payer: Self-pay | Admitting: Family Medicine

## 2021-12-06 ENCOUNTER — Encounter: Payer: Self-pay | Admitting: Family Medicine

## 2021-12-06 ENCOUNTER — Telehealth: Payer: Self-pay

## 2021-12-06 NOTE — Telephone Encounter (Signed)
Ann Walter (Key: [id number]) Saxenda 18MG pen-injectors   Form Ronny Bacon PA Form [id number] NCPDP)

## 2021-12-09 NOTE — Telephone Encounter (Signed)
Your prior authorization for Saxenda has been approved! MORE INFO For eligible patients, copay assistance may be available. To learn more and be redirected to the Saxenda website, click on the "More Info" button to the right. Please also note that you may need to schedule a follow-up visit with your patient prior to the expiration of this prior authorization, as updated patient weight may be required for reauthorization.  Message from plan: Your PA request has been approved. Additional information will be provided in the approval communication. (Message 1145) 

## 2021-12-18 ENCOUNTER — Encounter: Payer: Self-pay | Admitting: *Deleted

## 2021-12-20 ENCOUNTER — Encounter: Payer: Self-pay | Admitting: Family Medicine

## 2021-12-20 DIAGNOSIS — G4733 Obstructive sleep apnea (adult) (pediatric): Secondary | ICD-10-CM

## 2021-12-20 NOTE — Patient Instructions (Addendum)
Good to see you again today !  I will be in touch with your Pap report Please call and schedule your mammogram at your earliest convenience  Address: [address], [place], [place] [address] Phone: [phone]  We are going to treat you for bronchitis today with doxycycline antibiotic.  Please let me know if you are not feeling better in the next few days

## 2021-12-20 NOTE — Progress Notes (Signed)
[place] at [facility] [address], [place], [place] [address] [phone]  Date:  12/23/2021   Name:  Ann Walter   DOB:  1966-01-18   MRN:  [id number]  PCP:  Darreld Mclean, MD    Chief Complaint: possible bronchitis (Pt says she is coughing up some stuff, but better than it was. /Concerns/ questions: refill on BP meds)   History of Present Illness:  Ann Walter is a 56 y.o. very pleasant female patient who presents with the following:  Pt seen today with possible bronchitis Last seen by myself in January - History of prediabetes, hypertension, chronic anxiety/depression/dysthymia/irritability and anger, often related to her job We started her on saxenda at last visit - so far so good  She has not noted a lot of SE except for mild nausea  She has lost several pounds already and is quite pleased  Pap is over due- last done in 2016, normal, negative HPV  Mammo 1/21-ordered today  She got sick about one week ago Started with cough Her husband also had a cold recently No fever noted  She noted ST- cough can be productive but this is now better She had some vomiting but this is now resolved No vomiting No diarrhea   She notes she feels better now than she did last week.  She missed work all week last week and would like a note She has tested twice for covid and been negative   Wt Readings from Last 3 Encounters:  12/23/21 258 lb 9.6 oz (117.3 kg)  11/25/21 265 lb 6.4 oz (120.4 kg)  05/10/21 255 lb 3.2 oz (115.8 kg)     Patient Active Problem List   Diagnosis Date Noted   History of meniscal tear    Left shoulder pain 12/22/2017   Upper airway cough syndrome 09/14/2017   Pre-diabetes 11/22/2015   Morbid obesity due to excess calories ([place]) 11/21/2015   Ovarian cyst 02/28/2015   Chest pain, atypical 03/08/2014   Heart murmur 03/08/2014   Heart palpitations 03/08/2014   GAD (generalized anxiety disorder)  02/13/2014   Depression    Essential hypertension     Past Medical History:  Diagnosis Date   Allergy    Benign hematuria 12/2009   WORKUP WITH DR. Janice Norrie WAS NEGATIVE   Depression    Family history of adverse reaction to anesthesia    My dad -  slow awaking   Headache    Mirgraine  - years ago   Heart murmur    HTN (hypertension)    Obesity    Pre-diabetes    Ringing in ears, bilateral    Sleep apnea    UTI (urinary tract infection), uncomplicated     Past Surgical History:  Procedure Laterality Date   CHOLECYSTECTOMY  10/2010   CYSTECTOMY     x 2 from chest   KNEE ARTHROSCOPY Right 12/19/2020   Procedure: RIGHT KNEE ARTHROSCOPY WITH DEBRIDEMENT;  Surgeon: Newt Minion, MD;  Location: [facility];  Service: Orthopedics;  Laterality: Right;    Social History   Tobacco Use   Smoking status: Never   Smokeless tobacco: Never  Vaping Use   Vaping Use: Never used  Substance Use Topics   Alcohol use: Not Currently    Alcohol/week: 0.0 standard drinks    Comment: occasionally   Drug use: No    Family History  Problem Relation Age of Onset   Diabetes  Mother    Heart disease Mother        HAD TRIPLE BYPASS   Hypertension Mother    Kidney disease Father    Breast cancer Sister    Breast cancer Maternal Grandmother    Cancer Maternal Grandmother        LYMPH NODE CANCER   Cancer Maternal Aunt        COLORECTAL    COPD Brother    Kidney failure Brother    Colon cancer Neg Hx     Allergies  Allergen Reactions   Wellbutrin [Bupropion] Swelling    Throat swelling    Sulfonamide Derivatives Other (See Comments)    Unknown per pt, it's been a long time    Medication list has been reviewed and updated.  Current Outpatient Medications on File Prior to Visit  Medication Sig Dispense Refill   ALPRAZolam (XANAX) 0.5 MG tablet Take 1 tablet (0.5 mg total) by mouth 3 (three) times daily as needed. 90 tablet 0   Cholecalciferol (VITAMIN D3) 1.25 MG (50000 UT) CAPS Take  1 weekly for 12 weeks 12 capsule 0   desvenlafaxine (PRISTIQ) 100 MG 24 hr tablet Take 1 tablet by mouth once daily 90 tablet 1   famotidine (PEPCID) 20 MG tablet Take 20 mg by mouth daily as needed for heartburn or indigestion.     Insulin Pen Needle (PEN NEEDLES) 32G X 4 MM MISC Use one needle daily for injection 100 each 3   Liraglutide -Weight Management (SAXENDA) 18 MG/3ML SOPN Inject 0.6 mg into the skin daily. Increase dose weekly as directed to goal dose of 3 mg 15 mL 3   metFORMIN (GLUCOPHAGE) 500 MG tablet Take 1 tablet (500 mg total) by mouth daily. 90 tablet 3   No current facility-administered medications on file prior to visit.    Review of Systems:  As per HPI- otherwise negative.   Physical Examination: Vitals:   12/23/21 1333  BP: 122/78  Pulse: 76  Resp: 18  Temp: 97.8 F (36.6 C)  SpO2: 98%   Vitals:   12/23/21 1333  Weight: 258 lb 9.6 oz (117.3 kg)  Height: 5\' 3"  (1.6 m)   Body mass index is 45.81 kg/m. Ideal Body Weight: Weight in (lb) to have BMI = 25: 140.8  GEN: no acute distress.  Obese, looks well HEENT: Atraumatic, Normocephalic. Bilateral TM wnl, oropharynx normal.  PEERL,EOMI.   Ears and Nose: No external deformity. CV: RRR, No M/G/R. No JVD. No thrill. No extra heart sounds. PULM: CTA B, no wheezes, crackles, rhonchi. No retractions. No resp. distress. No accessory muscle use. EXTR: No c/c/e PSYCH: Normally interactive. Conversant.  Pap elected today.  Normal vulva, vagina, cervix  Assessment and Plan: Acute bronchitis, unspecified organism - Plan: doxycycline (VIBRAMYCIN) 100 MG capsule  Screening for cervical cancer - Plan: Cytology - PAP  Screening mammogram for breast cancer - Plan: MM 3D SCREEN BREAST BILATERAL  Essential hypertension - Plan: telmisartan (MICARDIS) 80 MG tablet  Class 3 severe obesity with serious comorbidity and body mass index (BMI) of 45.0 to 49.9 in adult, unspecified obesity type ([place])  Seen today with  concern of bronchitis.  Patient has been sick now for a week.  We will treat with doxycycline  Ordered mammogram, completed Pap  Blood pressure under good control, continue current medication  She is using Saxenda for obesity with good results so far!   Signed Lamar Blinks, MD

## 2021-12-23 ENCOUNTER — Other Ambulatory Visit (HOSPITAL_COMMUNITY)
Admission: RE | Admit: 2021-12-23 | Discharge: 2021-12-23 | Disposition: A | Discharge status: Home / Self Care | Payer: BC Managed Care – PPO | Source: Ambulatory Visit | Attending: Family Medicine | Admitting: Family Medicine

## 2021-12-23 ENCOUNTER — Ambulatory Visit: Payer: BC Managed Care – PPO | Admitting: Family Medicine

## 2021-12-23 VITALS — BP 122/78 | HR 76 | Temp 97.8°F | Resp 18 | Ht 63.0 in | Wt 258.6 lb

## 2021-12-23 DIAGNOSIS — Z6841 Body Mass Index (BMI) 40.0 and over, adult: Secondary | ICD-10-CM

## 2021-12-23 DIAGNOSIS — Z124 Encounter for screening for malignant neoplasm of cervix: Secondary | ICD-10-CM | POA: Insufficient documentation

## 2021-12-23 DIAGNOSIS — J209 Acute bronchitis, unspecified: Secondary | ICD-10-CM | POA: Diagnosis not present

## 2021-12-23 DIAGNOSIS — I10 Essential (primary) hypertension: Secondary | ICD-10-CM

## 2021-12-23 DIAGNOSIS — Z1231 Encounter for screening mammogram for malignant neoplasm of breast: Secondary | ICD-10-CM | POA: Diagnosis not present

## 2021-12-23 MED ORDER — DOXYCYCLINE HYCLATE 100 MG PO CAPS: 100.0000 mg | ORAL_CAPSULE | Freq: Two times a day (BID) | ORAL | 0 refills | Status: DC

## 2021-12-23 MED ORDER — TELMISARTAN 80 MG PO TABS: 80.0000 mg | ORAL_TABLET | Freq: Every day | ORAL | 3 refills | Status: DC

## 2021-12-25 ENCOUNTER — Encounter: Payer: Self-pay | Admitting: Family Medicine

## 2021-12-25 LAB — CYTOLOGY - PAP
Comment: NEGATIVE
Diagnosis: NEGATIVE
Diagnosis: REACTIVE
High risk HPV: NEGATIVE

## 2022-01-06 ENCOUNTER — Ambulatory Visit
Admission: RE | Admit: 2022-01-06 | Discharge: 2022-01-06 | Disposition: A | Discharge status: Home / Self Care | Payer: BC Managed Care – PPO | Source: Ambulatory Visit | Attending: Family Medicine | Admitting: Family Medicine

## 2022-01-06 DIAGNOSIS — Z1231 Encounter for screening mammogram for malignant neoplasm of breast: Secondary | ICD-10-CM | POA: Diagnosis not present

## 2022-01-09 ENCOUNTER — Other Ambulatory Visit: Payer: Self-pay | Admitting: Internal Medicine

## 2022-01-27 ENCOUNTER — Institutional Professional Consult (permissible substitution): Payer: BC Managed Care – PPO | Admitting: Primary Care

## 2022-01-31 ENCOUNTER — Other Ambulatory Visit: Payer: Self-pay | Admitting: Family Medicine

## 2022-01-31 DIAGNOSIS — F411 Generalized anxiety disorder: Secondary | ICD-10-CM

## 2022-02-03 ENCOUNTER — Ambulatory Visit (INDEPENDENT_AMBULATORY_CARE_PROVIDER_SITE_OTHER): Payer: BC Managed Care – PPO | Admitting: Primary Care

## 2022-02-03 ENCOUNTER — Encounter: Payer: Self-pay | Admitting: Primary Care

## 2022-02-03 VITALS — BP 126/74 | HR 75 | Temp 97.9°F | Ht 63.0 in | Wt 256.2 lb

## 2022-02-03 DIAGNOSIS — G4733 Obstructive sleep apnea (adult) (pediatric): Secondary | ICD-10-CM

## 2022-02-03 DIAGNOSIS — Z9989 Dependence on other enabling machines and devices: Secondary | ICD-10-CM

## 2022-02-03 NOTE — Progress Notes (Signed)
? ?@Patient  ID: , female    DOB: [birth year], 56 y.o.   MRN: [id number] ? ?Chief Complaint  ?Patient presents with  ? Consult  ?  Sleep consult. Pt states she has a current cpap but is unsure who supplies her machine. She is here for supplies. She states that she wears her cpap everynight. She had a sleep study done 3-4 years ago through [facility].   ? ? ?Referring provider: ?[place], [facility], MD ? ?HPI: ?56 year old female, never smoked. PMH significant for HTN, OSA, GAD, UACS, pre-diabetes, morbid obesity.  ? ?02/03/2022 ?Patient presents today for sleep consult.  She has hx sleep apnea. She had a split-night sleep study in 2019 that showed severe sleep apnea, AHI 59/hr. Optimal pressure was 12cm h20 with small FFM with heated humidity. She is currently wearing CPAP and needs new supplies.  She is unsure of her DME company, previously [employer] which is now [employer]. Her typical bedtime is between 8:30-9:30pm. It does not take her long to fall asleep. She normally sleeps well through the night, waking up only 1-2 times to use restroom. Starts her day between 5:30-6:30am. She has lost 15 lbs in the last 2 years. She does not wear oxygen. No significant daytime sleepiness. Denies narcolepsy, cataplexy, sleep walking.  [place] 11.  ? ? ?Allergies  ?Allergen Reactions  ? Wellbutrin [Bupropion] Swelling  ?  Throat swelling ?  ? Sulfonamide Derivatives Other (See Comments)  ?  Unknown per pt, it's been a long time  ? ? ?Immunization History  ?Administered Date(s) Administered  ? Influenza Split 07/08/2012  ? Influenza,inj,Quad PF,6+ Mos 11/27/2014, 07/03/2015, 07/09/2016, 06/24/2017, 07/07/2018, 09/01/2019, 08/09/2020, 11/25/2021  ? Tdap 05/16/2014  ? ? ?Past Medical History:  ?Diagnosis Date  ? Allergy   ? Benign hematuria 12/2009  ? WORKUP WITH DR. 01/2010 WAS NEGATIVE  ? Depression   ? Family history of adverse reaction to anesthesia   ? My dad -  slow awaking  ? Headache   ? Mirgraine  - years ago  ? Heart  murmur   ? HTN (hypertension)   ? Obesity   ? Pre-diabetes   ? Ringing in ears, bilateral   ? Sleep apnea   ? UTI (urinary tract infection), uncomplicated   ? ? ?Tobacco History: ?Social History  ? ?Tobacco Use  ?Smoking Status Never  ?Smokeless Tobacco Never  ? ?Counseling given: Not Answered ? ? ?Outpatient Medications Prior to Visit  ?Medication Sig Dispense Refill  ? ALPRAZolam (XANAX) 0.5 MG tablet Take 1 tablet (0.5 mg total) by mouth 3 (three) times daily as needed. 90 tablet 0  ? desvenlafaxine (PRISTIQ) 100 MG 24 hr tablet Take 1 tablet by mouth once daily 90 tablet 1  ? doxycycline (VIBRAMYCIN) 100 MG capsule Take 1 capsule (100 mg total) by mouth 2 (two) times daily. 20 capsule 0  ? famotidine (PEPCID) 20 MG tablet Take 20 mg by mouth daily as needed for heartburn or indigestion.    ? Insulin Pen Needle (PEN NEEDLES) 32G X 4 MM MISC Use one needle daily for injection 100 each 3  ? Liraglutide -Weight Management (SAXENDA) 18 MG/3ML SOPN Inject 0.6 mg into the skin daily. Increase dose weekly as directed to goal dose of 3 mg 15 mL 3  ? metFORMIN (GLUCOPHAGE) 500 MG tablet Take 1 tablet (500 mg total) by mouth daily. 90 tablet 3  ? telmisartan (MICARDIS) 80 MG tablet Take 1 tablet (80 mg total) by mouth daily.  90 tablet 3  ? Cholecalciferol (VITAMIN D3) 1.25 MG (50000 UT) CAPS Take 1 weekly for 12 weeks 12 capsule 0  ? ?No facility-administered medications prior to visit.  ? ? ?Review of Systems ? ?Review of Systems  ?Constitutional: Negative.   ?HENT: Negative.    ?Respiratory: Negative.    ?Cardiovascular: Negative.   ? ? ?Physical Exam ? ?BP 126/74 (BP Location: Left Arm, Patient Position: Sitting, Cuff Size: Normal)   Pulse 75   Temp 97.9 ?F (36.6 ?C) (Oral)   Ht 5\' 3"  (1.6 m)   Wt 256 lb 3.2 oz (116.2 kg)   LMP  (LMP Unknown) Comment: pt states she is not have periods  SpO2 96%   BMI 45.38 kg/m?  ?Physical Exam ?Constitutional:   ?   General: She is not in acute distress. ?   Appearance: Normal  appearance. She is not ill-appearing.  ?HENT:  ?   Head: Normocephalic and atraumatic.  ?   Mouth/Throat:  ?   Mouth: Mucous membranes are moist.  ?   Pharynx: Oropharynx is clear.  ?Cardiovascular:  ?   Rate and Rhythm: Normal rate and regular rhythm.  ?Pulmonary:  ?   Effort: Pulmonary effort is normal.  ?   Breath sounds: Normal breath sounds.  ?Musculoskeletal:     ?   General: Normal range of motion.  ?Skin: ?   General: Skin is warm and dry.  ?Neurological:  ?   General: No focal deficit present.  ?   Mental Status: She is alert and oriented to person, place, and time. Mental status is at baseline.  ?Psychiatric:     ?   Mood and Affect: Mood normal.     ?   Behavior: Behavior normal.     ?   Thought Content: Thought content normal.     ?   Judgment: Judgment normal.  ?  ? ?Lab Results: ? ?CBC ?   ?Component Value Date/Time  ? WBC 6.3 11/25/2021 1024  ? RBC 4.28 11/25/2021 1024  ? HGB 12.7 11/25/2021 1024  ? HCT 38.5 11/25/2021 1024  ? PLT 208.0 11/25/2021 1024  ? MCV 90.0 11/25/2021 1024  ? MCV 95.8 04/13/2013 1011  ? MCH 30.3 08/09/2020 1138  ? MCHC 32.9 11/25/2021 1024  ? RDW 14.2 11/25/2021 1024  ? LYMPHSABS 2.3 11/29/2016 2133  ? MONOABS 0.5 11/29/2016 2133  ? EOSABS 0.4 11/29/2016 2133  ? BASOSABS 0.0 11/29/2016 2133  ? ? ?BMET ?   ?Component Value Date/Time  ? NA 140 11/25/2021 1024  ? K 4.8 11/25/2021 1024  ? CL 105 11/25/2021 1024  ? CO2 26 11/25/2021 1024  ? GLUCOSE 88 11/25/2021 1024  ? BUN 22 11/25/2021 1024  ? CREATININE 0.87 11/25/2021 1024  ? CREATININE 0.74 08/09/2020 1138  ? CALCIUM 9.2 11/25/2021 1024  ? GFRNONAA >60 12/19/2020 0600  ? GFRAA >60 03/25/2018 1636  ? ? ?BNP ?No results found for: BNP ? ?ProBNP ?No results found for: PROBNP ? ?Imaging: ?MM 3D SCREEN BREAST BILATERAL ? ?Result Date: 01/06/2022 ?CLINICAL DATA:  Screening. EXAM: DIGITAL SCREENING BILATERAL MAMMOGRAM WITH TOMOSYNTHESIS AND CAD TECHNIQUE: Bilateral screening digital craniocaudal and mediolateral oblique mammograms were  obtained. Bilateral screening digital breast tomosynthesis was performed. The images were evaluated with computer-aided detection. COMPARISON:  Previous exam(s). ACR Breast Density Category b: There are scattered areas of fibroglandular density. FINDINGS: There are no findings suspicious for malignancy. IMPRESSION: No mammographic evidence of malignancy. A result letter of this screening mammogram will  be mailed directly to the patient. RECOMMENDATION: Screening mammogram in one year. (Code:SM-B-01Y) BI-RADS CATEGORY  1: Negative. Electronically Signed   By: Baird Lyons M.D.   On: 01/06/2022 16:26   ? ? ?Assessment & Plan:  ? ?OSA on CPAP ?- Split night 06/03/2018 >> severe OSA, AHI 59/hr. Optimal pressure 12cm h20. Patient reports compliance with CPAP and benefit from use. [employer] is sending compliance report, we will place an order to have CPAP supplies renewed. Continue to encourage patient wear CPAP every night. Advised against driving if experiencing excessive daytime sleepinesss. Continue to work on weight loss efforts. FU in 6 months or sooner if needed.  ? ? ?Glenford Bayley, NP ?02/03/2022 ? ?

## 2022-02-03 NOTE — Patient Instructions (Signed)
?Your DME company is now [employer] (previously [employer]  ?[employer] [phone]  ? ?Recommendations: ?- Please bring CPAP by [employer] so they can pull a download from your machine and send to [place] for compliance check. If pressure setting need to be adjusted I can make recommendations from that report.  ?- Continue wear CPAP every night for 4-6 hours. Do not drive if experiencing excessive daytime sleepiness  ?- Continue to work on weight loss efforts  ? ?Orders: ?- Please renew CPAP supplies with [employer]  ? ?Follow-up: ?- 6 months with Beth NP  ? ? ?CPAP and BIPAP Information ?CPAP and BIPAP are methods that use air pressure to keep your airways open and to help you breathe well. CPAP and BIPAP use different amounts of pressure. Your health care provider will tell you whether CPAP or BIPAP would be more helpful for you. ?CPAP stands for "continuous positive airway pressure." With CPAP, the amount of pressure stays the same while you breathe in (inhale) and out (exhale). ?BIPAP stands for "bi-level positive airway pressure." With BIPAP, the amount of pressure will be higher when you inhale and lower when you exhale. This allows you to take larger breaths. ?CPAP or BIPAP may be used in the hospital, or your health care provider may want you to use it at home. You may need to have a sleep study before your health care provider can order a machine for you to use at home. ?What are the advantages? ?CPAP or BIPAP can be helpful if you have: ?Sleep apnea. ?Chronic obstructive pulmonary disease (COPD). ?Heart failure. ?Medical conditions that cause muscle weakness, including muscular dystrophy or amyotrophic lateral sclerosis (ALS). ?Other problems that cause breathing to be shallow, weak, abnormal, or difficult. ?CPAP and BIPAP are most commonly used for obstructive sleep apnea (OSA) to keep the airways from collapsing when the muscles relax during sleep. ?What are the risks? ?Generally, this is a safe treatment. However, problems  may occur, including: ?Irritated skin or skin sores if the mask does not fit properly. ?Dry or stuffy nose or nosebleeds. ?Dry mouth. ?Feeling gassy or bloated. ?Sinus or lung infection if the equipment is not cleaned properly. ?When should CPAP or BIPAP be used? ?In most cases, the mask only needs to be worn during sleep. Generally, the mask needs to be worn throughout the night and during any daytime naps. People with certain medical conditions may also need to wear the mask at other times, such as when they are awake. Follow instructions from your health care provider about when to use the machine. ?What happens during CPAP or BIPAP? ?Both CPAP and BIPAP are provided by a small machine with a flexible plastic tube that attaches to a plastic mask that you wear. Air is blown through the mask into your nose or mouth. The amount of pressure that is used to blow the air can be adjusted on the machine. Your health care provider will set the pressure setting and help you find the best mask for you. ?Tips for using the mask ?Because the mask needs to be snug, some people feel trapped or closed-in (claustrophobic) when first using the mask. If you feel this way, you may need to get used to the mask. One way to do this is to hold the mask loosely over your nose or mouth and then gradually apply the mask more snugly. You can also gradually increase the amount of time that you use the mask. ?Masks are available in various types and sizes. If  your mask does not fit well, talk with your health care provider about getting a different one. Some common types of masks include: ?Full face masks, which fit over the mouth and nose. ?Nasal masks, which fit over the nose. ?Nasal pillow or prong masks, which fit into the nostrils. ?If you are using a mask that fits over your nose and you tend to breathe through your mouth, a chin strap may be applied to help keep your mouth closed. ?Use a skin barrier to protect your skin as told by  your health care provider. ?Some CPAP and BIPAP machines have alarms that may sound if the mask comes off or develops a leak. ?If you have trouble with the mask, it is very important that you talk with your health care provider about finding a way to make the mask easier to tolerate. Do not stop using the mask. There could be a negative impact on your health if you stop using the mask. ?Tips for using the machine ?Place your CPAP or BIPAP machine on a secure table or stand near an electrical outlet. ?Know where the on/off switch is on the machine. ?Follow instructions from your health care provider about how to set the pressure on your machine and when you should use it. ?Do not eat or drink while the CPAP or BIPAP machine is on. Food or fluids could get pushed into your lungs by the pressure of the CPAP or BIPAP. ?For home use, CPAP and BIPAP machines can be rented or purchased through home health care companies. Many different brands of machines are available. Renting a machine before purchasing may help you find out which particular machine works well for you. Your health insurance company may also decide which machine you may get. ?Keep the CPAP or BIPAP machine and attachments clean. Ask your health care provider for specific instructions. ?Check the humidifier if you have a dry stuffy nose or nosebleeds. Make sure it is working correctly. ?Follow these instructions at home: ?Take over-the-counter and prescription medicines only as told by your health care provider. Ask if you can take sinus medicine if your sinuses are blocked. ?Do not use any products that contain nicotine or tobacco. These products include cigarettes, chewing tobacco, and vaping devices, such as e-cigarettes. If you need help quitting, ask your health care provider. ?Keep all follow-up visits. This is important. ?Contact a health care provider if: ?You have redness or pressure sores on your head, face, mouth, or nose from the mask or head  gear. ?You have trouble using the CPAP or BIPAP machine. ?You cannot tolerate wearing the CPAP or BIPAP mask. ?Someone tells you that you snore even when wearing your CPAP or BIPAP. ?Get help right away if: ?You have trouble breathing. ?You feel confused. ?Summary ?CPAP and BIPAP are methods that use air pressure to keep your airways open and to help you breathe well. ?If you have trouble with the mask, it is very important that you talk with your health care provider about finding a way to make the mask easier to tolerate. Do not stop using the mask. There could be a negative impact to your health if you stop using the mask. ?Follow instructions from your health care provider about when to use the machine. ?This information is not intended to replace advice given to you by your health care provider. Make sure you discuss any questions you have with your health care provider. ?Document Revised: 05/22/2021 Document Reviewed: 09/21/2020 ?Elsevier Patient Education ? 2022  Elsevier Inc. ? ?

## 2022-02-03 NOTE — Assessment & Plan Note (Addendum)
-   Split night 06/03/2018 >> severe OSA, AHI 59/hr. Optimal pressure 12cm h20. Patient reports compliance with CPAP and benefit from use. [employer] is sending compliance report, we will place an order to have CPAP supplies renewed. Continue to encourage patient wear CPAP every night. Advised against driving if experiencing excessive daytime sleepinesss. Continue to work on weight loss efforts. FU in 6 months or sooner if needed.  ?

## 2022-02-04 IMAGING — MR MR KNEE*R* W/O CM
4 of 6 series · 18 of 40 positions shown · non-contrast
Comparison: X-ray 11/01/2020

CLINICAL DATA: Right knee pain for 2 months.  No known injury.

EXAM:
MRI OF THE RIGHT KNEE WITHOUT CONTRAST
TECHNIQUE: Multiplanar, multisequence MR imaging of the knee was performed. No
intravenous contrast was administered.

[Series 3: T2 fat-sat · axial · 4.0mm · 0.29mm/px · z∈[-39,+39]mm · 3 of 28 slices shown (1 of 2)]
[im 5/28]
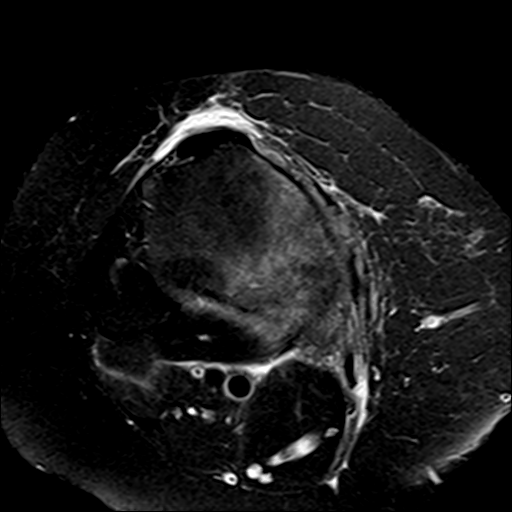
[im 14/28]
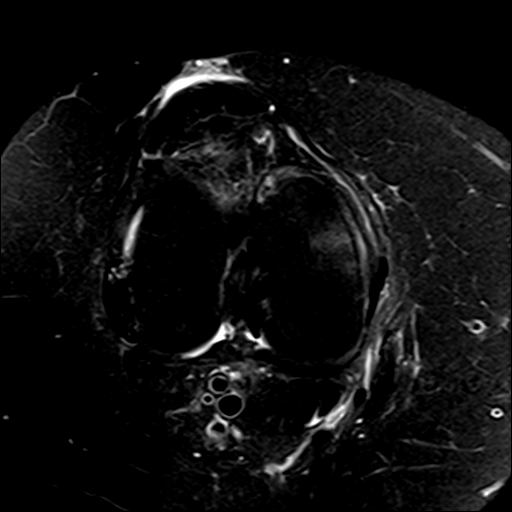
[im 23/28]
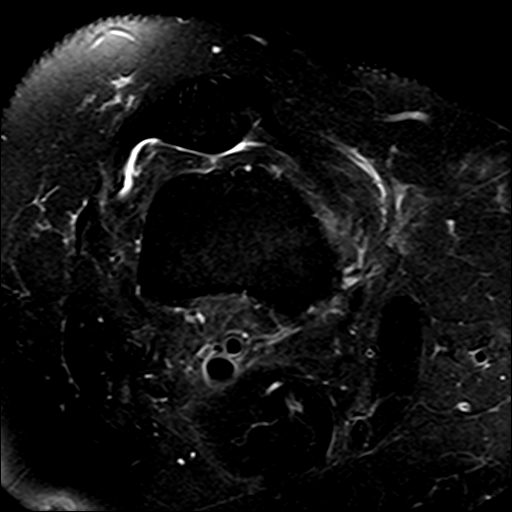

[Series 5: T2 fat-sat · coronal · 4.0mm · 0.29mm/px · 3 of 27 slices shown (2 of 2)]
[im 6/27]
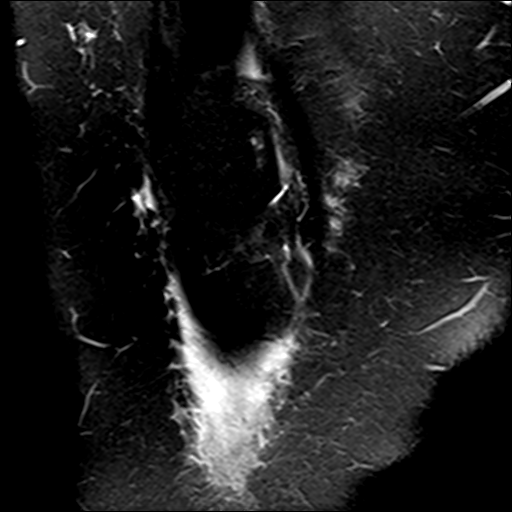
[im 16/27]
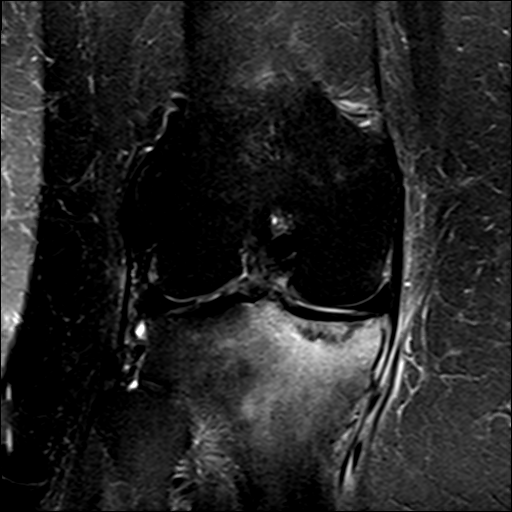
[im 27/27]
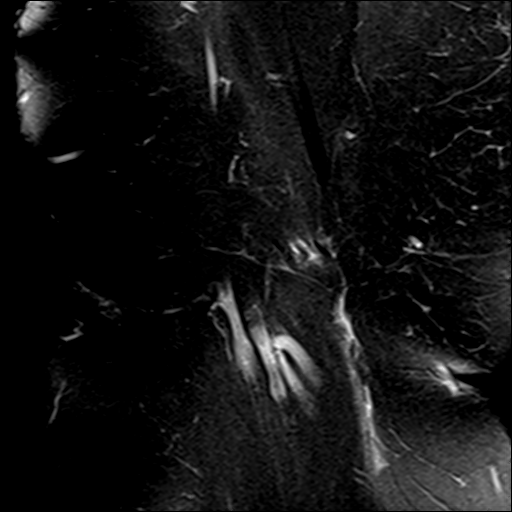

[Series 6: PD fat-sat · coronal · 3.0mm · 0.29mm/px · 7 of 28 slices shown (1 of 2)]
[im 1/28]
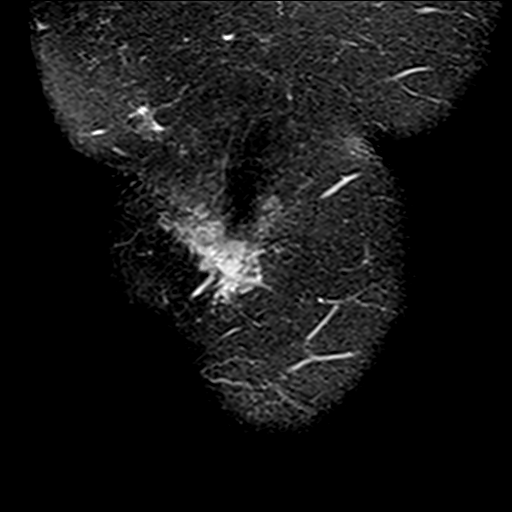
[im 5/28]
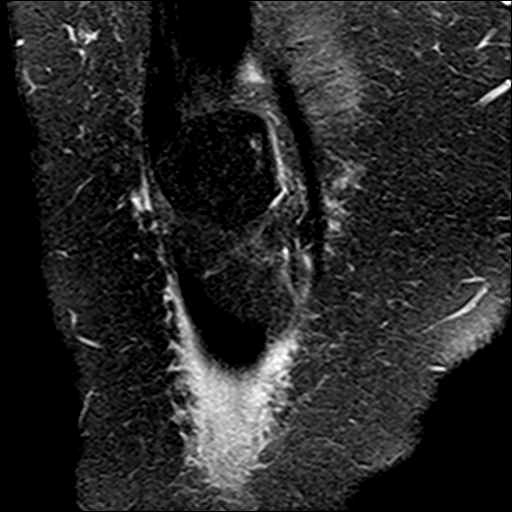
[im 10/28]
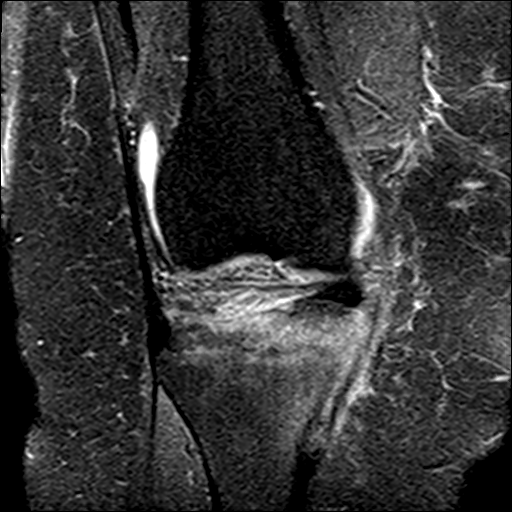
[im 14/28]
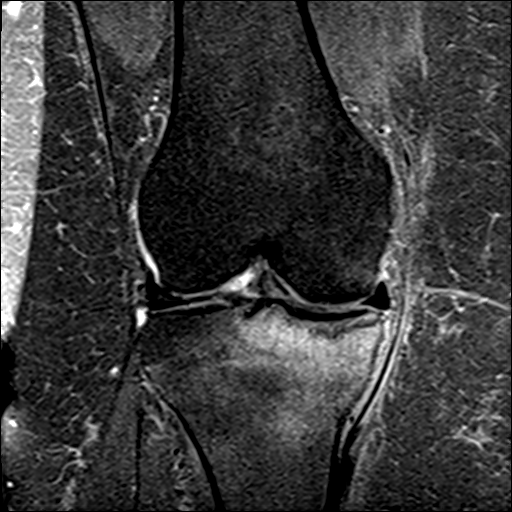
[im 19/28]
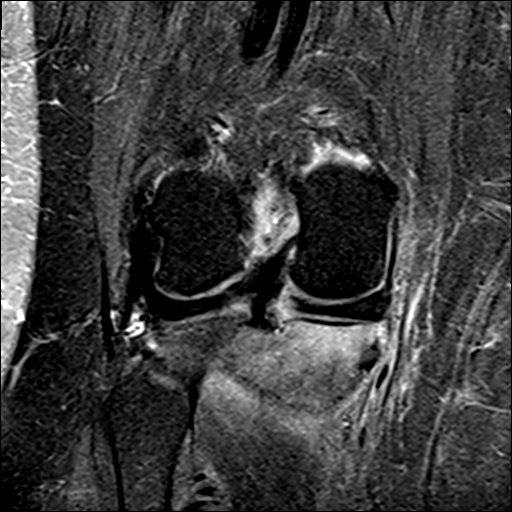
[im 23/28]
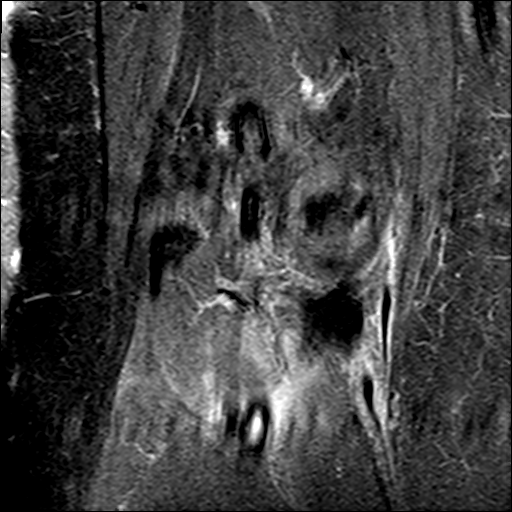
[im 28/28]
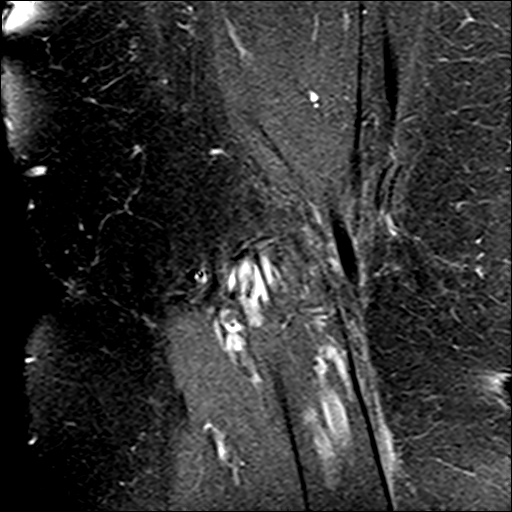

[Series 7: PD fat-sat · sagittal · 3.0mm · 0.29mm/px · 5 of 30 slices shown (2 of 2)]
[im 1/30]
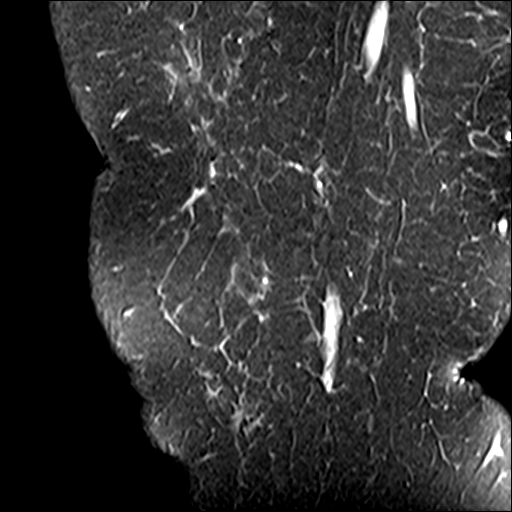
[im 5/30]
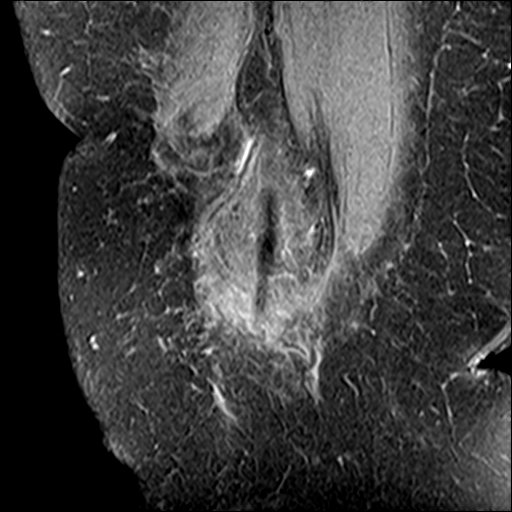
[im 10/30]
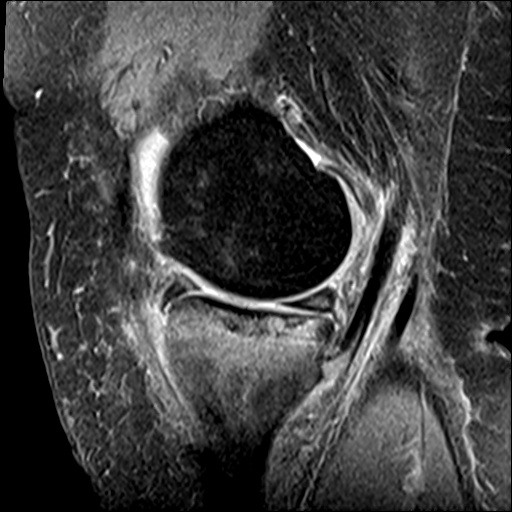
[im 15/30]
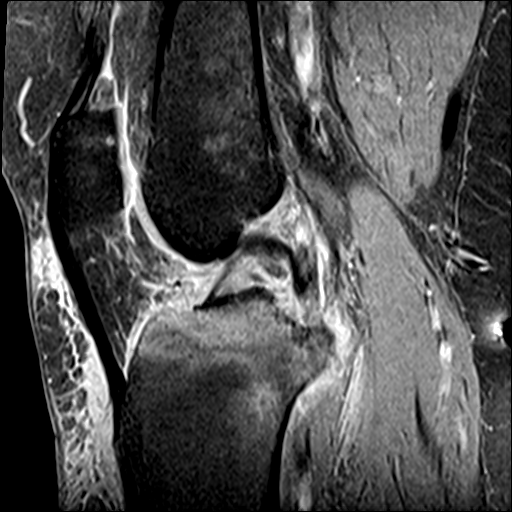
[im 25/30]
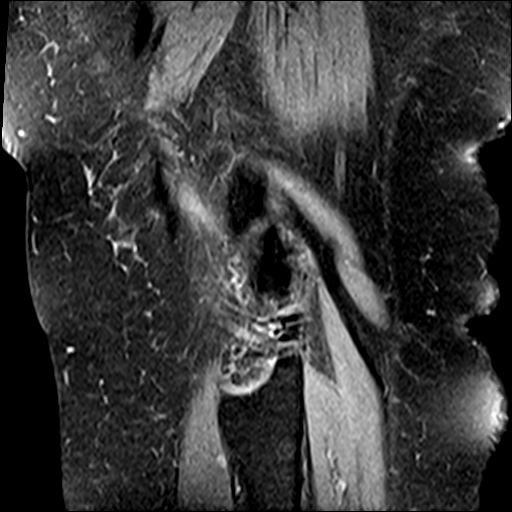

[18 of 40 positions shown; findings below may reference images not displayed]

FINDINGS: MENISCI

Medial meniscus: Complete radial tear of the posterior root
attachment of the medial meniscus (series 7, image 17; series 6,
image 10). Mild extrusion of the meniscal body. Background of
intrasubstance degeneration.

Lateral meniscus: Loss of substance of the lateral meniscal
posterior horn at the level of the menisco femoral ligament takeoff
and posterior root attachment suggesting radial tear/avulsion
(series 7, images 13-14). Suspect small oblique tear extending to
the inferior articular surface near the free edge of the midbody
(series 6, image 13). Background of intrasubstance degeneration.

LIGAMENTS

Cruciates:  Mucoid degeneration of the ACL.  Intact PCL.

Collaterals: Intact MCL with mild periligamentous edema and trace
MCL bursal fluid. Lateral collateral ligament complex intact.

CARTILAGE

Patellofemoral: Partial-thickness cartilage defects including small
chondral delamination centered at the superior portion of the
patellar apex (series 3, image 8). Mild surface irregularity of the
medial trochlea inferiorly.

Medial:  No cartilage defect.

Lateral:  No cartilage defect.

Joint:  Trace joint effusion.  Edematous appearance of Hoffa's fat.

Popliteal Fossa: Trace fluid within a Baker's cyst. Intact popliteus
tendon. Mild popliteus intramuscular edema.

Extensor Mechanism:  Intact quadriceps tendon and patellar tendon.

Bones: Acute subchondral fracture of the medial tibial plateau
involving an approximately 2.6 x 2.4 cm area with extensive
surrounding bone marrow edema. No additional fractures. Slight
lateral subluxation of the patella. No dislocation. No suspicious
bone lesion.

Other: Small-moderate volume prepatellar fluid.
IMPRESSION: 1. Acute subchondral fracture of the medial tibial plateau with
extensive surrounding bone marrow edema.
2. Complete radial tear of the posterior root attachment of the
medial meniscus.
3. Probable radial tear/avulsion of the lateral meniscal posterior
horn at the level of the meniscofemoral ligament takeoff and
posterior root attachment.
4. Mucoid degeneration of the ACL.
5. Grade 1 MCL sprain.
6. Small-moderate volume prepatellar fluid, which may reflect
bursitis.

These results will be called to the ordering clinician or
representative by the Radiologist Assistant, and communication
documented in the PACS or [REDACTED].

## 2022-02-04 NOTE — Progress Notes (Signed)
Reviewed and agree with assessment/plan. ? ? ?Burech Mcfarland, MD ?[facility] ?02/04/2022, 12:14 PM ?Pager:  [phone] ? ?

## 2022-02-13 ENCOUNTER — Encounter: Payer: Self-pay | Admitting: Family Medicine

## 2022-02-13 DIAGNOSIS — F411 Generalized anxiety disorder: Secondary | ICD-10-CM

## 2022-02-13 DIAGNOSIS — F32A Depression, unspecified: Secondary | ICD-10-CM

## 2022-02-13 MED ORDER — DESVENLAFAXINE SUCCINATE ER 100 MG PO TB24: 100.0000 mg | ORAL_TABLET | Freq: Every day | ORAL | 1 refills | Status: DC

## 2022-02-18 ENCOUNTER — Encounter: Payer: Self-pay | Admitting: Family Medicine

## 2022-02-19 ENCOUNTER — Encounter: Payer: Self-pay | Admitting: Family Medicine

## 2022-02-20 ENCOUNTER — Telehealth: Payer: Self-pay | Admitting: Primary Care

## 2022-02-20 DIAGNOSIS — J42 Unspecified chronic bronchitis: Secondary | ICD-10-CM

## 2022-02-20 NOTE — Telephone Encounter (Signed)
Staff message received: ? RE: spirometry? ?Received: Today ?Martyn Ehrich, NP  Copland, Gay Filler, MD; Jeanell Mangan, Waldemar Dickens, CMA ?Sure thing  ? ?Raquel Sarna can we order 30 min PFT re: chronic bronchitis  ? ?-Beth   ?  ?   ?Previous Messages ?  ?----- Message -----  ?From: Darreld Mclean, MD  ?Sent: 02/18/2022   5:25 PM EDT  ?To: Martyn Ehrich, NP  ?Subject: spirometry?                                    ? ?Hi Ann Walter is concerned that she might have COPD, she has noted frequent bronchitis sx.  Could you guys set up spirometry for her?  ? ?Thanks, let me know  ?Jess  ?  ? ? ? ?Attempted to call pt to get her scheduled for 86min PFT (spiro and dlco) but unable to reach. Left message for her to return call. ? ?Routing to front desk pool to help get pt scheduled. This can be next avail. ?

## 2022-02-23 IMAGING — DX DG CHEST 2V
2 series · 2 of 2 positions shown · non-contrast
Comparison: 08/10/2017.

CLINICAL DATA: History of recent COVID infection.  Chest pain.

EXAM:
CHEST - 2 VIEW

[chest pa]
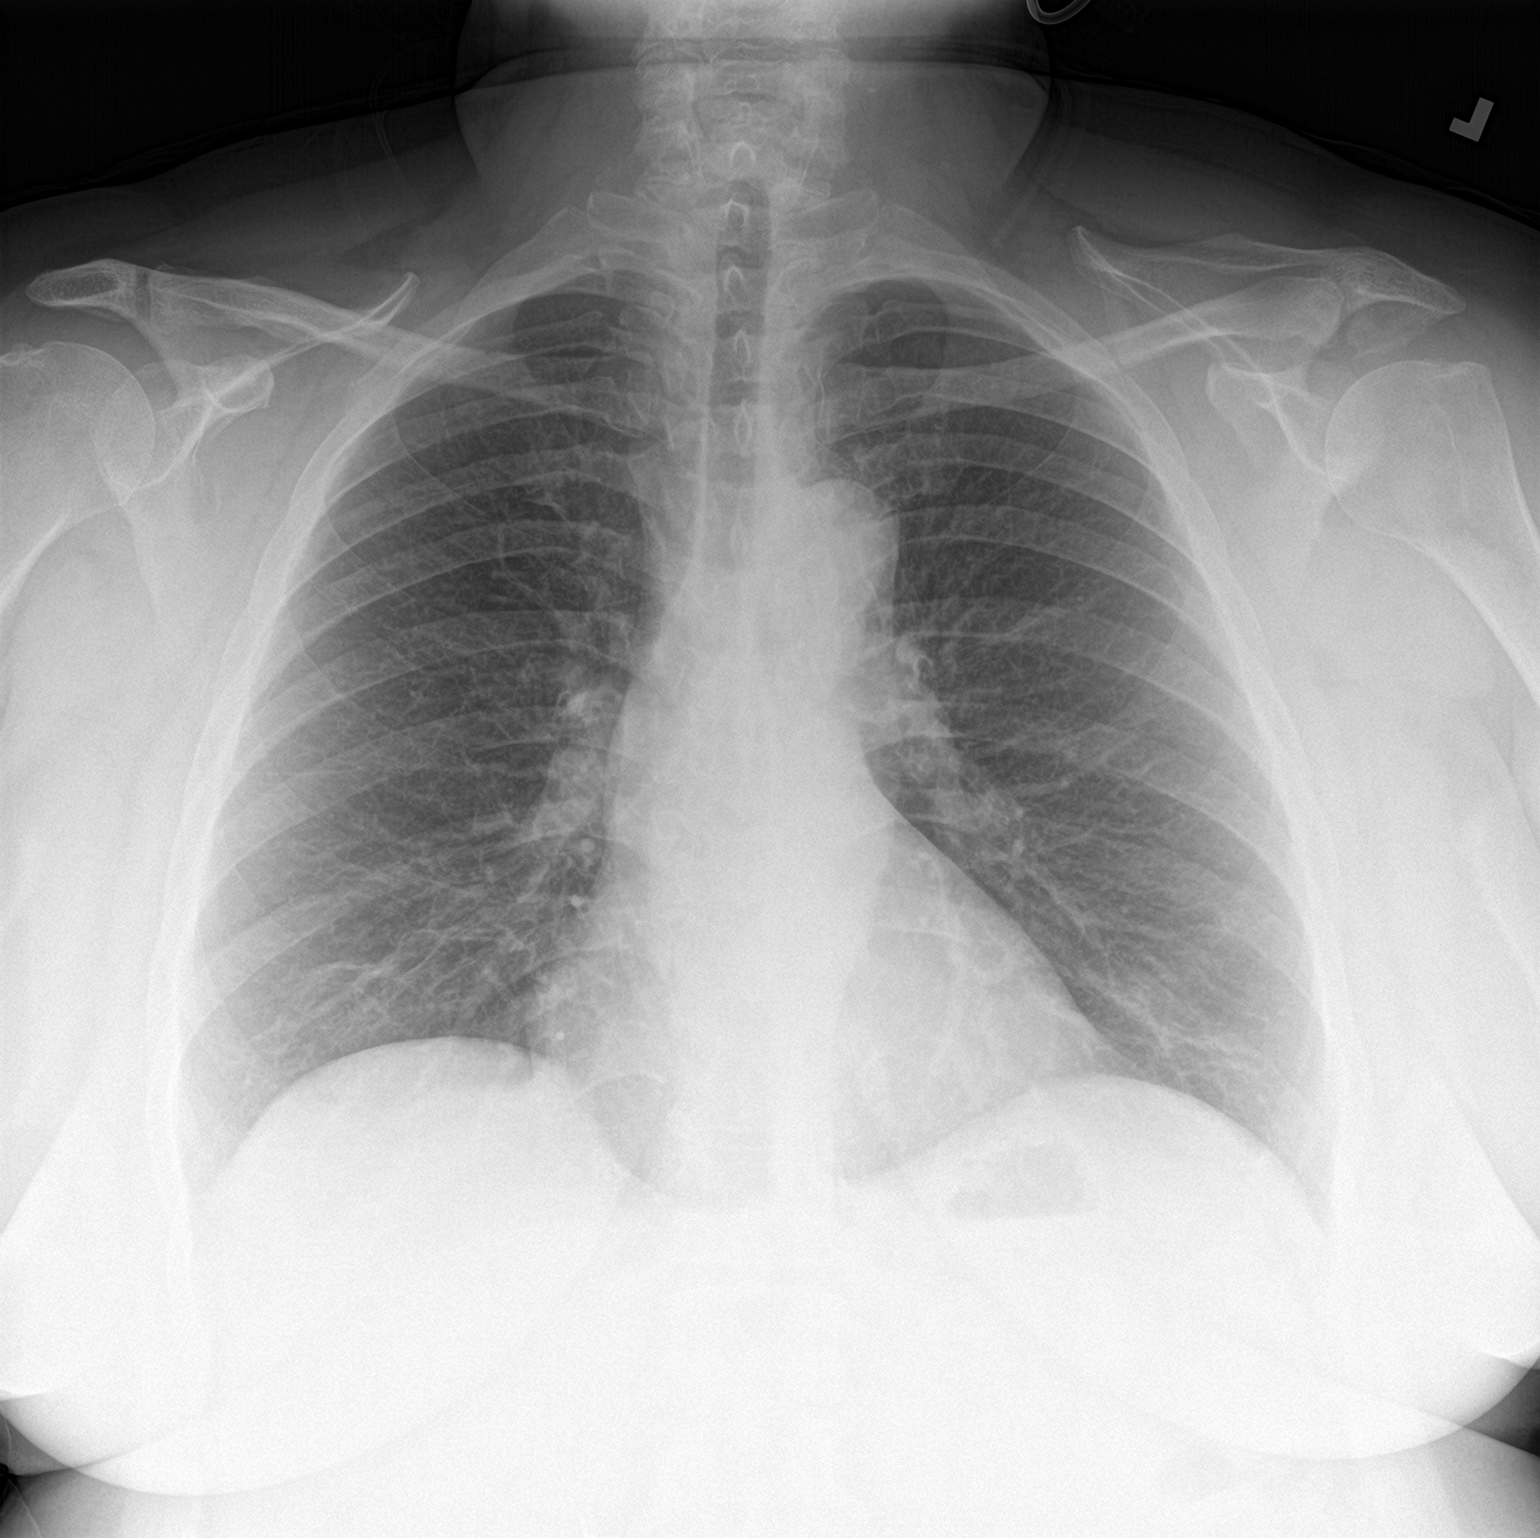

[chest lat]
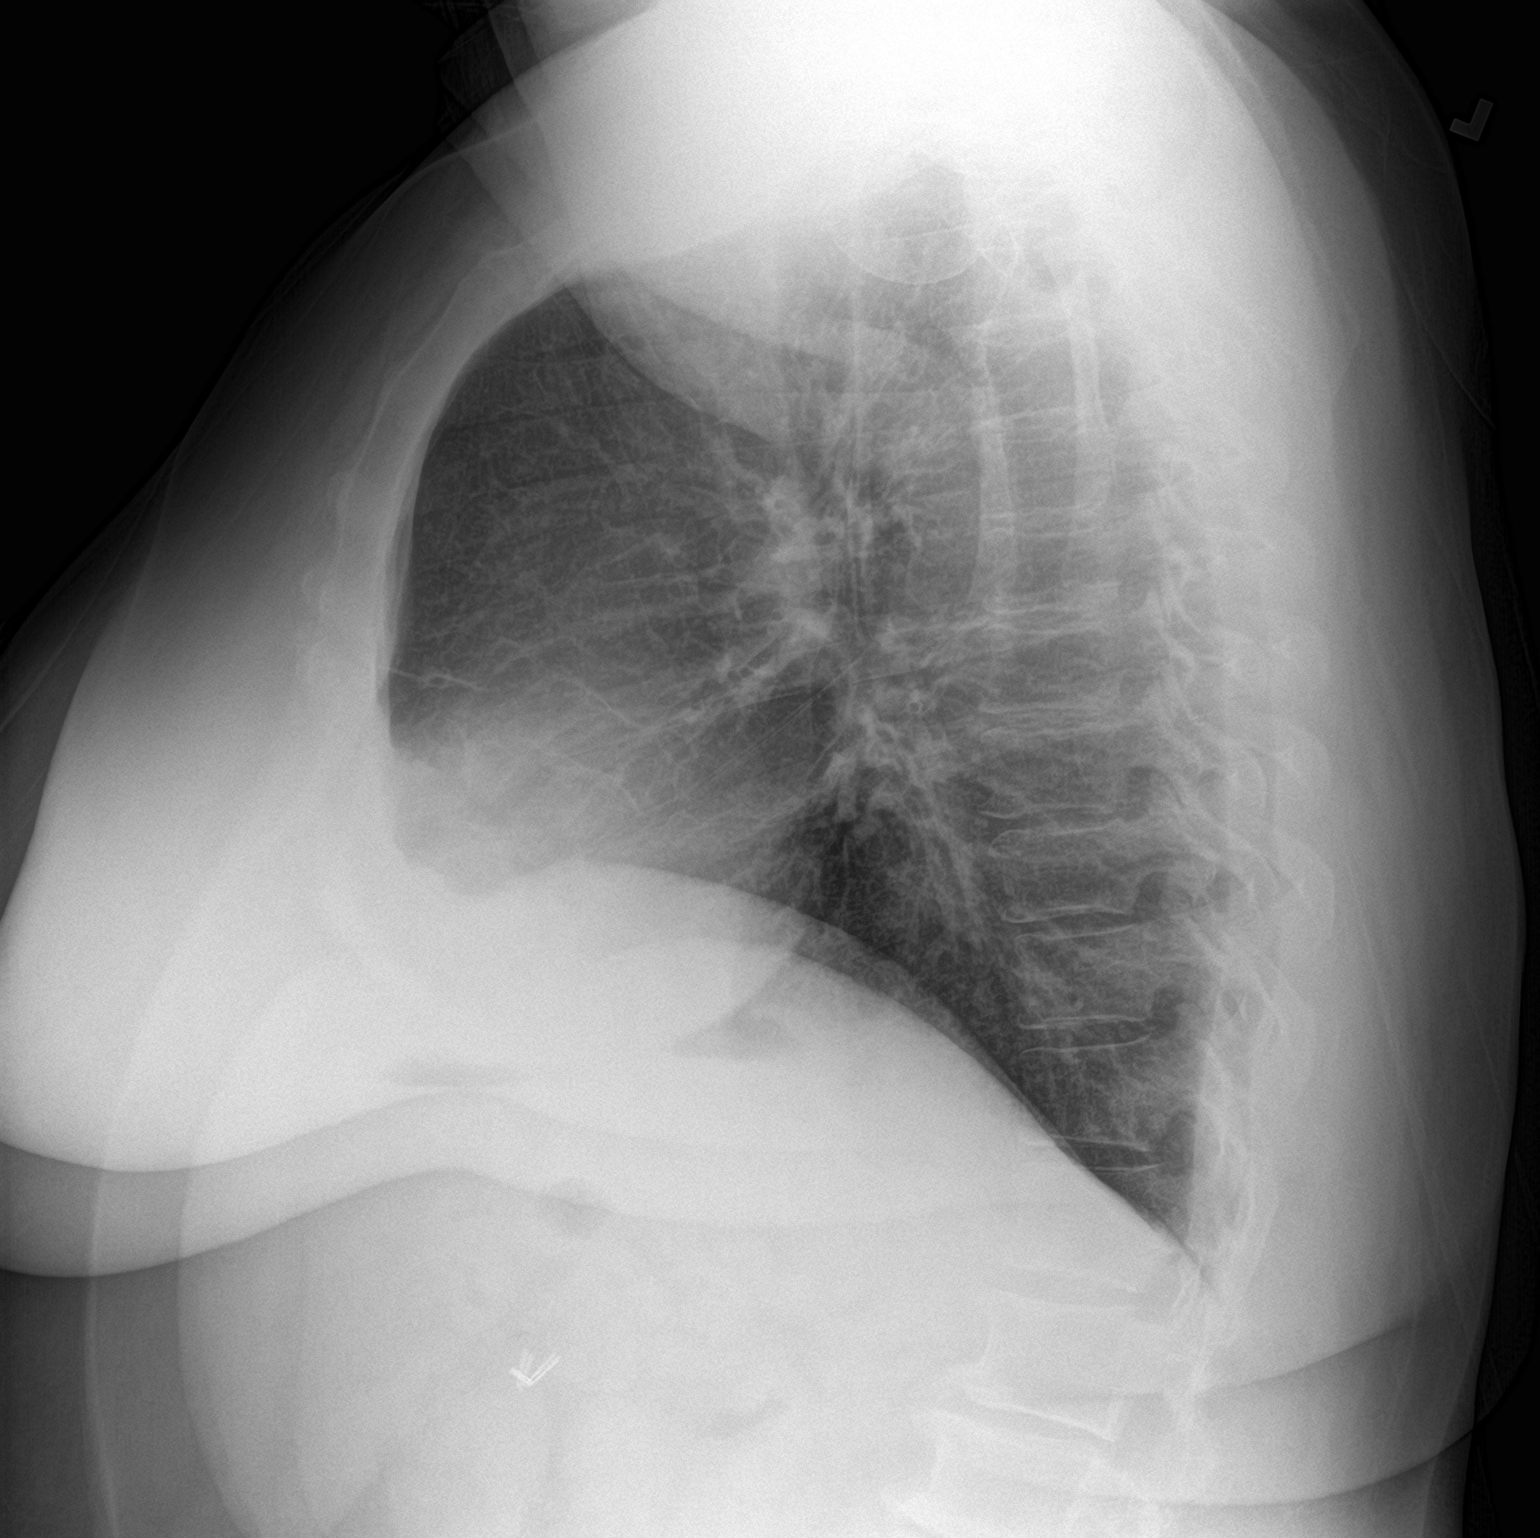

[2 of 2 positions shown; findings below may reference images not displayed]

FINDINGS: The cardiac silhouette, mediastinal and hilar contours are within
normal limits. The lungs are clear. No infiltrates, edema or
effusions. Mild eventration of both hemidiaphragms. No pulmonary
lesions. The bony thorax is intact.
IMPRESSION: No acute cardiopulmonary findings.

## 2022-02-28 NOTE — Telephone Encounter (Signed)
Left voicemail for patient to call back to schedule 30 min PFT. ?

## 2022-03-03 ENCOUNTER — Encounter: Payer: BC Managed Care – PPO | Admitting: Primary Care

## 2022-03-03 ENCOUNTER — Ambulatory Visit (INDEPENDENT_AMBULATORY_CARE_PROVIDER_SITE_OTHER): Payer: BC Managed Care – PPO | Admitting: Internal Medicine

## 2022-03-03 DIAGNOSIS — J42 Unspecified chronic bronchitis: Secondary | ICD-10-CM

## 2022-03-03 LAB — PULMONARY FUNCTION TEST
DL/VA % pred: 112 %
DL/VA: 4.8 ml/min/mmHg/L
DLCO cor % pred: 118 %
DLCO cor: 23.58 ml/min/mmHg
DLCO unc % pred: 116 %
DLCO unc: 23.05 ml/min/mmHg
FEF 25-75 Pre: 1.98 L/sec
FEF2575-%Pred-Pre: 80 %
FEV1-%Pred-Pre: 84 %
FEV1-Pre: 2.17 L
FEV1FVC-%Pred-Pre: 101 %
FEV6-%Pred-Pre: 85 %
FEV6-Pre: 2.72 L
FEV6FVC-%Pred-Pre: 103 %
FVC-%Pred-Pre: 82 %
FVC-Pre: 2.72 L
Pre FEV1/FVC ratio: 80 %
Pre FEV6/FVC Ratio: 100 %

## 2022-03-03 NOTE — Progress Notes (Deleted)
Performed Spirometry and DLCO today.  ?

## 2022-03-03 NOTE — Progress Notes (Signed)
Performed Spirometry and DLCO today.  ?

## 2022-03-03 NOTE — Telephone Encounter (Signed)
Patient scheduled for 03/03/2022 at 9:30am- nothing further needed. ?

## 2022-03-03 NOTE — Patient Instructions (Signed)
Performed Spirometry and DLCO today.  ?

## 2022-03-10 ENCOUNTER — Encounter: Payer: Self-pay | Admitting: Primary Care

## 2022-03-10 ENCOUNTER — Ambulatory Visit: Payer: BC Managed Care – PPO | Admitting: Primary Care

## 2022-03-10 ENCOUNTER — Ambulatory Visit (INDEPENDENT_AMBULATORY_CARE_PROVIDER_SITE_OTHER): Payer: BC Managed Care – PPO

## 2022-03-10 ENCOUNTER — Ambulatory Visit (INDEPENDENT_AMBULATORY_CARE_PROVIDER_SITE_OTHER): Payer: BC Managed Care – PPO | Admitting: Primary Care

## 2022-03-10 VITALS — BP 126/80 | HR 84 | Temp 98.0°F | Ht 63.5 in | Wt 259.4 lb

## 2022-03-10 DIAGNOSIS — J42 Unspecified chronic bronchitis: Secondary | ICD-10-CM

## 2022-03-10 DIAGNOSIS — G4733 Obstructive sleep apnea (adult) (pediatric): Secondary | ICD-10-CM

## 2022-03-10 DIAGNOSIS — J410 Simple chronic bronchitis: Secondary | ICD-10-CM | POA: Diagnosis not present

## 2022-03-10 DIAGNOSIS — Z9989 Dependence on other enabling machines and devices: Secondary | ICD-10-CM | POA: Diagnosis not present

## 2022-03-10 NOTE — Assessment & Plan Note (Signed)
-   Hx severe OSA. Split night sleep study 06/03/18 >> 59/hr, optimal CPAP pressure 12 cm h20. She reports compliance with CPAP use, requesting download from [employer] for review. No changes. FU in 6 months or sooner if needed.  ?

## 2022-03-10 NOTE — Patient Instructions (Addendum)
Recommendation: ?Take Claritin 10mg  daily and Flonase nasal spray 1 puff per nostril daily during allergy season consistently (March-June) ? ?Continue to wear CPAP every night for 4-6 hours or longer ?Do not driving if experience excessive daytime sleepiness ? ?Orders: ?CXR re: chronic bronchitis  ? ?Follow-up: ?6 months with Eustaquio Maize NP if needed  ?

## 2022-03-10 NOTE — Progress Notes (Signed)
? ?@Patient  ID: Ann Walter, female    DOB: Jan 02, 1966, [age] y.o.   MRN: [id number] ? ?Chief Complaint  ?Patient presents with  ? Follow-up  ? ? ?Referring provider: ?Copland, Gwenlyn FoundJessica C, MD ? ?HPI: ?[age] year old female, never smoked tobacco (she smoked marijuana on and off her whole life). PMH significant for HTN, OSA, GAD, UACS, pre-diabetes, morbid obesity.  ? ?Previous [facility] pulmonary encounter:  ?02/03/2022 ?Patient presents today for sleep consult.  She has hx sleep apnea. She had a split-night sleep study in 2019 that showed severe sleep apnea, AHI 59/hr. Optimal pressure was 12cm h20 with small FFM with heated humidity. She is currently wearing CPAP and needs new supplies.  She is unsure of her DME company, previously [employer] which is now [employer]. Her typical bedtime is between 8:30-9:30pm. It does not take her long to fall asleep. She normally sleeps well through the night, waking up only 1-2 times to use restroom. Starts her day between 5:30-6:30am. She has lost 15 lbs in the last 2 years. She does not wear oxygen. No significant daytime sleepiness. Denies narcolepsy, cataplexy, sleep walking.  [place] 11.  ? ?OSA on CPAP ?- Split night 06/03/2018 >> severe OSA, AHI 59/hr. Optimal pressure 12cm h20. Patient reports compliance with CPAP and benefit from use. [employer] is sending compliance report, we will place an order to have CPAP supplies renewed. Continue to encourage patient wear CPAP every night. Advised against driving if experiencing excessive daytime sleepinesss. Continue to work on weight loss efforts. FU in 6 months or sooner if needed.  ? ? ?03/10/2022-interim hx  ?Patient present for follow-up OSA/review spirometry testing. Patient expressed concern to her PCP that she may have COPD d/t frequent bronchitis symptoms. She has never smoked before. No acute respiratory symptoms today. She tells me that this is the second or third year that she has had bronchitis. This typically occurs during pollen season. She  does not take anything over the counter. No shortness of breath except when walking long distances. She is working on [employer] loss. Spirometry today was normal with normal diffusion capacity. She had minimal curvature to FVL suggestive some mild small airway disease. She had a normal echocardiogram in June 2015. She reports compliance with CPAP, we have called [employer] for download request.  ? ?Allergies  ?Allergen Reactions  ? Wellbutrin [Bupropion] Swelling  ?  Throat swelling ?  ? Sulfonamide Derivatives Other (See Comments)  ?  Unknown per pt, it's been a long time  ? ? ?Immunization History  ?Administered Date(s) Administered  ? Influenza Split 07/08/2012  ? Influenza,inj,Quad PF,6+ Mos 11/27/2014, 07/03/2015, 07/09/2016, 06/24/2017, 07/07/2018, 09/01/2019, 08/09/2020, 11/25/2021  ? Tdap 05/16/2014  ? ? ?Past Medical History:  ?Diagnosis Date  ? Allergy   ? Benign hematuria 12/2009  ? WORKUP WITH DR. Brunilda PayorNESI WAS NEGATIVE  ? Depression   ? Family history of adverse reaction to anesthesia   ? My dad -  slow awaking  ? Headache   ? Mirgraine  - years ago  ? Heart murmur   ? HTN (hypertension)   ? Obesity   ? Pre-diabetes   ? Ringing in ears, bilateral   ? Sleep apnea   ? UTI (urinary tract infection), uncomplicated   ? ? ?Tobacco History: ?Social History  ? ?Tobacco Use  ?Smoking Status Never  ?Smokeless Tobacco Never  ?Tobacco Comments  ? Smokes marijuana every once in a while.  03/10/22  hfb  ? ?Counseling given: Not Answered ?Tobacco comments: Smokes  marijuana every once in a while.  03/10/22  hfb ? ? ?Outpatient Medications Prior to Visit  ?Medication Sig Dispense Refill  ? ALPRAZolam (XANAX) 0.5 MG tablet TAKE 1 TABLET BY MOUTH 3 TIMES A DAY AS NEEDED 90 tablet 2  ? desvenlafaxine (PRISTIQ) 100 MG 24 hr tablet Take 1 tablet (100 mg total) by mouth daily. 90 tablet 1  ? famotidine (PEPCID) 20 MG tablet Take 20 mg by mouth daily as needed for heartburn or indigestion.    ? Insulin Pen Needle (PEN NEEDLES) 32G X 4 MM  MISC Use one needle daily for injection 100 each 3  ? Liraglutide -Weight Management (SAXENDA) 18 MG/3ML SOPN Inject 0.6 mg into the skin daily. Increase dose weekly as directed to goal dose of 3 mg 15 mL 3  ? telmisartan (MICARDIS) 80 MG tablet Take 1 tablet (80 mg total) by mouth daily. 90 tablet 3  ? doxycycline (VIBRAMYCIN) 100 MG capsule Take 1 capsule (100 mg total) by mouth 2 (two) times daily. 20 capsule 0  ? metFORMIN (GLUCOPHAGE) 500 MG tablet Take 1 tablet (500 mg total) by mouth daily. (Patient not taking: Reported on 03/10/2022) 90 tablet 3  ? ?No facility-administered medications prior to visit.  ? ?Review of Systems ? ?Review of Systems  ?Constitutional: Negative.   ?HENT:  Positive for postnasal drip.   ?Respiratory:  Positive for shortness of breath. Negative for cough, chest tightness and wheezing.   ?Cardiovascular: Negative.   ?Psychiatric/Behavioral:  Negative for sleep disturbance.   ? ? ?Physical Exam ? ?BP 126/80 (BP Location: Left Arm, Patient Position: Sitting, Cuff Size: Large)   Pulse 84   Temp 98 ?F (36.7 ?C) (Oral)   Ht 5' 3.5" (1.613 m)   Wt 259 lb 6.4 oz (117.7 kg)   LMP  (LMP Unknown) Comment: pt states she is not have periods  SpO2 95%   BMI 45.23 kg/m?  ?Physical Exam ?Constitutional:   ?   Appearance: Normal appearance.  ?HENT:  ?   Head: Normocephalic and atraumatic.  ?   Mouth/Throat:  ?   Mouth: Mucous membranes are moist.  ?   Pharynx: Oropharynx is clear.  ?Cardiovascular:  ?   Rate and Rhythm: Normal rate and regular rhythm.  ?Pulmonary:  ?   Effort: Pulmonary effort is normal.  ?   Breath sounds: Normal breath sounds.  ?Skin: ?   General: Skin is warm and dry.  ?Neurological:  ?   General: No focal deficit present.  ?   Mental Status: She is alert and oriented to person, place, and time. Mental status is at baseline.  ?Psychiatric:     ?   Mood and Affect: Mood normal.     ?   Behavior: Behavior normal.     ?   Thought Content: Thought content normal.     ?    Judgment: Judgment normal.  ?  ? ?Lab Results: ? ?CBC ?   ?Component Value Date/Time  ? WBC 6.3 11/25/2021 1024  ? RBC 4.28 11/25/2021 1024  ? HGB 12.7 11/25/2021 1024  ? HCT 38.5 11/25/2021 1024  ? PLT 208.0 11/25/2021 1024  ? MCV 90.0 11/25/2021 1024  ? MCV 95.8 04/13/2013 1011  ? MCH 30.3 08/09/2020 1138  ? MCHC 32.9 11/25/2021 1024  ? RDW 14.2 11/25/2021 1024  ? LYMPHSABS 2.3 11/29/2016 2133  ? MONOABS 0.5 11/29/2016 2133  ? EOSABS 0.4 11/29/2016 2133  ? BASOSABS 0.0 11/29/2016 2133  ? ? ?BMET ?   ?  Component Value Date/Time  ? NA 140 11/25/2021 1024  ? K 4.8 11/25/2021 1024  ? CL 105 11/25/2021 1024  ? CO2 26 11/25/2021 1024  ? GLUCOSE 88 11/25/2021 1024  ? BUN 22 11/25/2021 1024  ? CREATININE 0.87 11/25/2021 1024  ? CREATININE 0.74 08/09/2020 1138  ? CALCIUM 9.2 11/25/2021 1024  ? GFRNONAA >60 12/19/2020 0600  ? GFRAA >60 03/25/2018 1636  ? ? ?BNP ?No results found for: BNP ? ?ProBNP ?No results found for: PROBNP ? ?Imaging: ?No results found. ? ? ?Assessment & Plan:  ? ?OSA on CPAP ?- Hx severe OSA. Split night sleep study 06/03/18 >> 59/hr, optimal CPAP pressure 12 cm h20. She reports compliance with CPAP use, requesting download from [employer] for review. No changes. FU in 6 months or sooner if needed.  ? ?Simple chronic bronchitis (HCC) ?- Patient has been getting bronchitis symptoms every spring for the last 2-3 years. Spirometry today was normal/ FEV 2.17 (84%), ratio 80. She is not consistently taking oral antihistamine or nasal spray. Advised she start Claritin 10mg  daily and Flonase nasal spray daily during March-June reason. Checking CXR today. Encourage smoking cessation.  ? ? ?04-02-1971, NP ?03/10/2022 ? ?

## 2022-03-10 NOTE — Assessment & Plan Note (Addendum)
-   Patient has been getting bronchitis symptoms every spring for the last 2-3 years. Spirometry today was normal/ FEV 2.17 (84%), ratio 80. She is not consistently taking oral antihistamine or nasal spray. Advised she start Claritin 10mg  daily and Flonase nasal spray daily during March-June reason. Checking CXR today. Encourage smoking cessation.  ?

## 2022-03-11 NOTE — Progress Notes (Signed)
Please let patient know CXR showed clear lungs, no acute/active cardiopulmonary disease

## 2022-04-07 ENCOUNTER — Telehealth: Payer: Self-pay

## 2022-04-07 NOTE — Telephone Encounter (Signed)
Initiated:  Robbi Garter (Key: [id number]) Saxenda 18MG pen-injectors   Form Ronny Bacon PA Form [phone] NCPDP)

## 2022-04-08 ENCOUNTER — Encounter: Payer: Self-pay | Admitting: Family Medicine

## 2022-04-08 NOTE — Telephone Encounter (Signed)
Why your request was denied: You do not meet the requirements of your plan. Current plan approved criteria covers this drug when you have completed at least 16 weeks of therapy and met these conditions: - You are at least 57 years of age or older - You lost at least 4 percent of your body weight or - You have continued to keep your initial 4 percent weight loss off Supporting documentation must be submitted. Your request has been denied based on the information we have.   Will attempt an Appeal.

## 2022-04-12 NOTE — Progress Notes (Unsigned)
[facility] [address] [place], [place] [address] [phone]  Date:  04/14/2022   Name:  Ann Walter   DOB:  [birth year]   MRN:  [id number]  PCP:  Pearline Cables, MD    Chief Complaint: No chief complaint on file.   History of Present Illness:  Ann Walter is a 56 y.o. very pleasant female patient who presents with the following:  Pt seen today with concern of following up on her progress with Bernie Covey Last seen by myself in February  History of prediabetes, hypertension, chronic anxiety/depression/dysthymia/irritability and anger, often related to her job  Shingrix  Labs done 1/23  Lab Results  Component Value Date   HGBA1C 6.1 11/25/2021     Patient Active Problem List   Diagnosis Date Noted   Simple chronic bronchitis (HCC) 03/10/2022   OSA on CPAP 02/03/2022   History of meniscal tear    Left shoulder pain 12/22/2017   Upper airway cough syndrome 09/14/2017   Pre-diabetes 11/22/2015   Morbid obesity due to excess calories (HCC) 11/21/2015   Ovarian cyst 02/28/2015   Chest pain, atypical 03/08/2014   Heart murmur 03/08/2014   Heart palpitations 03/08/2014   GAD (generalized anxiety disorder) 02/13/2014   Depression    Essential hypertension     Past Medical History:  Diagnosis Date   Allergy    Benign hematuria 12/2009   WORKUP WITH DR. Brunilda Payor WAS NEGATIVE   Depression    Family history of adverse reaction to anesthesia    My dad -  slow awaking   Headache    Mirgraine  - years ago   Heart murmur    HTN (hypertension)    Obesity    Pre-diabetes    Ringing in ears, bilateral    Sleep apnea    UTI (urinary tract infection), uncomplicated     Past Surgical History:  Procedure Laterality Date   CHOLECYSTECTOMY  10/2010   CYSTECTOMY     x 2 from chest   KNEE ARTHROSCOPY Right 12/19/2020   Procedure: RIGHT KNEE ARTHROSCOPY WITH DEBRIDEMENT;  Surgeon: Nadara Mustard, MD;   Location: [facility] OR;  Service: Orthopedics;  Laterality: Right;    Social History   Tobacco Use   Smoking status: Never   Smokeless tobacco: Never   Tobacco comments:    Smokes marijuana every once in a while.  03/10/22  hfb  Vaping Use   Vaping Use: Never used  Substance Use Topics   Alcohol use: Not Currently    Alcohol/week: 0.0 standard drinks of alcohol    Comment: occasionally   Drug use: No    Family History  Problem Relation Age of Onset   Diabetes Mother    Heart disease Mother        HAD TRIPLE BYPASS   Hypertension Mother    Kidney disease Father    Breast cancer Sister    Breast cancer Maternal Grandmother    Cancer Maternal Grandmother        LYMPH NODE CANCER   Cancer Maternal Aunt        COLORECTAL    COPD Brother    Kidney failure Brother    Colon cancer Neg Hx     Allergies  Allergen Reactions   Wellbutrin [Bupropion] Swelling    Throat swelling    Sulfonamide Derivatives Other (See Comments)    Unknown per pt, it's been a long time  Medication list has been reviewed and updated.  Current Outpatient Medications on File Prior to Visit  Medication Sig Dispense Refill   ALPRAZolam (XANAX) 0.5 MG tablet TAKE 1 TABLET BY MOUTH 3 TIMES A DAY AS NEEDED 90 tablet 2   desvenlafaxine (PRISTIQ) 100 MG 24 hr tablet Take 1 tablet (100 mg total) by mouth daily. 90 tablet 1   famotidine (PEPCID) 20 MG tablet Take 20 mg by mouth daily as needed for heartburn or indigestion.     Insulin Pen Needle (PEN NEEDLES) 32G X 4 MM MISC Use one needle daily for injection 100 each 3   Liraglutide -Weight Management (SAXENDA) 18 MG/3ML SOPN Inject 0.6 mg into the skin daily. Increase dose weekly as directed to goal dose of 3 mg 15 mL 3   telmisartan (MICARDIS) 80 MG tablet Take 1 tablet (80 mg total) by mouth daily. 90 tablet 3   No current facility-administered medications on file prior to visit.    Review of Systems:  As per HPI- otherwise negative.   Physical  Examination: There were no vitals filed for this visit. There were no vitals filed for this visit. There is no height or weight on file to calculate BMI. Ideal Body Weight:    GEN: no acute distress. HEENT: Atraumatic, Normocephalic.  Ears and Nose: No external deformity. CV: RRR, No M/G/R. No JVD. No thrill. No extra heart sounds. PULM: CTA B, no wheezes, crackles, rhonchi. No retractions. No resp. distress. No accessory muscle use. ABD: S, NT, ND, +BS. No rebound. No HSM. EXTR: No c/c/e PSYCH: Normally interactive. Conversant.    Assessment and Plan: ***  Signed Abbe Amsterdam, MD

## 2022-04-12 NOTE — Patient Instructions (Incomplete)
Good to see you again! Can do your 2nd Shingrix in 2-6 months as a nurse visit  Please see me in about 6 months and take care!

## 2022-04-14 ENCOUNTER — Other Ambulatory Visit: Payer: Self-pay | Admitting: Family Medicine

## 2022-04-14 ENCOUNTER — Telehealth: Payer: Self-pay

## 2022-04-14 ENCOUNTER — Encounter: Payer: Self-pay | Admitting: Family Medicine

## 2022-04-14 ENCOUNTER — Ambulatory Visit: Payer: BC Managed Care – PPO | Admitting: Family Medicine

## 2022-04-14 VITALS — BP 124/86 | HR 76 | Resp 20 | Ht 63.6 in | Wt 250.0 lb

## 2022-04-14 DIAGNOSIS — Z6841 Body Mass Index (BMI) 40.0 and over, adult: Secondary | ICD-10-CM | POA: Diagnosis not present

## 2022-04-14 DIAGNOSIS — Z23 Encounter for immunization: Secondary | ICD-10-CM

## 2022-04-14 MED ORDER — SAXENDA 18 MG/3ML ~~LOC~~ SOPN: 3.0000 mg | PEN_INJECTOR | Freq: Every day | SUBCUTANEOUS | 3 refills | Status: DC

## 2022-04-14 NOTE — Telephone Encounter (Signed)
Ann Walter (Key: [id number]) Saxenda 18MG pen-injectors   Form Ronny Bacon PA Form [phone] NCPDP)

## 2022-04-16 NOTE — Telephone Encounter (Signed)
Your prior authorization for Saxenda has been approved! MORE INFO For eligible patients, copay assistance may be available. To learn more and be redirected to the Saxenda website, click on the "More Info" button to the right. Please also note that you may need to schedule a follow-up visit with your patient prior to the expiration of this prior authorization, as updated patient weight may be required for reauthorization.  Message from plan: Your PA request has been approved. Additional information will be provided in the approval communication. (Message 1145) 

## 2022-05-06 ENCOUNTER — Other Ambulatory Visit: Payer: Self-pay | Admitting: Family Medicine

## 2022-05-06 DIAGNOSIS — F32A Depression, unspecified: Secondary | ICD-10-CM

## 2022-05-06 DIAGNOSIS — F411 Generalized anxiety disorder: Secondary | ICD-10-CM

## 2022-06-13 ENCOUNTER — Encounter: Payer: Self-pay | Admitting: Family Medicine

## 2022-06-15 ENCOUNTER — Encounter (HOSPITAL_COMMUNITY): Payer: Self-pay

## 2022-06-15 ENCOUNTER — Ambulatory Visit (INDEPENDENT_AMBULATORY_CARE_PROVIDER_SITE_OTHER): Payer: BC Managed Care – PPO

## 2022-06-15 ENCOUNTER — Ambulatory Visit (HOSPITAL_COMMUNITY)
Admission: EM | Admit: 2022-06-15 | Discharge: 2022-06-15 | Disposition: A | Discharge status: Home / Self Care | Payer: BC Managed Care – PPO | Attending: Emergency Medicine | Admitting: Emergency Medicine

## 2022-06-15 DIAGNOSIS — R059 Cough, unspecified: Secondary | ICD-10-CM | POA: Diagnosis not present

## 2022-06-15 DIAGNOSIS — Z20822 Contact with and (suspected) exposure to covid-19: Secondary | ICD-10-CM | POA: Insufficient documentation

## 2022-06-15 DIAGNOSIS — R062 Wheezing: Secondary | ICD-10-CM | POA: Diagnosis not present

## 2022-06-15 DIAGNOSIS — R0989 Other specified symptoms and signs involving the circulatory and respiratory systems: Secondary | ICD-10-CM | POA: Diagnosis not present

## 2022-06-15 DIAGNOSIS — J069 Acute upper respiratory infection, unspecified: Secondary | ICD-10-CM | POA: Insufficient documentation

## 2022-06-15 LAB — SARS CORONAVIRUS 2 BY RT PCR: SARS Coronavirus 2 by RT PCR: NEGATIVE

## 2022-06-15 MED ORDER — ONDANSETRON 4 MG PO TBDP: 4.0000 mg | ORAL_TABLET | Freq: Three times a day (TID) | ORAL | 0 refills | Status: DC | PRN

## 2022-06-15 MED ORDER — ONDANSETRON 4 MG PO TBDP
ORAL_TABLET | ORAL | Status: AC
  Filled 2022-06-15: qty 1

## 2022-06-15 MED ORDER — ONDANSETRON 4 MG PO TBDP
4.0000 mg | ORAL_TABLET | Freq: Once | ORAL | Status: AC
  Administered 2022-06-15: 4 mg via ORAL

## 2022-06-15 NOTE — ED Triage Notes (Addendum)
The pt states on Tuesday she began having nausea/ vomiting, headache, shoulder aches, heaviness to legs, and congestion  Home interventions: mucinex, hydration, motrin

## 2022-06-15 NOTE — Discharge Instructions (Addendum)
Your chest x-ray was negative. I recommend using the Zofran every 8 hours as needed for your nausea.  Please try to sip on water or other fluids to keep yourself hydrated.  Try mucinex twice daily for congestion.  Please go to the emergency department if symptoms worsen.

## 2022-06-15 NOTE — ED Provider Notes (Signed)
[facility]    CSN: [id number] Arrival date & time: 06/15/22  1001     History   Chief Complaint Chief Complaint  Patient presents with   Vomiting   Nasal Congestion   Generalized Body Aches    HPI Ann Walter is a 56 y.o. female.  Presents with 5-day history of multiple symptoms. On Tuesday she developed nausea, vomiting, body aches, cough and congestion.  She has tried mucinex and motrin for symptoms. Reports drinking diet mountain dew, no water. Has tried Mucinex and Zofran.  Reports she ran out of Zofran Denies fever, chills, sore throat, shortness of breath, chest pain. Some generalized abdominal discomfort with the vomiting.  Past Medical History:  Diagnosis Date   Allergy    Benign hematuria 12/2009   WORKUP WITH DR. Brunilda Payor WAS NEGATIVE   Depression    Family history of adverse reaction to anesthesia    My dad -  slow awaking   Headache    Mirgraine  - years ago   Heart murmur    HTN (hypertension)    Obesity    Pre-diabetes    Ringing in ears, bilateral    Sleep apnea    UTI (urinary tract infection), uncomplicated     Patient Active Problem List   Diagnosis Date Noted   Simple chronic bronchitis (HCC) 03/10/2022   OSA on CPAP 02/03/2022   Left shoulder pain 12/22/2017   Upper airway cough syndrome 09/14/2017   Pre-diabetes 11/22/2015   Morbid obesity due to excess calories (HCC) 11/21/2015   Ovarian cyst 02/28/2015   Chest pain, atypical 03/08/2014   Heart murmur 03/08/2014   Heart palpitations 03/08/2014   GAD (generalized anxiety disorder) 02/13/2014   Depression    Essential hypertension     Past Surgical History:  Procedure Laterality Date   CHOLECYSTECTOMY  10/2010   CYSTECTOMY     x 2 from chest   KNEE ARTHROSCOPY Right 12/19/2020   Procedure: RIGHT KNEE ARTHROSCOPY WITH DEBRIDEMENT;  Surgeon: Nadara Mustard, MD;  Location: [facility] OR;  Service: Orthopedics;  Laterality: Right;    OB History     Gravida  1   Para      Term       Preterm      AB  1   Living  0      SAB      IAB  1   Ectopic      Multiple      Live Births               Home Medications    Prior to Admission medications   Medication Sig Start Date End Date Taking? Authorizing Provider  ondansetron (ZOFRAN-ODT) 4 MG disintegrating tablet Take 1 tablet (4 mg total) by mouth every 8 (eight) hours as needed for nausea or vomiting. 06/15/22  Yes Mata Rowen, Lurena Joiner, PA-C  ALPRAZolam (XANAX) 0.5 MG tablet TAKE 1 TABLET BY MOUTH THREE TIMES A DAY AS NEEDED 05/06/22   Copland, Gwenlyn Found, MD  desvenlafaxine (PRISTIQ) 100 MG 24 hr tablet TAKE 1 TABLET BY MOUTH EVERY DAY 05/06/22   Copland, Gwenlyn Found, MD  famotidine (PEPCID) 20 MG tablet Take 20 mg by mouth daily as needed for heartburn or indigestion.    [provider]  Insulin Pen Needle (PEN NEEDLES) 32G X 4 MM MISC Use one needle daily for injection 11/25/21   Copland, Gwenlyn Found, MD  Liraglutide -Weight Management (SAXENDA) 18 MG/3ML SOPN Inject 3 mg into  the skin daily. 04/14/22   Copland, Gwenlyn Found, MD  telmisartan (MICARDIS) 80 MG tablet Take 1 tablet (80 mg total) by mouth daily. 12/23/21   Copland, Gwenlyn Found, MD    Family History Family History  Problem Relation Age of Onset   Diabetes Mother    Heart disease Mother        HAD TRIPLE BYPASS   Hypertension Mother    Kidney disease Father    Breast cancer Sister    Breast cancer Maternal Grandmother    Cancer Maternal Grandmother        LYMPH NODE CANCER   Cancer Maternal Aunt        COLORECTAL    COPD Brother    Kidney failure Brother    Colon cancer Neg Hx     Social History Social History   Tobacco Use   Smoking status: Never   Smokeless tobacco: Never   Tobacco comments:    Smokes marijuana every once in a while.  03/10/22  hfb  Vaping Use   Vaping Use: Never used  Substance Use Topics   Alcohol use: Not Currently    Alcohol/week: 0.0 standard drinks of alcohol    Comment: occasionally   Drug use: No      Allergies   Wellbutrin [bupropion] and Sulfonamide derivatives   Review of Systems Review of Systems Per HPI  Physical Exam Triage Vital Signs ED Triage Vitals  Enc Vitals Group     BP 06/15/22 1016 (!) 161/97     Pulse Rate 06/15/22 1016 83     Resp 06/15/22 1016 18     Temp 06/15/22 1016 98.2 F (36.8 C)     Temp Source 06/15/22 1016 Oral     SpO2 06/15/22 1016 97 %     Weight --      Height --      Head Circumference --      Peak Flow --      Pain Score 06/15/22 1015 4     Pain Loc --      Pain Edu? --      Excl. in GC? --    No data found.  Updated Vital Signs BP (!) 161/97 (BP Location: Left Arm)   Pulse 83   Temp 98.2 F (36.8 C) (Oral)   Resp 18   LMP  (LMP Unknown) Comment: pt states she is not have periods  SpO2 97%    Physical Exam Vitals and nursing note reviewed.  Constitutional:      General: She is not in acute distress.    Appearance: Normal appearance.  HENT:     Nose: Congestion present.     Mouth/Throat:     Pharynx: No posterior oropharyngeal erythema.  Eyes:     Conjunctiva/sclera: Conjunctivae normal.  Cardiovascular:     Rate and Rhythm: Normal rate and regular rhythm.     Pulses: Normal pulses.     Heart sounds: Normal heart sounds.  Pulmonary:     Effort: Pulmonary effort is normal. No respiratory distress.     Breath sounds: Wheezing present.  Abdominal:     General: Bowel sounds are normal.     Palpations: Abdomen is soft.     Tenderness: There is no abdominal tenderness.  Lymphadenopathy:     Cervical: No cervical adenopathy.  Neurological:     Mental Status: She is alert and oriented to person, place, and time.      UC Treatments / Results  Labs (all  labs ordered are listed, but only abnormal results are displayed) Labs Reviewed  SARS CORONAVIRUS 2 BY RT PCR    EKG  Radiology DG Chest 2 View  Result Date: 06/15/2022 CLINICAL DATA:  Cough, wheezing and rales. EXAM: CHEST - 2 VIEW COMPARISON:   03/10/2022 FINDINGS: Heart size and mediastinal contours are unremarkable. No pleural effusion or edema. No airspace opacities identified. Visualized osseous structures are unremarkable. IMPRESSION: No active cardiopulmonary abnormalities. Electronically Signed   By: Signa Kell M.D.   On: 06/15/2022 10:42    Procedures Procedures (including critical care time)  Medications Ordered in UC Medications  ondansetron (ZOFRAN-ODT) disintegrating tablet 4 mg (4 mg Oral Given 06/15/22 1054)    Initial Impression / Assessment and Plan / UC Course  I have reviewed the triage vital signs and the nursing notes.  Pertinent labs & imaging results that were available during my care of the patient were reviewed by me and considered in my medical decision making (see chart for details).  Zofran given, patient reports improvement in nausea and she was able to sip water in clinic. Chest x-ray negative. COVID test pending. Recommend continue symptomatic care at home.  Prescribe Zofran to use for nausea.  Recommend increase water intake and avoid the [facility].  Return precautions discussed, strict ED precautions.  Patient agrees to plan  Final Clinical Impressions(s) / UC Diagnoses   Final diagnoses:  Upper respiratory tract infection, unspecified type     Discharge Instructions      Your chest x-ray was negative. I recommend using the Zofran every 8 hours as needed for your nausea.  Please try to sip on water or other fluids to keep yourself hydrated.  Try mucinex twice daily for congestion.  Please go to the emergency department if symptoms worsen.     ED Prescriptions     Medication Sig Dispense Auth. Provider   ondansetron (ZOFRAN-ODT) 4 MG disintegrating tablet Take 1 tablet (4 mg total) by mouth every 8 (eight) hours as needed for nausea or vomiting. 20 tablet Kimla Furth, Lurena Joiner, PA-C      PDMP not reviewed this encounter.   Diannia Hogenson, Ray Church 06/15/22 1134

## 2022-07-04 NOTE — Progress Notes (Unsigned)
[facility] [address], [address] [place], [place] [address] [phone]  Date:  07/07/2022   Name:  Ann Walter   DOB:  1965/12/28   MRN:  [id number]  PCP:  Pearline Cables, MD    Chief Complaint: No chief complaint on file.   History of Present Illness:  Ann Walter is a 56 y.o. very pleasant female patient who presents with the following:  Patient seen today for follow-up Most recent visit with myself was in June History of prediabetes, hypertension, chronic anxiety/depression/dysthymia/irritability and anger, often related to her job At her last visit in June she was taking Saxenda and making good progress.  She had lost about 50 pounds total  Started Shingrix series in June, can give second dose today Can obtain lab work Flu shot  Patient Active Problem List   Diagnosis Date Noted   Simple chronic bronchitis (HCC) 03/10/2022   OSA on CPAP 02/03/2022   Left shoulder pain 12/22/2017   Upper airway cough syndrome 09/14/2017   Pre-diabetes 11/22/2015   Morbid obesity due to excess calories (HCC) 11/21/2015   Ovarian cyst 02/28/2015   Chest pain, atypical 03/08/2014   Heart murmur 03/08/2014   Heart palpitations 03/08/2014   GAD (generalized anxiety disorder) 02/13/2014   Depression    Essential hypertension     Past Medical History:  Diagnosis Date   Allergy    Benign hematuria 12/2009   WORKUP WITH DR. Brunilda Payor WAS NEGATIVE   Depression    Family history of adverse reaction to anesthesia    My dad -  slow awaking   Headache    Mirgraine  - years ago   Heart murmur    HTN (hypertension)    Obesity    Pre-diabetes    Ringing in ears, bilateral    Sleep apnea    UTI (urinary tract infection), uncomplicated     Past Surgical History:  Procedure Laterality Date   CHOLECYSTECTOMY  10/2010   CYSTECTOMY     x 2 from chest   KNEE ARTHROSCOPY Right 12/19/2020   Procedure: RIGHT KNEE ARTHROSCOPY WITH  DEBRIDEMENT;  Surgeon: Nadara Mustard, MD;  Location: [facility] OR;  Service: Orthopedics;  Laterality: Right;    Social History   Tobacco Use   Smoking status: Never   Smokeless tobacco: Never   Tobacco comments:    Smokes marijuana every once in a while.  03/10/22  hfb  Vaping Use   Vaping Use: Never used  Substance Use Topics   Alcohol use: Not Currently    Alcohol/week: 0.0 standard drinks of alcohol    Comment: occasionally   Drug use: No    Family History  Problem Relation Age of Onset   Diabetes Mother    Heart disease Mother        HAD TRIPLE BYPASS   Hypertension Mother    Kidney disease Father    Breast cancer Sister    Breast cancer Maternal Grandmother    Cancer Maternal Grandmother        LYMPH NODE CANCER   Cancer Maternal Aunt        COLORECTAL    COPD Brother    Kidney failure Brother    Colon cancer Neg Hx     Allergies  Allergen Reactions   Wellbutrin [Bupropion] Swelling    Throat swelling    Sulfonamide Derivatives Other (See Comments)    Unknown per pt, it's been  a long time    Medication list has been reviewed and updated.  Current Outpatient Medications on File Prior to Visit  Medication Sig Dispense Refill   ALPRAZolam (XANAX) 0.5 MG tablet TAKE 1 TABLET BY MOUTH THREE TIMES A DAY AS NEEDED 90 tablet 2   desvenlafaxine (PRISTIQ) 100 MG 24 hr tablet TAKE 1 TABLET BY MOUTH EVERY DAY 90 tablet 3   famotidine (PEPCID) 20 MG tablet Take 20 mg by mouth daily as needed for heartburn or indigestion.     Insulin Pen Needle (PEN NEEDLES) 32G X 4 MM MISC Use one needle daily for injection 100 each 3   Liraglutide -Weight Management (SAXENDA) 18 MG/3ML SOPN Inject 3 mg into the skin daily. 45 mL 3   ondansetron (ZOFRAN-ODT) 4 MG disintegrating tablet Take 1 tablet (4 mg total) by mouth every 8 (eight) hours as needed for nausea or vomiting. 20 tablet 0   telmisartan (MICARDIS) 80 MG tablet Take 1 tablet (80 mg total) by mouth daily. 90 tablet 3   No  current facility-administered medications on file prior to visit.    Review of Systems:  As per HPI- otherwise negative.   Physical Examination: There were no vitals filed for this visit. There were no vitals filed for this visit. There is no height or weight on file to calculate BMI. Ideal Body Weight:    GEN: no acute distress. HEENT: Atraumatic, Normocephalic.  Ears and Nose: No external deformity. CV: RRR, No M/G/R. No JVD. No thrill. No extra heart sounds. PULM: CTA B, no wheezes, crackles, rhonchi. No retractions. No resp. distress. No accessory muscle use. ABD: S, NT, ND, +BS. No rebound. No HSM. EXTR: No c/c/e PSYCH: Normally interactive. Conversant.    Assessment and Plan: ***  Signed Abbe Amsterdam, MD

## 2022-07-07 ENCOUNTER — Ambulatory Visit: Payer: BC Managed Care – PPO | Admitting: Family Medicine

## 2022-07-07 ENCOUNTER — Encounter: Payer: Self-pay | Admitting: Family Medicine

## 2022-07-07 VITALS — BP 124/80 | HR 72 | Temp 97.8°F | Resp 18 | Ht 63.6 in | Wt 254.0 lb

## 2022-07-07 DIAGNOSIS — Z23 Encounter for immunization: Secondary | ICD-10-CM

## 2022-07-07 DIAGNOSIS — F32A Depression, unspecified: Secondary | ICD-10-CM | POA: Diagnosis not present

## 2022-07-07 DIAGNOSIS — N951 Menopausal and female climacteric states: Secondary | ICD-10-CM

## 2022-07-07 DIAGNOSIS — Z6841 Body Mass Index (BMI) 40.0 and over, adult: Secondary | ICD-10-CM | POA: Diagnosis not present

## 2022-07-07 DIAGNOSIS — I10 Essential (primary) hypertension: Secondary | ICD-10-CM | POA: Diagnosis not present

## 2022-07-07 DIAGNOSIS — R7303 Prediabetes: Secondary | ICD-10-CM | POA: Diagnosis not present

## 2022-07-07 DIAGNOSIS — F411 Generalized anxiety disorder: Secondary | ICD-10-CM | POA: Diagnosis not present

## 2022-07-07 DIAGNOSIS — R110 Nausea: Secondary | ICD-10-CM

## 2022-07-07 LAB — COMPREHENSIVE METABOLIC PANEL
ALT: 11 U/L (ref 0–35)
AST: 13 U/L (ref 0–37)
Albumin: 3.9 g/dL (ref 3.5–5.2)
Alkaline Phosphatase: 99 U/L (ref 39–117)
BUN: 12 mg/dL (ref 6–23)
CO2: 26 mEq/L (ref 19–32)
Calcium: 8.9 mg/dL (ref 8.4–10.5)
Chloride: 105 mEq/L (ref 96–112)
Creatinine, Ser: 0.87 mg/dL (ref 0.40–1.20)
GFR: 74.33 mL/min (ref >60.00)
Glucose, Bld: 85 mg/dL (ref 70–99)
Potassium: 4.4 mEq/L (ref 3.5–5.1)
Sodium: 139 mEq/L (ref 135–145)
Total Bilirubin: 0.4 mg/dL (ref 0.2–1.2)
Total Protein: 6.4 g/dL (ref 6.0–8.3)

## 2022-07-07 LAB — LIPID PANEL
Cholesterol: 121 mg/dL (ref 0–200)
HDL: 51.9 mg/dL (ref >39.00)
LDL Cholesterol: 58 mg/dL (ref 0–99)
NonHDL: 69.5
Total CHOL/HDL Ratio: 2
Triglycerides: 58 mg/dL (ref 0.0–149.0)
VLDL: 11.6 mg/dL (ref 0.0–40.0)

## 2022-07-07 LAB — CBC
HCT: 36.9 % (ref 36.0–46.0)
Hemoglobin: 12.3 g/dL (ref 12.0–15.0)
MCHC: 33.2 g/dL (ref 30.0–36.0)
MCV: 90.4 fl (ref 78.0–100.0)
Platelets: 222 10*3/uL (ref 150.0–400.0)
RBC: 4.08 Mil/uL (ref 3.87–5.11)
RDW: 14.7 % (ref 11.5–15.5)
WBC: 6.3 10*3/uL (ref 4.0–10.5)

## 2022-07-07 LAB — TSH: TSH: 0.78 u[IU]/mL (ref 0.35–5.50)

## 2022-07-07 LAB — FOLLICLE STIMULATING HORMONE: FSH: 56.3 m[IU]/mL

## 2022-07-07 LAB — HEMOGLOBIN A1C: Hgb A1c MFr Bld: 6.2 % (ref 4.6–6.5)

## 2022-07-07 LAB — VITAMIN D 25 HYDROXY (VIT D DEFICIENCY, FRACTURES): VITD: 33.96 ng/mL (ref 30.00–100.00)

## 2022-07-07 MED ORDER — ONDANSETRON 4 MG PO TBDP: 4.0000 mg | ORAL_TABLET | Freq: Three times a day (TID) | ORAL | 1 refills | Status: DC | PRN

## 2022-07-07 NOTE — Patient Instructions (Addendum)
Good to see you today- I will be in touch with your labs 2nd dose of shingles vaccine and flu shot today  I do really encourage you to find a counselor- I hope counseling might help you!   Otherwise assuming all is well please see me in about 6 months

## 2022-07-17 ENCOUNTER — Encounter (HOSPITAL_COMMUNITY): Payer: Self-pay

## 2022-07-17 ENCOUNTER — Ambulatory Visit (HOSPITAL_COMMUNITY)
Admission: EM | Admit: 2022-07-17 | Discharge: 2022-07-17 | Disposition: A | Discharge status: Home / Self Care | Payer: BC Managed Care – PPO | Attending: Family Medicine | Admitting: Family Medicine

## 2022-07-17 DIAGNOSIS — S61411A Laceration without foreign body of right hand, initial encounter: Secondary | ICD-10-CM

## 2022-07-17 MED ORDER — BACITRACIN ZINC 500 UNIT/GM EX OINT
TOPICAL_OINTMENT | CUTANEOUS | Status: AC
  Filled 2022-07-17: qty 0.9

## 2022-07-17 MED ORDER — LIDOCAINE-EPINEPHRINE 1 %-1:100000 IJ SOLN
INTRAMUSCULAR | Status: AC
  Filled 2022-07-17: qty 1

## 2022-07-17 NOTE — Discharge Instructions (Addendum)
We placed 4 stitches in the longer cut, and 2 stitches in the short when.  Stitches need to come out in 5 to 7 days  Clean the wounds 1-2 times daily with soap and water or with hydrogen peroxide, and put new antibiotic ointment on

## 2022-07-17 NOTE — ED Provider Notes (Signed)
[facility]    CSN: [id number] Arrival date & time: 07/17/22  5885      History   Chief Complaint Chief Complaint  Patient presents with   Extremity Laceration    HPI Ann Walter is a 56 y.o. female.   HPI Here for pain and cuts to her right hand  This AM when she had her dog out, a neighbor dog came out, curious about food pt had put out. The dogs got in a fight, and the neighbor dog bit her in her right hand.  Last tetanus 2015    Past Medical History:  Diagnosis Date   Allergy    Benign hematuria 12/2009   WORKUP WITH DR. Brunilda Payor WAS NEGATIVE   Depression    Family history of adverse reaction to anesthesia    My dad -  slow awaking   Headache    Mirgraine  - years ago   Heart murmur    HTN (hypertension)    Obesity    Pre-diabetes    Ringing in ears, bilateral    Sleep apnea    UTI (urinary tract infection), uncomplicated     Patient Active Problem List   Diagnosis Date Noted   Simple chronic bronchitis (HCC) 03/10/2022   OSA on CPAP 02/03/2022   Left shoulder pain 12/22/2017   Upper airway cough syndrome 09/14/2017   Pre-diabetes 11/22/2015   Morbid obesity due to excess calories (HCC) 11/21/2015   Ovarian cyst 02/28/2015   Chest pain, atypical 03/08/2014   Heart murmur 03/08/2014   Heart palpitations 03/08/2014   GAD (generalized anxiety disorder) 02/13/2014   Depression    Essential hypertension     Past Surgical History:  Procedure Laterality Date   CHOLECYSTECTOMY  10/2010   CYSTECTOMY     x 2 from chest   KNEE ARTHROSCOPY Right 12/19/2020   Procedure: RIGHT KNEE ARTHROSCOPY WITH DEBRIDEMENT;  Surgeon: Nadara Mustard, MD;  Location: [facility] OR;  Service: Orthopedics;  Laterality: Right;    OB History     Gravida  1   Para      Term      Preterm      AB  1   Living  0      SAB      IAB  1   Ectopic      Multiple      Live Births               Home Medications    Prior to Admission medications    Medication Sig Start Date End Date Taking? Authorizing Provider  ALPRAZolam (XANAX) 0.5 MG tablet TAKE 1 TABLET BY MOUTH THREE TIMES A DAY AS NEEDED 05/06/22   Copland, Gwenlyn Found, MD  desvenlafaxine (PRISTIQ) 100 MG 24 hr tablet TAKE 1 TABLET BY MOUTH EVERY DAY 05/06/22   Copland, Gwenlyn Found, MD  famotidine (PEPCID) 20 MG tablet Take 20 mg by mouth daily as needed for heartburn or indigestion.    [provider]  Insulin Pen Needle (PEN NEEDLES) 32G X 4 MM MISC Use one needle daily for injection 11/25/21   Copland, Gwenlyn Found, MD  Liraglutide -Weight Management (SAXENDA) 18 MG/3ML SOPN Inject 3 mg into the skin daily. 04/14/22   Copland, Gwenlyn Found, MD  telmisartan (MICARDIS) 80 MG tablet Take 1 tablet (80 mg total) by mouth daily. 12/23/21   Copland, Gwenlyn Found, MD    Family History Family History  Problem Relation Age of Onset  Diabetes Mother    Heart disease Mother        HAD TRIPLE BYPASS   Hypertension Mother    Kidney disease Father    Breast cancer Sister    Breast cancer Maternal Grandmother    Cancer Maternal Grandmother        LYMPH NODE CANCER   Cancer Maternal Aunt        COLORECTAL    COPD Brother    Kidney failure Brother    Colon cancer Neg Hx     Social History Social History   Tobacco Use   Smoking status: Never   Smokeless tobacco: Never   Tobacco comments:    Smokes marijuana every once in a while.  03/10/22  hfb  Vaping Use   Vaping Use: Never used  Substance Use Topics   Alcohol use: Not Currently    Alcohol/week: 0.0 standard drinks of alcohol    Comment: occasionally   Drug use: No     Allergies   Wellbutrin [bupropion] and Sulfonamide derivatives   Review of Systems Review of Systems   Physical Exam Triage Vital Signs ED Triage Vitals  Enc Vitals Group     BP 07/17/22 0817 (!) 159/88     Pulse Rate 07/17/22 0817 87     Resp 07/17/22 0817 (!) 22     Temp 07/17/22 0817 98.4 F (36.9 C)     Temp Source 07/17/22 0817 Oral     SpO2  07/17/22 0817 96 %     Weight --      Height --      Head Circumference --      Peak Flow --      Pain Score 07/17/22 0816 9     Pain Loc --      Pain Edu? --      Excl. in GC? --    No data found.  Updated Vital Signs BP (!) 159/88 (BP Location: Left Arm)   Pulse 87   Temp 98.4 F (36.9 C) (Oral)   Resp (!) 22   LMP  (LMP Unknown) Comment: pt states she is not have periods  SpO2 96%   Visual Acuity Right Eye Distance:   Left Eye Distance:   Bilateral Distance:    Right Eye Near:   Left Eye Near:    Bilateral Near:     Physical Exam Vitals reviewed.  Constitutional:      General: She is not in acute distress.    Appearance: She is not ill-appearing, toxic-appearing or diaphoretic.  Skin:    Comments: There is a laceration about 2.5 cm on the dorsum of her right hand.  It exposes her tendons.  There is no foreign body in the laceration bed.  There is a smaller laceration about another 0.5 cm on the dorsum of the hand lateral to the first noted laceration.  There is also a puncture wound proximal to those.  Her ring finger nail has a little blood seeping around the entire nail fold and underneath the end of the nail.  It is not avulsed.  Neurological:     General: No focal deficit present.     Mental Status: She is alert and oriented to person, place, and time.  Psychiatric:        Behavior: Behavior normal.      UC Treatments / Results  Labs (all labs ordered are listed, but only abnormal results are displayed) Labs Reviewed - No data to display  EKG  Radiology No results found.  Procedures Procedures (including critical care time)  Medications Ordered in UC Medications - No data to display  Initial Impression / Assessment and Plan / UC Course  I have reviewed the triage vital signs and the nursing notes.  Pertinent labs & imaging results that were available during my care of the patient were reviewed by me and considered in my medical decision  making (see chart for details).        After verbal consent is obtained, 1% lidocaine with epinephrine is used to infiltrate the laceration on the dorsum of the hand in the adjacent smaller laceration.  Irrigation is done with normal saline.  Under clean conditions, 5-0 nylon is used to approximate the skin edges of the longer laceration with 4 sutures.  2 sutures were placed in the shorter laceration.  Good hemostasis is achieved and good approximation of the skin edges is achieved.  Bandages were applied to all obvious lacerations and puncture wounds.  Wound care is explained.  No complications.  EBL 0  Sutures need to come out in 5 to 7 days.  Warnings are given for her to return if there is signs of infection. 5 ohFinal Clinical Impressions(s) / UC Diagnoses   Final diagnoses:  Laceration of right hand without foreign body, initial encounter     Discharge Instructions      We placed 4 stitches in the longer cut, and 2 stitches in the short when.  Stitches need to come out in 5 to 7 days  Clean the wounds 1-2 times daily with soap and water or with hydrogen peroxide, and put new antibiotic ointment on     ED Prescriptions   None    PDMP not reviewed this encounter.   Barrett Henle, MD 07/17/22 [phone]

## 2022-07-17 NOTE — ED Triage Notes (Signed)
Patient with dog bite to right hand. States she was breaking up a fight between two dogs. Laceration noted to top of right hand. Bleeding controlled at this time.

## 2022-08-04 ENCOUNTER — Other Ambulatory Visit: Payer: Self-pay | Admitting: Family Medicine

## 2022-08-04 DIAGNOSIS — F411 Generalized anxiety disorder: Secondary | ICD-10-CM

## 2022-08-04 DIAGNOSIS — R110 Nausea: Secondary | ICD-10-CM

## 2022-08-10 NOTE — Progress Notes (Unsigned)
@Patient  ID: , female    DOB: 02-May-1966, 56 y.o.   MRN: [id number]  No chief complaint on file.   Referring provider: [id number], MD  HPI: 56 year old female, never smoked tobacco (she smoked marijuana on and off her whole life). PMH significant for HTN, OSA, GAD, UACS, pre-diabetes, morbid obesity.   Previous [facility] pulmonary encounter:  02/03/2022 Patient presents today for sleep consult.  She has hx sleep apnea. She had a split-night sleep study in 2019 that showed severe sleep apnea, AHI 59/hr. Optimal pressure was 12cm h20 with small FFM with heated humidity. She is currently wearing CPAP and needs new supplies.  She is unsure of her DME company, previously [employer] which is now [employer]. Her typical bedtime is between 8:30-9:30pm. It does not take her long to fall asleep. She normally sleeps well through the night, waking up only 1-2 times to use restroom. Starts her day between 5:30-6:30am. She has lost 15 lbs in the last 2 years. She does not wear oxygen. No significant daytime sleepiness. Denies narcolepsy, cataplexy, sleep walking.  [place] 11.   OSA on CPAP - Split night 06/03/2018 >> severe OSA, AHI 59/hr. Optimal pressure 12cm h20. Patient reports compliance with CPAP and benefit from use. [employer] is sending compliance report, we will place an order to have CPAP supplies renewed. Continue to encourage patient wear CPAP every night. Advised against driving if experiencing excessive daytime sleepinesss. Continue to work on weight loss efforts. FU in 6 months or sooner if needed.   03/10/2022 Patient present for follow-up OSA/review spirometry testing. Patient expressed concern to her PCP that she may have COPD d/t frequent bronchitis symptoms. She has never smoked before. No acute respiratory symptoms today. She tells me that this is the second or third year that she has had bronchitis. This typically occurs during pollen season. She does not take anything over the counter.  No shortness of breath except when walking long distances. She is working on 03/12/2022 loss. Spirometry today was normal with normal diffusion capacity. She had minimal curvature to FVL suggestive some mild small airway disease. She had a normal echocardiogram in June 2015. She reports compliance with CPAP, we have called [employer] for download request.    08/11/2022- Interim hx Patient presents today for 6 month follow-up/ OSA and chronic bronchitis.           Allergies  Allergen Reactions   Wellbutrin [Bupropion] Swelling    Throat swelling    Sulfonamide Derivatives Other (See Comments)    Unknown per pt, it's been a long time    Immunization History  Administered Date(s) Administered   Influenza Split 07/08/2012   Influenza,inj,Quad PF,6+ Mos 11/27/2014, 07/03/2015, 07/09/2016, 06/24/2017, 07/07/2018, 09/01/2019, 08/09/2020, 11/25/2021, 07/07/2022   Tdap 05/16/2014   Zoster Recombinat (Shingrix) 04/14/2022, 07/07/2022    Past Medical History:  Diagnosis Date   Allergy    Benign hematuria 12/2009   WORKUP WITH DR. 01/2010 WAS NEGATIVE   Depression    Family history of adverse reaction to anesthesia    My dad -  slow awaking   Headache    Mirgraine  - years ago   Heart murmur    HTN (hypertension)    Obesity    Pre-diabetes    Ringing in ears, bilateral    Sleep apnea    UTI (urinary tract infection), uncomplicated     Tobacco History: Social History   Tobacco Use  Smoking Status Never  Smokeless Tobacco Never  Tobacco Comments   Smokes marijuana every once in a while.  03/10/22  hfb   Counseling given: Not Answered Tobacco comments: Smokes marijuana every once in a while.  03/10/22  hfb   Outpatient Medications Prior to Visit  Medication Sig Dispense Refill   ALPRAZolam (XANAX) 0.5 MG tablet TAKE 1 TABLET BY MOUTH THREE TIMES A DAY AS NEEDED 90 tablet 2   desvenlafaxine (PRISTIQ) 100 MG 24 hr tablet TAKE 1 TABLET BY MOUTH EVERY DAY 90 tablet 3   famotidine  (PEPCID) 20 MG tablet Take 20 mg by mouth daily as needed for heartburn or indigestion.     Insulin Pen Needle (PEN NEEDLES) 32G X 4 MM MISC Use one needle daily for injection 100 each 3   Liraglutide -Weight Management (SAXENDA) 18 MG/3ML SOPN Inject 3 mg into the skin daily. 45 mL 3   ondansetron (ZOFRAN-ODT) 4 MG disintegrating tablet Take 1 tablet (4 mg total) by mouth every 8 (eight) hours as needed for nausea or vomiting. 20 tablet 1   telmisartan (MICARDIS) 80 MG tablet Take 1 tablet (80 mg total) by mouth daily. 90 tablet 3   No facility-administered medications prior to visit.      Review of Systems  Review of Systems   Physical Exam  LMP  (LMP Unknown) Comment: pt states she is not have periods Physical Exam   Lab Results:  CBC    Component Value Date/Time   WBC 6.3 07/07/2022 1052   RBC 4.08 07/07/2022 1052   HGB 12.3 07/07/2022 1052   HCT 36.9 07/07/2022 1052   PLT 222.0 07/07/2022 1052   MCV 90.4 07/07/2022 1052   MCV 95.8 04/13/2013 1011   MCH 30.3 08/09/2020 1138   MCHC 33.2 07/07/2022 1052   RDW 14.7 07/07/2022 1052   LYMPHSABS 2.3 11/29/2016 2133   MONOABS 0.5 11/29/2016 2133   EOSABS 0.4 11/29/2016 2133   BASOSABS 0.0 11/29/2016 2133    BMET    Component Value Date/Time   NA 139 07/07/2022 1052   K 4.4 07/07/2022 1052   CL 105 07/07/2022 1052   CO2 26 07/07/2022 1052   GLUCOSE 85 07/07/2022 1052   BUN 12 07/07/2022 1052   CREATININE 0.87 07/07/2022 1052   CREATININE 0.74 08/09/2020 1138   CALCIUM 8.9 07/07/2022 1052   GFRNONAA >60 12/19/2020 0600   GFRAA >60 03/25/2018 1636    BNP No results found for: "BNP"  ProBNP No results found for: "PROBNP"  Imaging: No results found.   Assessment & Plan:   No problem-specific Assessment & Plan notes found for this encounter.     Martyn Ehrich, NP 08/10/2022

## 2022-08-11 ENCOUNTER — Encounter: Payer: Self-pay | Admitting: Primary Care

## 2022-08-11 ENCOUNTER — Ambulatory Visit: Payer: BC Managed Care – PPO | Admitting: Primary Care

## 2022-08-11 VITALS — BP 130/88 | HR 64 | Ht 63.5 in | Wt 255.6 lb

## 2022-08-11 DIAGNOSIS — G4733 Obstructive sleep apnea (adult) (pediatric): Secondary | ICD-10-CM | POA: Diagnosis not present

## 2022-08-11 DIAGNOSIS — J410 Simple chronic bronchitis: Secondary | ICD-10-CM

## 2022-08-11 NOTE — Assessment & Plan Note (Signed)
-   Hx severe OSA. Split night sleep study 06/03/18, AHI 59/hour. Optimal pressure 12cm h20. Patient is 100% compliant with CPAP use last 30 days. Current pressure 12cm h20; Residual AHI 1.4/hr. No changes needed today. Continue to encourage patient wear CPAP every night and work on weight loss efforts. FU in 1 year or sooner if needed.

## 2022-08-11 NOTE — Patient Instructions (Signed)
Nice seeing you today Ann Walter, excellent compliance with CPAP No changes needed to pressure settings today Continue to wear CPAP every night for minimum 4 to 6 hours or longer Continue weight loss efforts as you have been  Follow-up 1 year with [facility] NP or sooner if needed  CPAP and BIPAP Information CPAP and BIPAP are methods that use air pressure to keep your airways open and to help you breathe well. CPAP and BIPAP use different amounts of pressure. Your health care provider will tell you whether CPAP or BIPAP would be more helpful for you. CPAP stands for "continuous positive airway pressure." With CPAP, the amount of pressure stays the same while you breathe in (inhale) and out (exhale). BIPAP stands for "bi-level positive airway pressure." With BIPAP, the amount of pressure will be higher when you inhale and lower when you exhale. This allows you to take larger breaths. CPAP or BIPAP may be used in the hospital, or your health care provider may want you to use it at home. You may need to have a sleep study before your health care provider can order a machine for you to use at home. What are the advantages? CPAP or BIPAP can be helpful if you have: Sleep apnea. Chronic obstructive pulmonary disease (COPD). Heart failure. Medical conditions that cause muscle weakness, including muscular dystrophy or amyotrophic lateral sclerosis (ALS). Other problems that cause breathing to be shallow, weak, abnormal, or difficult. CPAP and BIPAP are most commonly used for obstructive sleep apnea (OSA) to keep the airways from collapsing when the muscles relax during sleep. What are the risks? Generally, this is a safe treatment. However, problems may occur, including: Irritated skin or skin sores if the mask does not fit properly. Dry or stuffy nose or nosebleeds. Dry mouth. Feeling gassy or bloated. Sinus or lung infection if the equipment is not cleaned properly. When should CPAP or BIPAP be  used? In most cases, the mask only needs to be worn during sleep. Generally, the mask needs to be worn throughout the night and during any daytime naps. People with certain medical conditions may also need to wear the mask at other times, such as when they are awake. Follow instructions from your health care provider about when to use the machine. What happens during CPAP or BIPAP?  Both CPAP and BIPAP are provided by a small machine with a flexible plastic tube that attaches to a plastic mask that you wear. Air is blown through the mask into your nose or mouth. The amount of pressure that is used to blow the air can be adjusted on the machine. Your health care provider will set the pressure setting and help you find the best mask for you. Tips for using the mask Because the mask needs to be snug, some people feel trapped or closed-in (claustrophobic) when first using the mask. If you feel this way, you may need to get used to the mask. One way to do this is to hold the mask loosely over your nose or mouth and then gradually apply the mask more snugly. You can also gradually increase the amount of time that you use the mask. Masks are available in various types and sizes. If your mask does not fit well, talk with your health care provider about getting a different one. Some common types of masks include: Full face masks, which fit over the mouth and nose. Nasal masks, which fit over the nose. Nasal pillow or prong masks, which fit  into the nostrils. If you are using a mask that fits over your nose and you tend to breathe through your mouth, a chin strap may be applied to help keep your mouth closed. Use a skin barrier to protect your skin as told by your health care provider. Some CPAP and BIPAP machines have alarms that may sound if the mask comes off or develops a leak. If you have trouble with the mask, it is very important that you talk with your health care provider about finding a way to make the  mask easier to tolerate. Do not stop using the mask. There could be a negative impact on your health if you stop using the mask. Tips for using the machine Place your CPAP or BIPAP machine on a secure table or stand near an electrical outlet. Know where the on/off switch is on the machine. Follow instructions from your health care provider about how to set the pressure on your machine and when you should use it. Do not eat or drink while the CPAP or BIPAP machine is on. Food or fluids could get pushed into your lungs by the pressure of the CPAP or BIPAP. For home use, CPAP and BIPAP machines can be rented or purchased through home health care companies. Many different brands of machines are available. Renting a machine before purchasing may help you find out which particular machine works well for you. Your health insurance company may also decide which machine you may get. Keep the CPAP or BIPAP machine and attachments clean. Ask your health care provider for specific instructions. Check the humidifier if you have a dry stuffy nose or nosebleeds. Make sure it is working correctly. Follow these instructions at home: Take over-the-counter and prescription medicines only as told by your health care provider. Ask if you can take sinus medicine if your sinuses are blocked. Do not use any products that contain nicotine or tobacco. These products include cigarettes, chewing tobacco, and vaping devices, such as e-cigarettes. If you need help quitting, ask your health care provider. Keep all follow-up visits. This is important. Contact a health care provider if: You have redness or pressure sores on your head, face, mouth, or nose from the mask or head gear. You have trouble using the CPAP or BIPAP machine. You cannot tolerate wearing the CPAP or BIPAP mask. Someone tells you that you snore even when wearing your CPAP or BIPAP. Get help right away if: You have trouble breathing. You feel  confused. Summary CPAP and BIPAP are methods that use air pressure to keep your airways open and to help you breathe well. If you have trouble with the mask, it is very important that you talk with your health care provider about finding a way to make the mask easier to tolerate. Do not stop using the mask. There could be a negative impact to your health if you stop using the mask. Follow instructions from your health care provider about when to use the machine. This information is not intended to replace advice given to you by your health care provider. Make sure you discuss any questions you have with your health care provider. Document Revised: 05/22/2021 Document Reviewed: 09/21/2020 Elsevier Patient Education  [place].

## 2022-08-11 NOTE — Assessment & Plan Note (Signed)
-   Resolved; No coughing and dyspnea symptoms reported

## 2022-09-03 ENCOUNTER — Other Ambulatory Visit: Payer: Self-pay | Admitting: Family Medicine

## 2022-09-14 ENCOUNTER — Other Ambulatory Visit: Payer: Self-pay | Admitting: Family Medicine

## 2022-10-10 DIAGNOSIS — G4733 Obstructive sleep apnea (adult) (pediatric): Secondary | ICD-10-CM | POA: Diagnosis not present

## 2022-10-31 ENCOUNTER — Other Ambulatory Visit: Payer: Self-pay | Admitting: Family Medicine

## 2022-10-31 NOTE — Progress Notes (Deleted)
[place] at [facility] [address] [place], [place] [address] [phone]  Date:  11/03/2022   Name:  Ann Walter   DOB:  1966/06/18   MRN:  [id number]  PCP:  Darreld Mclean, MD    Chief Complaint: No chief complaint on file.   History of Present Illness:  Ann Walter is a 58 y.o. very pleasant female patient who presents with the following:  Patient seen today for a follow-up visit- History of prediabetes, hypertension, chronic anxiety/depression/dysthymia/irritability and anger, often related to her job.   Most recent visit with myself was in September.  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 Split night sleep study 06/03/18, AHI 59/hour. Optimal pressure 12cm h20. Patient is 100% compliant with CPAP use last 30 days. Current pressure 12cm h20; Residual AHI 1.4/hr. No changes needed today. Continue to encourage patient wear CPAP every night and work on weight loss efforts. FU in 1 year or sooner if needed.    She has been working on weight loss with Saxenda  Recommend COVID-vaccine Mammogram, Pap, colonoscopy up-to-date Lab work done in [facility] 6.2% otherwise everything looked good Patient Active Problem List   Diagnosis Date Noted   OSA on CPAP 02/03/2022   Left shoulder pain 12/22/2017   Upper airway cough syndrome 09/14/2017   Pre-diabetes 11/22/2015   Morbid obesity due to excess calories ([place]) 11/21/2015   Ovarian cyst 02/28/2015   Chest pain, atypical 03/08/2014   Heart murmur 03/08/2014   Heart palpitations 03/08/2014   GAD (generalized anxiety disorder) 02/13/2014   Depression    Essential hypertension     Past Medical History:  Diagnosis Date   Allergy     Benign hematuria 12/2009   WORKUP WITH DR. Janice Norrie WAS NEGATIVE   Depression    Family history of adverse reaction to anesthesia    My dad -  slow awaking   Headache    Mirgraine  - years ago   Heart murmur    HTN (hypertension)    Obesity    Pre-diabetes    Ringing in ears, bilateral    Sleep apnea    UTI (urinary tract infection), uncomplicated     Past Surgical History:  Procedure Laterality Date   CHOLECYSTECTOMY  10/2010   CYSTECTOMY     x 2 from chest   KNEE ARTHROSCOPY Right 12/19/2020   Procedure: RIGHT KNEE ARTHROSCOPY WITH DEBRIDEMENT;  Surgeon: Newt Minion, MD;  Location: [facility];  Service: Orthopedics;  Laterality: Right;    Social History   Tobacco Use   Smoking status: Never   Smokeless tobacco: Never   Tobacco comments:    Smokes marijuana every once in a while.  03/10/22  hfb  Vaping Use   Vaping Use: Never used  Substance Use Topics   Alcohol use: Not Currently    Alcohol/week: 0.0 standard drinks of alcohol    Comment: occasionally   Drug use: No    Family History  Problem Relation Age of Onset   Diabetes Mother    Heart disease Mother        HAD TRIPLE BYPASS   Hypertension Mother    Kidney disease Father  Breast cancer Sister    Breast cancer Maternal Grandmother    Cancer Maternal Grandmother        LYMPH NODE CANCER   Cancer Maternal Aunt        COLORECTAL    COPD Brother    Kidney failure Brother    Colon cancer Neg Hx     Allergies  Allergen Reactions   Wellbutrin [Bupropion] Swelling    Throat swelling    Sulfonamide Derivatives Other (See Comments)    Unknown per pt, it's been a long time    Medication list has been reviewed and updated.  Current Outpatient Medications on File Prior to Visit  Medication Sig Dispense Refill   ALPRAZolam (XANAX) 0.5 MG tablet TAKE 1 TABLET BY MOUTH THREE TIMES A DAY AS NEEDED 90 tablet 2   desvenlafaxine (PRISTIQ) 100 MG 24 hr tablet TAKE 1 TABLET BY MOUTH EVERY DAY 90 tablet 3    famotidine (PEPCID) 20 MG tablet Take 20 mg by mouth daily as needed for heartburn or indigestion.     Insulin Pen Needle (PEN NEEDLES) 32G X 4 MM MISC Use one needle daily for injection 100 each 3   Liraglutide -Weight Management (SAXENDA) 18 MG/3ML SOPN INJECT 0.6 MG INTO THE SKIN DAILY. INCREASE DOSE WEEKLY AS DIRECTED TO GOAL DOSE OF 3 MG 9 mL 3   ondansetron (ZOFRAN-ODT) 4 MG disintegrating tablet Take 1 tablet (4 mg total) by mouth every 8 (eight) hours as needed for nausea or vomiting. 20 tablet 1   telmisartan (MICARDIS) 80 MG tablet Take 1 tablet (80 mg total) by mouth daily. 90 tablet 3   No current facility-administered medications on file prior to visit.    Review of Systems:  As per HPI- otherwise negative.   Physical Examination: There were no vitals filed for this visit. There were no vitals filed for this visit. There is no height or weight on file to calculate BMI. Ideal Body Weight:    GEN: no acute distress. HEENT: Atraumatic, Normocephalic.  Ears and Nose: No external deformity. CV: RRR, No M/G/R. No JVD. No thrill. No extra heart sounds. PULM: CTA B, no wheezes, crackles, rhonchi. No retractions. No resp. distress. No accessory muscle use. ABD: S, NT, ND, +BS. No rebound. No HSM. EXTR: No c/c/e PSYCH: Normally interactive. Conversant.    Assessment and Plan: ***  Signed Lamar Blinks, MD

## 2022-10-31 NOTE — Patient Instructions (Incomplete)
It was nice to see you again today Recommend COVID-19 vaccination

## 2022-11-03 ENCOUNTER — Ambulatory Visit: Payer: BC Managed Care – PPO | Admitting: Family Medicine

## 2022-11-06 ENCOUNTER — Encounter: Payer: Self-pay | Admitting: Family Medicine

## 2022-11-06 ENCOUNTER — Other Ambulatory Visit: Payer: Self-pay | Admitting: Family Medicine

## 2022-11-06 DIAGNOSIS — F411 Generalized anxiety disorder: Secondary | ICD-10-CM

## 2022-11-10 DIAGNOSIS — G4733 Obstructive sleep apnea (adult) (pediatric): Secondary | ICD-10-CM | POA: Diagnosis not present

## 2022-11-23 ENCOUNTER — Encounter: Payer: Self-pay | Admitting: Family Medicine

## 2022-11-24 MED ORDER — SCOPOLAMINE 1 MG/3DAYS TD PT72
1.0000 | MEDICATED_PATCH | TRANSDERMAL | 0 refills | Status: DC
Start: 2022-11-24 — End: 2023-01-28

## 2022-11-26 ENCOUNTER — Other Ambulatory Visit: Payer: Self-pay | Admitting: Family Medicine

## 2022-11-26 DIAGNOSIS — I10 Essential (primary) hypertension: Secondary | ICD-10-CM

## 2023-01-05 ENCOUNTER — Other Ambulatory Visit: Payer: Self-pay | Admitting: Family Medicine

## 2023-01-05 ENCOUNTER — Encounter: Payer: Self-pay | Admitting: Family Medicine

## 2023-01-05 DIAGNOSIS — F411 Generalized anxiety disorder: Secondary | ICD-10-CM

## 2023-01-24 NOTE — Progress Notes (Unsigned)
[place] at [facility] [address] [place], [place] [address] [phone]  Date:  01/28/2023   Name:  Ann Walter   DOB:  01/24/1966   MRN:  [id number]  PCP:  Darreld Mclean, MD    Chief Complaint: Weight Loss (Concerns/ questions: refill Alprazolam )   History of Present Illness:  Ann Walter is a 57 y.o. very pleasant female patient who presents with the following:  Pt seen today with concern of desiring to lose weight Last seen by myself in September  History of prediabetes, hypertension, chronic anxiety/depression/dysthymia/irritability and anger, often related to her job.  She notes that she got a new job that she overall does like better  She is taking some xanax to manage stress between her job and her husband  She has an rx for 0.5 up to TID, she is also on pristiq   She did use saxenda for a while but had to stop it due to non coverage  Wt Readings from Last 3 Encounters:  01/28/23 264 lb (119.7 kg)  08/11/22 255 lb 9.6 oz (115.9 kg)  07/07/22 254 lb (115.2 kg)     Lab Results  Component Value Date   HGBA1C 6.2 07/07/2022     Patient Active Problem List   Diagnosis Date Noted   OSA on CPAP 02/03/2022   Left shoulder pain 12/22/2017   Upper airway cough syndrome 09/14/2017   Pre-diabetes 11/22/2015   Morbid obesity due to excess calories 11/21/2015   Ovarian cyst 02/28/2015   Chest pain, atypical 03/08/2014   Heart murmur 03/08/2014   Heart palpitations 03/08/2014   GAD (generalized anxiety disorder) 02/13/2014   Depression    Essential hypertension     Past Medical History:  Diagnosis Date   Allergy    Benign hematuria 12/2009   WORKUP WITH DR. Janice Norrie WAS NEGATIVE   Depression    Family history of adverse reaction to anesthesia    My dad -  slow awaking   Headache    Mirgraine  - years ago   Heart murmur    HTN (hypertension)    Obesity    Pre-diabetes    Ringing in ears,  bilateral    Sleep apnea    UTI (urinary tract infection), uncomplicated     Past Surgical History:  Procedure Laterality Date   CHOLECYSTECTOMY  10/2010   CYSTECTOMY     x 2 from chest   KNEE ARTHROSCOPY Right 12/19/2020   Procedure: RIGHT KNEE ARTHROSCOPY WITH DEBRIDEMENT;  Surgeon: Newt Minion, MD;  Location: [facility];  Service: Orthopedics;  Laterality: Right;    Social History   Tobacco Use   Smoking status: Never   Smokeless tobacco: Never   Tobacco comments:    Smokes marijuana every once in a while.  03/10/22  hfb  Vaping Use   Vaping Use: Never used  Substance Use Topics   Alcohol use: Not Currently    Alcohol/week: 0.0 standard drinks of alcohol    Comment: occasionally   Drug use: No    Family History  Problem Relation Age of Onset   Diabetes Mother    Heart disease Mother        HAD TRIPLE BYPASS   Hypertension Mother    Kidney disease Father    Breast cancer Sister    Breast cancer Maternal Grandmother    Cancer Maternal Grandmother        LYMPH  NODE CANCER   Cancer Maternal Aunt        COLORECTAL    COPD Brother    Kidney failure Brother    Colon cancer Neg Hx     Allergies  Allergen Reactions   Wellbutrin [Bupropion] Swelling    Throat swelling    Sulfonamide Derivatives Other (See Comments)    Unknown per pt, it's been a long time    Medication list has been reviewed and updated.  Current Outpatient Medications on File Prior to Visit  Medication Sig Dispense Refill   famotidine (PEPCID) 20 MG tablet Take 20 mg by mouth daily as needed for heartburn or indigestion.     telmisartan (MICARDIS) 80 MG tablet TAKE 1 TABLET BY MOUTH EVERY DAY 90 tablet 3   No current facility-administered medications on file prior to visit.    Review of Systems:  As per HPI- otherwise negative.   Physical Examination: Vitals:   01/28/23 1546  BP: 126/80  Pulse: 82  Resp: 18  Temp: 97.8 F (36.6 C)  SpO2: 97%   Vitals:   01/28/23 1546  Weight:  264 lb (119.7 kg)  Height: 5' 3.6" (1.615 m)   Body mass index is 45.89 kg/m. Ideal Body Weight: Weight in (lb) to have BMI = 25: 143.5  GEN: no acute distress.  Obese, looks well  HEENT: Atraumatic, Normocephalic.  Ears and Nose: No external deformity. CV: RRR, No M/G/R. No JVD. No thrill. No extra heart sounds. PULM: CTA B, no wheezes, crackles, rhonchi. No retractions. No resp. distress. No accessory muscle use. EXTR: No c/c/e PSYCH: Normally interactive. Conversant.    Assessment and Plan: Essential hypertension - Plan: Basic metabolic panel, CBC  GAD (generalized anxiety disorder) - Plan: ALPRAZolam (XANAX) 0.5 MG tablet, desvenlafaxine (PRISTIQ) 100 MG 24 hr tablet  Depression, unspecified depression type - Plan: desvenlafaxine (PRISTIQ) 100 MG 24 hr tablet  Pre-diabetes - Plan: Hemoglobin A1c  Patient seen today for follow-up.  Blood pressures under good control, check labs as above.  She is using Pristiq which is doing a good job controlling her depression.  She does continue to have some anxiety and anger issues, alprazolam is helpful She does have morbid obesity, a GLP-1 agonist would be helpful but her insurance does not cover these drugs for treatment of obesity.  Will follow-up on her prediabetes today  Signed Lamar Blinks, MD  Addendum 4/4, received labs as below.  Message to patient  Results for orders placed or performed in visit on 01/28/23  Hemoglobin A1c  Result Value Ref Range   Hgb A1c MFr Bld 6.1 4.6 - 6.5 %  Basic metabolic panel  Result Value Ref Range   Sodium 140 135 - 145 mEq/L   Potassium 3.8 3.5 - 5.1 mEq/L   Chloride 104 96 - 112 mEq/L   CO2 27 19 - 32 mEq/L   Glucose, Bld 92 70 - 99 mg/dL   BUN 15 6 - 23 mg/dL   Creatinine, Ser 0.96 0.40 - 1.20 mg/dL   GFR 65.78 >60.00 mL/min   Calcium 9.4 8.4 - 10.5 mg/dL  CBC  Result Value Ref Range   WBC 7.5 4.0 - 10.5 K/uL   RBC 4.23 3.87 - 5.11 Mil/uL   Platelets 254.0 150.0 - 400.0 K/uL    Hemoglobin 12.7 12.0 - 15.0 g/dL   HCT 37.8 36.0 - 46.0 %   MCV 89.4 78.0 - 100.0 fl   MCHC 33.7 30.0 - 36.0 g/dL   RDW 13.7 11.5 -  15.5 %    

## 2023-01-28 ENCOUNTER — Ambulatory Visit: Payer: 59 | Admitting: Family Medicine

## 2023-01-28 VITALS — BP 126/80 | HR 82 | Temp 97.8°F | Resp 18 | Ht 63.6 in | Wt 264.0 lb

## 2023-01-28 DIAGNOSIS — I10 Essential (primary) hypertension: Secondary | ICD-10-CM

## 2023-01-28 DIAGNOSIS — R7303 Prediabetes: Secondary | ICD-10-CM | POA: Diagnosis not present

## 2023-01-28 DIAGNOSIS — F411 Generalized anxiety disorder: Secondary | ICD-10-CM | POA: Diagnosis not present

## 2023-01-28 DIAGNOSIS — F32A Depression, unspecified: Secondary | ICD-10-CM

## 2023-01-28 MED ORDER — ALPRAZOLAM 0.5 MG PO TABS: 0.5000 mg | ORAL_TABLET | Freq: Three times a day (TID) | ORAL | 2 refills | Status: DC | PRN

## 2023-01-28 MED ORDER — DESVENLAFAXINE SUCCINATE ER 100 MG PO TB24: 100.0000 mg | ORAL_TABLET | Freq: Every day | ORAL | 3 refills | Status: DC

## 2023-01-28 NOTE — Patient Instructions (Signed)
It was good to see you again today- I will be in touch with your labs asap  Please see me in about 6 months assuming all is well

## 2023-01-29 ENCOUNTER — Encounter: Payer: Self-pay | Admitting: Family Medicine

## 2023-01-29 LAB — CBC
HCT: 37.8 % (ref 36.0–46.0)
Hemoglobin: 12.7 g/dL (ref 12.0–15.0)
MCHC: 33.7 g/dL (ref 30.0–36.0)
MCV: 89.4 fl (ref 78.0–100.0)
Platelets: 254 10*3/uL (ref 150.0–400.0)
RBC: 4.23 Mil/uL (ref 3.87–5.11)
RDW: 13.7 % (ref 11.5–15.5)
WBC: 7.5 10*3/uL (ref 4.0–10.5)

## 2023-01-29 LAB — BASIC METABOLIC PANEL
BUN: 15 mg/dL (ref 6–23)
CO2: 27 mEq/L (ref 19–32)
Calcium: 9.4 mg/dL (ref 8.4–10.5)
Chloride: 104 mEq/L (ref 96–112)
Creatinine, Ser: 0.96 mg/dL (ref 0.40–1.20)
GFR: 65.78 mL/min (ref >60.00)
Glucose, Bld: 92 mg/dL (ref 70–99)
Potassium: 3.8 mEq/L (ref 3.5–5.1)
Sodium: 140 mEq/L (ref 135–145)

## 2023-01-29 LAB — HEMOGLOBIN A1C: Hgb A1c MFr Bld: 6.1 % (ref 4.6–6.5)

## 2023-03-09 ENCOUNTER — Other Ambulatory Visit: Payer: Self-pay | Admitting: Family Medicine

## 2023-03-09 DIAGNOSIS — Z1231 Encounter for screening mammogram for malignant neoplasm of breast: Secondary | ICD-10-CM

## 2023-03-11 ENCOUNTER — Encounter: Payer: Self-pay | Admitting: Family Medicine

## 2023-03-12 ENCOUNTER — Encounter: Payer: Self-pay | Admitting: Family Medicine

## 2023-03-12 ENCOUNTER — Ambulatory Visit: Payer: 59 | Admitting: Family Medicine

## 2023-03-12 VITALS — BP 136/86 | HR 77 | Temp 98.3°F | Resp 16 | Ht 63.6 in | Wt 265.5 lb

## 2023-03-12 DIAGNOSIS — I6381 Other cerebral infarction due to occlusion or stenosis of small artery: Secondary | ICD-10-CM

## 2023-03-12 DIAGNOSIS — H93A9 Pulsatile tinnitus, unspecified ear: Secondary | ICD-10-CM

## 2023-03-12 DIAGNOSIS — J0110 Acute frontal sinusitis, unspecified: Secondary | ICD-10-CM

## 2023-03-12 NOTE — Progress Notes (Addendum)
[facility] [address], [address] [place], [place] [address] [phone]  Date:  03/12/2023   Name:  Ann Walter   DOB:  1966/08/29   MRN:  [id number]  PCP:  Pearline Cables, MD    Chief Complaint: Tinnitus (Ringing bilaterally "forever" Ann Walter R ear for several days)   History of Present Illness:  Ann Walter is a 57 y.o. very pleasant female patient who presents with the following:  Patient seen today with concern of an issue with her ears Most recent visit with myself about 6 weeks ago for follow-up From our most recent visit: History of prediabetes, hypertension, chronic anxiety/depression/dysthymia/irritability and anger, often related to her job.  She notes that she got a new job that she overall does like better  She is taking some xanax to manage stress between her job and her husband  She has an rx for 0.5 up to TID, she is also on pristiq   She recently contacted me with concern of change of chronic buzzing/ringing in her right ear for 2-3- maybe a little longer- on second thought she says a week She notes more routine constant buzzing for a decade, but she now has pulsatile tinnitus in the right ear also  She notes it all the time Balance is normal  No change in her vision, smell or taste  No pain  She has not noted any major change in her hearing  No other URI sx  No trauma or falls She has not tried any medication as yet  She does note a HA in the front her her head as well No SOB  No neuro symptoms    Patient Active Problem List   Diagnosis Date Noted   OSA on CPAP 02/03/2022   Left shoulder pain 12/22/2017   Upper airway cough syndrome 09/14/2017   Pre-diabetes 11/22/2015   Morbid obesity due to excess calories (HCC) 11/21/2015   Ovarian cyst 02/28/2015   Chest pain, atypical 03/08/2014   Heart murmur 03/08/2014   Heart palpitations 03/08/2014   GAD (generalized anxiety disorder)  02/13/2014   Depression    Essential hypertension     Past Medical History:  Diagnosis Date   Allergy    Benign hematuria 12/2009   WORKUP WITH DR. Brunilda Payor WAS NEGATIVE   Depression    Family history of adverse reaction to anesthesia    My dad -  slow awaking   Headache    Mirgraine  - years ago   Heart murmur    HTN (hypertension)    Obesity    Pre-diabetes    Ringing in ears, bilateral    Sleep apnea    UTI (urinary tract infection), uncomplicated     Past Surgical History:  Procedure Laterality Date   CHOLECYSTECTOMY  10/2010   CYSTECTOMY     x 2 from chest   KNEE ARTHROSCOPY Right 12/19/2020   Procedure: RIGHT KNEE ARTHROSCOPY WITH DEBRIDEMENT;  Surgeon: Nadara Mustard, MD;  Location: [facility] OR;  Service: Orthopedics;  Laterality: Right;    Social History   Tobacco Use   Smoking status: Never   Smokeless tobacco: Never   Tobacco comments:    Smokes marijuana every once in a while.  03/10/22  hfb  Vaping Use   Vaping Use: Never used  Substance Use Topics   Alcohol use: Not Currently    Alcohol/week: 0.0 standard drinks of alcohol    Comment:  occasionally   Drug use: No    Family History  Problem Relation Age of Onset   Diabetes Mother    Heart disease Mother        HAD TRIPLE BYPASS   Hypertension Mother    Kidney disease Father    Breast cancer Sister    Breast cancer Maternal Grandmother    Cancer Maternal Grandmother        LYMPH NODE CANCER   Cancer Maternal Aunt        COLORECTAL    COPD Brother    Kidney failure Brother    Colon cancer Neg Hx     Allergies  Allergen Reactions   Wellbutrin [Bupropion] Swelling    Throat swelling    Sulfonamide Derivatives Other (See Comments)    Unknown per pt, it's been a long time    Medication list has been reviewed and updated.  Current Outpatient Medications on File Prior to Visit  Medication Sig Dispense Refill   ALPRAZolam (XANAX) 0.5 MG tablet Take 1 tablet (0.5 mg total) by mouth 3 (three) times  daily as needed. 90 tablet 2   desvenlafaxine (PRISTIQ) 100 MG 24 hr tablet Take 1 tablet (100 mg total) by mouth daily. 90 tablet 3   famotidine (PEPCID) 20 MG tablet Take 20 mg by mouth daily as needed for heartburn or indigestion.     telmisartan (MICARDIS) 80 MG tablet TAKE 1 TABLET BY MOUTH EVERY DAY 90 tablet 3   No current facility-administered medications on file prior to visit.    Review of Systems:  As per HPI- otherwise negative.   Physical Examination: Vitals:   03/12/23 1532  BP: 136/86  Pulse: 77  Resp: 16  Temp: 98.3 F (36.8 C)  SpO2: 94%   Vitals:   03/12/23 1532  Weight: 265 lb 8 oz (120.4 kg)  Height: 5' 3.6" (1.615 m)   Body mass index is 46.15 kg/m. Ideal Body Weight: Weight in (lb) to have BMI = 25: 143.5  GEN: no acute distress. Obese, looks well   HEENT: Atraumatic, Normocephalic. Bilateral TM and ear canals wnl, oropharynx normal.  PEERL,EOMI.   Ears and Nose: No external deformity. CV: RRR, No M/G/R. No JVD. No thrill. No extra heart sounds. PULM: CTA B, no wheezes, crackles, rhonchi. No retractions. No resp. distress. No accessory muscle use. ABD: S, NT, ND, +BS. No rebound. No HSM. EXTR: No c/c/e PSYCH: Normally interactive. Conversant.  Normal strength, sensation, DTR for limbs.  Normal facial muscle strength and movement.  Negative Romberg  Assessment and Plan: Pulsatile tinnitus - Plan: Basic metabolic panel, MR Brain W Wo Contrast, MR Angiogram Head Wo Contrast, Ambulatory referral to ENT Patient seen today with new onset pulsatile tinnitus over the last week or so.  Ear exam is normal.  No other neurologic symptoms except for an unremarkable headache Explained to patient that pulsatile tinnitus is more concerning as it can indicate a vascular abnormality.  We will go ahead and arrange imaging ASAP Called and spoke with radiology who recommended a brain MRI with and without contrast with internal auditory complex evaluation, along with an  MRA brain without contrast-we were able to schedule these exams for tomorrow Signed Abbe Amsterdam, MD  Received labs 5/18-  Results for orders placed or performed in visit on 03/12/23  Basic metabolic panel  Result Value Ref Range   Sodium 138 135 - 145 mEq/L   Potassium 4.1 3.5 - 5.1 mEq/L   Chloride 103 96 - 112  mEq/L   CO2 26 19 - 32 mEq/L   Glucose, Bld 90 70 - 99 mg/dL   BUN 17 6 - 23 mg/dL   Creatinine, Ser 0.98 0.40 - 1.20 mg/dL   GFR 11.91 >47.82 mL/min   Calcium 9.4 8.4 - 10.5 mg/dL   Addendum 9/56.  Received MRI results as below.  Results sent to patient via MyChart and also called her regarding sinusitis Advised her that we will call in amoxicillin to treat sinus infection.  I offered to have her see neurology to discuss evidence of prior stroke and she would like to move forward with this as well  MR Angiogram Head Wo Contrast  Result Date: 03/13/2023 CLINICAL DATA:  Initial evaluation for headache, pulsatile tinnitus. EXAM: MRI HEAD WITHOUT AND WITH CONTRAST MRA HEAD WITHOUT CONTRAST TECHNIQUE: Multiplanar, multi-echo pulse sequences of the brain and surrounding structures were acquired without and with intravenous contrast. Angiographic images of the Circle of Willis were acquired using MRA technique without intravenous contrast. CONTRAST:  10mL GADAVIST GADOBUTROL 1 MMOL/ML IV SOLN COMPARISON:  Comparison made with prior CT from 07/01/2007. FINDINGS: MRI HEAD FINDINGS Brain: Cerebral volume within normal limits for age. Mild hazy and patchy T2/FLAIR hyperintensity involving the periventricular white matter, most characteristic of chronic microvascular ischemic disease, mild for age. Superimposed small remote lacunar infarct noted at the left periatrial white matter (series 9, image 25). No evidence for acute or subacute ischemia. Gray-white matter differentiation otherwise maintained. No areas of chronic cortical infarction. No acute or chronic intracranial blood products. No  mass lesion, midline shift or mass effect no hydrocephalus or extra-axial fluid collection. Pituitary gland it suprasellar region within normal limits. No abnormal enhancement. Vascular: Major intracranial vascular flow voids are maintained. Skull and upper cervical spine: Craniocervical junction within normal limits. Bone marrow signal intensity normal. Atlantooccipital assimilation noted. No scalp soft tissue abnormality. Sinuses/Orbits: Globes and orbital soft tissues within normal limits. Small air-fluid level noted within the right maxillary sinus, consistent with acute sinusitis. No mastoid effusion. Other: None. MRA HEAD FINDINGS Anterior circulation: Both internal carotid arteries are patent to the termini without stenosis or other abnormality. A1 segments patent bilaterally. Normal anterior communicating complex. Anterior cerebral arteries widely patent without stenosis. No M1 stenosis or occlusion. No proximal MCA branch occlusion or high-grade stenosis. Distal MCA branches perfused and symmetric. Posterior circulation: Both V4 segments patent without stenosis. Left vertebral artery slightly dominant. Both PICA patent at their origins. Basilar mildly diminutive but patent without stenosis. Superior cerebral arteries patent bilaterally. Right PCA supplied via the basilar as well as a robust right posterior communicating artery. Fetal type origin of the left PCA. Both PCAs patent to their distal aspects without stenosis. Anatomic variants: As above.  No intracranial aneurysm. IMPRESSION: MRI HEAD IMPRESSION: 1. No acute intracranial abnormality. 2. Mild chronic microvascular ischemic disease for age with superimposed small remote lacunar infarct involving the left periatrial white matter. 3. Mild acute sinusitis involving the right maxillary sinus. MRA HEAD IMPRESSION: Normal intracranial MRA. No large vessel occlusion, hemodynamically significant stenosis, or other acute vascular abnormality. No aneurysm.  Electronically Signed   By: Rise Mu M.D.   On: 03/13/2023 20:56   MR Brain W Wo Contrast  Result Date: 03/13/2023 CLINICAL DATA:  Initial evaluation for headache, pulsatile tinnitus. EXAM: MRI HEAD WITHOUT AND WITH CONTRAST MRA HEAD WITHOUT CONTRAST TECHNIQUE: Multiplanar, multi-echo pulse sequences of the brain and surrounding structures were acquired without and with intravenous contrast. Angiographic images of the Circle of Willis  were acquired using MRA technique without intravenous contrast. CONTRAST:  10mL GADAVIST GADOBUTROL 1 MMOL/ML IV SOLN COMPARISON:  Comparison made with prior CT from 07/01/2007. FINDINGS: MRI HEAD FINDINGS Brain: Cerebral volume within normal limits for age. Mild hazy and patchy T2/FLAIR hyperintensity involving the periventricular white matter, most characteristic of chronic microvascular ischemic disease, mild for age. Superimposed small remote lacunar infarct noted at the left periatrial white matter (series 9, image 25). No evidence for acute or subacute ischemia. Gray-white matter differentiation otherwise maintained. No areas of chronic cortical infarction. No acute or chronic intracranial blood products. No mass lesion, midline shift or mass effect no hydrocephalus or extra-axial fluid collection. Pituitary gland it suprasellar region within normal limits. No abnormal enhancement. Vascular: Major intracranial vascular flow voids are maintained. Skull and upper cervical spine: Craniocervical junction within normal limits. Bone marrow signal intensity normal. Atlantooccipital assimilation noted. No scalp soft tissue abnormality. Sinuses/Orbits: Globes and orbital soft tissues within normal limits. Small air-fluid level noted within the right maxillary sinus, consistent with acute sinusitis. No mastoid effusion. Other: None. MRA HEAD FINDINGS Anterior circulation: Both internal carotid arteries are patent to the termini without stenosis or other abnormality. A1  segments patent bilaterally. Normal anterior communicating complex. Anterior cerebral arteries widely patent without stenosis. No M1 stenosis or occlusion. No proximal MCA branch occlusion or high-grade stenosis. Distal MCA branches perfused and symmetric. Posterior circulation: Both V4 segments patent without stenosis. Left vertebral artery slightly dominant. Both PICA patent at their origins. Basilar mildly diminutive but patent without stenosis. Superior cerebral arteries patent bilaterally. Right PCA supplied via the basilar as well as a robust right posterior communicating artery. Fetal type origin of the left PCA. Both PCAs patent to their distal aspects without stenosis. Anatomic variants: As above.  No intracranial aneurysm. IMPRESSION: MRI HEAD IMPRESSION: 1. No acute intracranial abnormality. 2. Mild chronic microvascular ischemic disease for age with superimposed small remote lacunar infarct involving the left periatrial white matter. 3. Mild acute sinusitis involving the right maxillary sinus. MRA HEAD IMPRESSION: Normal intracranial MRA. No large vessel occlusion, hemodynamically significant stenosis, or other acute vascular abnormality. No aneurysm. Electronically Signed   By: Rise Mu M.D.   On: 03/13/2023 20:56

## 2023-03-12 NOTE — Patient Instructions (Signed)
Please let me know if anything is changing or getting worse as far as your symptoms.  I will work on getting imaging set up for your pulsatile tinnitus, will also get you in with ENT as soon as we can

## 2023-03-13 ENCOUNTER — Encounter: Payer: Self-pay | Admitting: Family Medicine

## 2023-03-13 ENCOUNTER — Ambulatory Visit (HOSPITAL_COMMUNITY)
Admission: RE | Admit: 2023-03-13 | Discharge: 2023-03-13 | Disposition: A | Discharge status: Home / Self Care | Payer: 59 | Source: Ambulatory Visit | Attending: Family Medicine | Admitting: Family Medicine

## 2023-03-13 DIAGNOSIS — H93A9 Pulsatile tinnitus, unspecified ear: Secondary | ICD-10-CM

## 2023-03-13 LAB — BASIC METABOLIC PANEL
BUN: 17 mg/dL (ref 6–23)
CO2: 26 mEq/L (ref 19–32)
Calcium: 9.4 mg/dL (ref 8.4–10.5)
Chloride: 103 mEq/L (ref 96–112)
Creatinine, Ser: 0.88 mg/dL (ref 0.40–1.20)
GFR: 72.96 mL/min (ref >60.00)
Glucose, Bld: 90 mg/dL (ref 70–99)
Potassium: 4.1 mEq/L (ref 3.5–5.1)
Sodium: 138 mEq/L (ref 135–145)

## 2023-03-13 MED ORDER — GADOBUTROL 1 MMOL/ML IV SOLN
10.0000 mL | Freq: Once | INTRAVENOUS | Status: AC | PRN
Start: 2023-03-13 — End: 2023-03-13
  Administered 2023-03-13: 10 mL via INTRAVENOUS

## 2023-03-14 ENCOUNTER — Other Ambulatory Visit: Payer: Self-pay | Admitting: Family Medicine

## 2023-03-14 ENCOUNTER — Encounter: Payer: Self-pay | Admitting: Family Medicine

## 2023-03-14 MED ORDER — AMOXICILLIN 500 MG PO CAPS: 1000.0000 mg | ORAL_CAPSULE | Freq: Two times a day (BID) | ORAL | 0 refills | Status: DC

## 2023-03-14 NOTE — Addendum Note (Signed)
Addended by: Abbe Amsterdam C on: 03/14/2023 02:48 PM   Modules accepted: Orders

## 2023-03-16 NOTE — Progress Notes (Signed)
Initial neurology clinic note  [name] MRN: [id number] DOB: 1966-09-15  Referring provider: Pearline Cables, MD  Primary care provider: Pearline Cables, MD  Reason for consult:  abnormal MRI, tinnitus  Subjective:  This is [name], a 57 y.o. right-handed female with a medical history of HTN, pre-diabetes, OSA, anxiety, depression who presents to neurology clinic with tinnitus in the setting of possible lacunar infarct seen on MRI. The patient is alone today.  Patient has had tinnitus for about a decade. She thinks this started after falling and hitting her head. Patient has seen audiology in the past who offered hearing aids, but this was too expensive. Over the last few weeks, patient has had pulsatile tinnitus in right ear. Patient was seen by PCP who ordered MRI and MRA. MRI brain mentioned a small remote lacunar infarct in left periatrial white matter, so patient was referred to neurology.   Patient is currently on amoxcillin for sinus infection.  Patient denies previous episodes of numbness, weakness, or clear aphasia. She does endorse occasional word finding difficulty that is intermittent.   Patient has OSA and on CPAP.  Patient is a never smoker.  MEDICATIONS:  Outpatient Encounter Medications as of 03/18/2023  Medication Sig   ALPRAZolam (XANAX) 0.5 MG tablet Take 1 tablet (0.5 mg total) by mouth 3 (three) times daily as needed.   amoxicillin (AMOXIL) 500 MG capsule Take 2 capsules (1,000 mg total) by mouth 2 (two) times daily.   desvenlafaxine (PRISTIQ) 100 MG 24 hr tablet Take 1 tablet (100 mg total) by mouth daily.   famotidine (PEPCID) 20 MG tablet Take 20 mg by mouth daily as needed for heartburn or indigestion.   telmisartan (MICARDIS) 80 MG tablet TAKE 1 TABLET BY MOUTH EVERY DAY   No facility-administered encounter medications on file as of 03/18/2023.    PAST MEDICAL HISTORY: Past Medical History:  Diagnosis Date   Allergy    Benign  hematuria 12/2009   WORKUP WITH DR. Brunilda Walter WAS NEGATIVE   Depression    Family history of adverse reaction to anesthesia    My dad -  slow awaking   Headache    Mirgraine  - years ago   Heart murmur    HTN (hypertension)    Obesity    Pre-diabetes    Ringing in ears, bilateral    Sleep apnea    UTI (urinary tract infection), uncomplicated     PAST SURGICAL HISTORY: Past Surgical History:  Procedure Laterality Date   CHOLECYSTECTOMY  10/2010   CYSTECTOMY     x 2 from chest   KNEE ARTHROSCOPY Right 12/19/2020   Procedure: RIGHT KNEE ARTHROSCOPY WITH DEBRIDEMENT;  Surgeon: Ann Mustard, MD;  Location: [facility] OR;  Service: Orthopedics;  Laterality: Right;    ALLERGIES: Allergies  Allergen Reactions   Wellbutrin [Bupropion] Swelling    Throat swelling    Sulfonamide Derivatives Other (See Comments)    Unknown per pt, it's been a long time    FAMILY HISTORY: Family History  Problem Relation Age of Onset   Diabetes Mother    Heart disease Mother        HAD TRIPLE BYPASS   Hypertension Mother    Kidney disease Father    Breast cancer Sister    Breast cancer Maternal Grandmother    Cancer Maternal Grandmother        LYMPH NODE CANCER   Cancer Maternal Aunt        COLORECTAL  COPD Brother    Kidney failure Brother    Colon cancer Neg Hx     SOCIAL HISTORY: Social History   Tobacco Use   Smoking status: Never   Smokeless tobacco: Never   Tobacco comments:    Smokes marijuana every once in a while.  03/10/22  hfb  Vaping Use   Vaping Use: Never used  Substance Use Topics   Alcohol use: Not Currently    Alcohol/week: 0.0 standard drinks of alcohol    Comment: occasionally   Drug use: No   Social History   Social History Narrative   Married with no children   She worked with [employer] [place] for 15 years      Are you right handed or left handed?  Left handed   Are you currently employed ? 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 no   Who lives with you? husband   What type of home do you live in: 1 story or 2 story? 2 story but they are almost always downstairs        Objective:  Vital Signs:  BP (!) 143/73   Pulse 87   Resp 16   Ht 5' 3.5" (1.613 m)   Wt 268 lb 9.6 oz (121.8 kg)   LMP  (LMP Unknown) Comment: pt states she is not have periods  SpO2 100%   BMI 46.83 kg/m   General: No acute distress.  Patient appears well-groomed.   Head:  Normocephalic/atraumatic Neck: supple Cardiac: Regular, no carotid bruits Lungs: Non-labored breathing on room air  Affect: Tearful throughout appointment  Neurological Exam: Mental status: alert and oriented, speech fluent and not dysarthric, language intact. Able to name and repeat.  Cranial nerves: CN I: not tested CN II: pupils equal, round and reactive to light, visual fields intact CN III, IV, VI:  full range of motion, no nystagmus, no ptosis CN V: facial sensation intact. CN VII: upper and lower face symmetric CN VIII: hearing intact CN IX, X: uvula midline CN XI: sternocleidomastoid and trapezius muscles intact CN XII: tongue midline  Bulk & Tone: normal, no fasciculations. Motor:  muscle strength 5/5 throughout Deep Tendon Reflexes:  2+ throughout.   Sensation:  Light touch sensation intact. No neglect. Finger to nose testing:  Without dysmetria.   Gait:  Normal station and stride.   Labs and Imaging review: Internal labs: Lab Results  Component Value Date   HGBA1C 6.1 01/28/2023   No results found for: "VITAMINB12" Lab Results  Component Value Date   TSH 0.78 07/07/2022   No results found for: "ESRSEDRATE", "POCTSEDRATE"  CBC (01/28/23): wnl BMP (03/12/23): wnl  FSH (07/07/22): wnl Vit D (07/07/22): wnl  Lipid panel: Component     Latest Ref Rng 07/07/2022  Cholesterol     0 - 200 mg/dL 433   Triglycerides     0.0 - 149.0 mg/dL 29.5   HDL Cholesterol     >39.00 mg/dL 18.84   VLDL     0.0 - 40.0 mg/dL 16.6   LDL  (calc)     0 - 99 mg/dL 58   Total CHOL/HDL Ratio 2   NonHDL 69.50    Imaging: MRI/MRA brain w/wo contrast (03/13/23): FINDINGS: MRI HEAD FINDINGS   Brain: Cerebral volume within normal limits for age. Mild hazy and patchy T2/FLAIR hyperintensity involving the periventricular white matter, most characteristic of chronic microvascular ischemic disease, mild for age. Superimposed small  remote lacunar infarct noted at the left periatrial white matter (series 9, image 25).   No evidence for acute or subacute ischemia. Gray-white matter differentiation otherwise maintained. No areas of chronic cortical infarction. No acute or chronic intracranial blood products.   No mass lesion, midline shift or mass effect no hydrocephalus or extra-axial fluid collection. Pituitary gland it suprasellar region within normal limits.   No abnormal enhancement.   Vascular: Major intracranial vascular flow voids are maintained.   Skull and upper cervical spine: Craniocervical junction within normal limits. Bone marrow signal intensity normal. Atlantooccipital assimilation noted. No scalp soft tissue abnormality.   Sinuses/Orbits: Globes and orbital soft tissues within normal limits. Small air-fluid level noted within the right maxillary sinus, consistent with acute sinusitis. No mastoid effusion.   Other: None.   MRA HEAD FINDINGS   Anterior circulation: Both internal carotid arteries are patent to the termini without stenosis or other abnormality. A1 segments patent bilaterally. Normal anterior communicating complex. Anterior cerebral arteries widely patent without stenosis. No M1 stenosis or occlusion. No proximal MCA branch occlusion or high-grade stenosis. Distal MCA branches perfused and symmetric.   Posterior circulation: Both V4 segments patent without stenosis. Left vertebral artery slightly dominant. Both PICA patent at their origins. Basilar mildly diminutive but patent without  stenosis. Superior cerebral arteries patent bilaterally. Right PCA supplied via the basilar as well as a robust right posterior communicating artery. Fetal type origin of the left PCA. Both PCAs patent to their distal aspects without stenosis.   Anatomic variants: As above.  No intracranial aneurysm.   IMPRESSION: MRI HEAD IMPRESSION:   1. No acute intracranial abnormality. 2. Mild chronic microvascular ischemic disease for age with superimposed small remote lacunar infarct involving the left periatrial white matter. 3. Mild acute sinusitis involving the right maxillary sinus.   MRA HEAD IMPRESSION:   Normal intracranial MRA. No large vessel occlusion, hemodynamically significant stenosis, or other acute vascular abnormality. No aneurysm.  CT head wo contrast (07/01/2007): Findings:  No extra-axial fluid collections or intraparenchymal hemorrhage. No  evidence of midline shift, mass, or mass-effect. Ventricles are normal volume.  Basilar cisterns are patent. Normal gray-white differentiation. Paranasal  sinuses and mastoid air cells are clear. Normal orbits.    IMPRESSION  1. No evidence of acute intracranial process.   Assessment/Plan:  Ann Walter is a 57 y.o. female who presents for evaluation of tinnitus and abnormal MRI brain. She has a relevant medical history of HTN, pre-diabetes, OSA, anxiety, depression. Her neurological examination is essentially normal today. Available diagnostic data is significant for MRI brain showing small remote lacunar infarct in left white matter. Her lipid panel and TSH are normal. HbA1c was in pre-diabetes range (6.1). The etiology of patient's tinnitus would not be due to this lacunar infarct. When this insult causing the change seen on MRI brain is unclear. It was not recent. Further she does not have other significant small vessel disease. She does have stroke risk factors that include HTN, pre-diabetes, and OSA.   Of note, patient was very  tearful during appointment today mentioning sadness about not having family or friends and no one to talk to. She denied suicidal ideation. We discussed depression and the importance of proper treatment, including the possible need for psychology or psychiatry. She will speak with her PCP about this.  PLAN: -Discussed risks and benefits of aspirin 81 mg daily. Patient preferred to defer, which is reasonable. -Discussed stroke warning signs -Continue CPAP for OSA -Continue to work with  PCP on optimal HTN and pre-diabetes control. LDL is at target for those with a possible previous stroke (LDL < 70). -Encouraged patient to discuss depression symptoms with her PCP. Patient also gave her permission for me to message PCP about this.  -Return to clinic as needed.  The impression above as well as the plan as outlined below were extensively discussed with the patient who voiced understanding. All questions were answered to their satisfaction.  When available, results of the above investigations and possible further recommendations will be communicated to the patient via telephone/MyChart. Patient to call office if not contacted after expected testing turnaround time.   Total time spent reviewing records, interview, history/exam, documentation, and coordination of care on day of encounter:  55 min   Thank you for allowing me to participate in patient's care.  If I can answer any additional questions, I would be pleased to do so.  Ann Balint, MD   CC: Copland, Gwenlyn Found, MD [address] [place] [address]  CC: Referring provider: Pearline Cables, MD [address] [place],  [place] [address]

## 2023-03-18 ENCOUNTER — Encounter: Payer: Self-pay | Admitting: Neurology

## 2023-03-18 ENCOUNTER — Ambulatory Visit: Payer: 59 | Admitting: Neurology

## 2023-03-18 ENCOUNTER — Encounter: Payer: Self-pay | Admitting: Family Medicine

## 2023-03-18 VITALS — BP 143/73 | HR 87 | Resp 16 | Ht 63.5 in | Wt 268.6 lb

## 2023-03-18 DIAGNOSIS — R930 Abnormal findings on diagnostic imaging of skull and head, not elsewhere classified: Secondary | ICD-10-CM

## 2023-03-18 DIAGNOSIS — F341 Dysthymic disorder: Secondary | ICD-10-CM | POA: Diagnosis not present

## 2023-03-18 DIAGNOSIS — H9313 Tinnitus, bilateral: Secondary | ICD-10-CM

## 2023-03-18 NOTE — Patient Instructions (Signed)
I saw you today for the MRI brain that was abnormal. These findings would not be related to your ringing in your ears. It does not appear this spot would be causing symptoms at all.  If you have new difficulty speaking, face droop, numbness on one side of the body, weakness on one side of the body, or dizziness/imbalance, this could be the sign of a stroke. Don't wait, please call EMS and be evaluated at the nearest emergency room.  I suggest you mention to your primary care about your feelings and depression. You may benefit from psychology or psychiatry.   The physicians and staff at [facility] are committed to providing excellent care. You may receive a survey requesting feedback about your experience at our office. We strive to receive "very good" responses to the survey questions. If you feel that your experience would prevent you from giving the office a "very good " response, please contact our office to try to remedy the situation. We may be reached at [phone]. Thank you for taking the time out of your busy day to complete the survey.  Jacquelyne Balint, MD [facility]

## 2023-03-24 NOTE — Progress Notes (Signed)
[facility] [address] [place], [place] [address] [phone]  Date:  03/25/2023   Name:  Ann Walter   DOB:  [birth year]   MRN:  [id number]  PCP:  Pearline Cables, MD    Chief Complaint: medication follow up   History of Present Illness:  Ann Walter is a 57 y.o. very pleasant female patient who presents with the following:  Patient seen today to discuss medication Virtual visit, patient location is home and my location is office.  Patient identity confirmed with 2 factors, she gives consent for virtual visit today.  The patient and myself are present on the visit today I saw Ann Walter in the office in mid May, at which time she had complaint of pulsatile tinnitus.  I was concerned and got an MRI which did display a lacunar infarct, she was seen by Dr. Loleta Chance with neurology on May 22: PLAN: -Discussed risks and benefits of aspirin 81 mg daily. Patient preferred to defer, which is reasonable. -Discussed stroke warning signs -Continue CPAP for OSA -Continue to work with PCP on optimal HTN and pre-diabetes control. LDL is at target for those with a possible previous stroke (LDL < 70). -Encouraged patient to discuss depression symptoms with her PCP. Patient also gave her permission for me to message PCP about this.  Dr. Loleta Chance was concerned about her tearfulness and expression of having no family or friends.  I got in touch with Ann Walter and we scheduled this appointment She is currently taking Pristiq 100 mg Looking back, we have previously tried Wellbutrin, Lexapro, Prozac  Connected with pt via mychart video-  She notes she is more stressed since she has been "fussing more with my husband" which makes her sx worse She does not necessarily want to change medications right now, she feels like she is about her baseline and that she is actually doing better than when she saw neurology. Ann Walter tends to have a lot of  depression and anger sx which are typical for her I offered to have her see a counselor or psychiatrist She notes money is tight right now and is not sure if she could see another provider I encouraged her to check into any free or low cost counseling services which may be available   Ann Walter also asked about a GLP-1 drug for weight loss.  I advised because she does not have diabetes that is unlikely to be covered.  She wonders about other options, I mentioned that she might look at bariatric surgery She is also interested in looking into weight loss surgery-I will send her the bariatric services website so she can sign up for the informational seminar  Wt Readings from Last 3 Encounters:  03/18/23 268 lb 9.6 oz (121.8 kg)  03/12/23 265 lb 8 oz (120.4 kg)  01/28/23 264 lb (119.7 kg)      Patient Active Problem List   Diagnosis Date Noted   OSA on CPAP 02/03/2022   Left shoulder pain 12/22/2017   Upper airway cough syndrome 09/14/2017   Pre-diabetes 11/22/2015   Morbid obesity due to excess calories (HCC) 11/21/2015   Ovarian cyst 02/28/2015   Chest pain, atypical 03/08/2014   Heart murmur 03/08/2014   Heart palpitations 03/08/2014   GAD (generalized anxiety disorder) 02/13/2014   Depression    Essential hypertension     Past Medical History:  Diagnosis Date   Allergy    Benign hematuria 12/2009  WORKUP WITH DR. Brunilda Payor WAS NEGATIVE   Depression    Family history of adverse reaction to anesthesia    My dad -  slow awaking   Headache    Mirgraine  - years ago   Heart murmur    HTN (hypertension)    Obesity    Pre-diabetes    Ringing in ears, bilateral    Sleep apnea    UTI (urinary tract infection), uncomplicated     Past Surgical History:  Procedure Laterality Date   CHOLECYSTECTOMY  10/2010   CYSTECTOMY     x 2 from chest   KNEE ARTHROSCOPY Right 12/19/2020   Procedure: RIGHT KNEE ARTHROSCOPY WITH DEBRIDEMENT;  Surgeon: Nadara Mustard, MD;  Location: [facility] OR;   Service: Orthopedics;  Laterality: Right;    Social History   Tobacco Use   Smoking status: Never   Smokeless tobacco: Never   Tobacco comments:    Smokes marijuana every once in a while.  03/10/22  hfb  Vaping Use   Vaping Use: Never used  Substance Use Topics   Alcohol use: Not Currently    Alcohol/week: 0.0 standard drinks of alcohol    Comment: occasionally   Drug use: No    Family History  Problem Relation Age of Onset   Diabetes Mother    Heart disease Mother        HAD TRIPLE BYPASS   Hypertension Mother    Kidney disease Father    Breast cancer Sister    Breast cancer Maternal Grandmother    Cancer Maternal Grandmother        LYMPH NODE CANCER   Cancer Maternal Aunt        COLORECTAL    COPD Brother    Kidney failure Brother    Colon cancer Neg Hx     Allergies  Allergen Reactions   Wellbutrin [Bupropion] Swelling    Throat swelling    Sulfonamide Derivatives Other (See Comments)    Unknown per pt, it's been a long time    Medication list has been reviewed and updated.  Current Outpatient Medications on File Prior to Visit  Medication Sig Dispense Refill   ALPRAZolam (XANAX) 0.5 MG tablet Take 1 tablet (0.5 mg total) by mouth 3 (three) times daily as needed. 90 tablet 2   amoxicillin (AMOXIL) 500 MG capsule Take 2 capsules (1,000 mg total) by mouth 2 (two) times daily. 40 capsule 0   desvenlafaxine (PRISTIQ) 100 MG 24 hr tablet Take 1 tablet (100 mg total) by mouth daily. 90 tablet 3   famotidine (PEPCID) 20 MG tablet Take 20 mg by mouth daily as needed for heartburn or indigestion.     telmisartan (MICARDIS) 80 MG tablet TAKE 1 TABLET BY MOUTH EVERY DAY 90 tablet 3   No current facility-administered medications on file prior to visit.    Review of Systems:  As per HPI- otherwise negative.   Physical Examination: There were no vitals filed for this visit. There were no vitals filed for this visit. There is no height or weight on file to  calculate BMI. Ideal Body Weight:    Patient is there via MyChart video.  She looks well, her normal self  Assessment and Plan: GAD (generalized anxiety disorder)  Depression, unspecified depression type  Morbid obesity (HCC) MyChart visit to discuss concerns as above.  Patient is currently taking Pristiq, she states she is actually happy with this medication and does not wish to make a change right now.  She denies any suicidal ideation.  I did encourage her to look into counseling for her chronic anxiety, depression, anger symptoms  She will also potentially look into weight loss surgery as a permanent treatment for obesity  Signed Abbe Amsterdam, MD

## 2023-03-25 ENCOUNTER — Encounter: Payer: Self-pay | Admitting: Family Medicine

## 2023-03-25 ENCOUNTER — Telehealth (INDEPENDENT_AMBULATORY_CARE_PROVIDER_SITE_OTHER): Payer: 59 | Admitting: Family Medicine

## 2023-03-25 DIAGNOSIS — F32A Depression, unspecified: Secondary | ICD-10-CM

## 2023-03-25 DIAGNOSIS — F411 Generalized anxiety disorder: Secondary | ICD-10-CM | POA: Diagnosis not present

## 2023-04-03 ENCOUNTER — Ambulatory Visit
Admission: RE | Admit: 2023-04-03 | Discharge: 2023-04-03 | Disposition: A | Discharge status: Home / Self Care | Payer: 59 | Source: Ambulatory Visit

## 2023-04-03 DIAGNOSIS — Z1231 Encounter for screening mammogram for malignant neoplasm of breast: Secondary | ICD-10-CM

## 2023-04-06 ENCOUNTER — Encounter (INDEPENDENT_AMBULATORY_CARE_PROVIDER_SITE_OTHER): Payer: 59 | Admitting: Family Medicine

## 2023-04-06 DIAGNOSIS — F32A Depression, unspecified: Secondary | ICD-10-CM

## 2023-04-06 DIAGNOSIS — F411 Generalized anxiety disorder: Secondary | ICD-10-CM

## 2023-04-06 MED ORDER — VENLAFAXINE HCL ER 75 MG PO CP24: 75.0000 mg | ORAL_CAPSULE | Freq: Every day | ORAL | 3 refills | Status: DC

## 2023-04-06 NOTE — Telephone Encounter (Signed)
Per OV note 03/25/23  "Patient is currently taking Pristiq, she states she is actually happy with this medication and does not wish to make a change right now. She denies any suicidal ideation. I did encourage her to look into counseling for her chronic anxiety, depression, anger symptoms. "  Please advice.

## 2023-04-06 NOTE — Telephone Encounter (Signed)
Please see the MyChart message reply(ies) for my assessment and plan.  The patient gave consent for this Medical Advice Message and is aware that it may result in a bill to their insurance company as well as the possibility that this may result in a co-payment or deductible. They are an established patient, but are not seeking medical advice exclusively about a problem treated during an in person or video visit in the last 7 days. I did not recommend an in person or video visit within 7 days of my reply.  I spent a total of 10 minutes cumulative time within 7 days through MyChart messaging Kellyanne Ellwanger, MD  

## 2023-04-06 NOTE — Addendum Note (Signed)
Addended by: Abbe Amsterdam C on: 04/06/2023 04:43 PM   Modules accepted: Orders

## 2023-04-28 ENCOUNTER — Other Ambulatory Visit: Payer: Self-pay | Admitting: Family Medicine

## 2023-04-28 DIAGNOSIS — F411 Generalized anxiety disorder: Secondary | ICD-10-CM

## 2023-04-28 DIAGNOSIS — F32A Depression, unspecified: Secondary | ICD-10-CM

## 2023-05-02 ENCOUNTER — Other Ambulatory Visit: Payer: Self-pay | Admitting: Family Medicine

## 2023-05-02 DIAGNOSIS — F411 Generalized anxiety disorder: Secondary | ICD-10-CM

## 2023-05-14 ENCOUNTER — Encounter: Payer: Self-pay | Admitting: Family Medicine

## 2023-05-16 NOTE — Progress Notes (Unsigned)
[facility] [address] [place], [place] [address] [phone]  Date:  05/21/2023   Name:  Ann Walter   DOB:  [birth year]   MRN:  [id number]  PCP:  Pearline Cables, MD    Chief Complaint: RLQ pain (Pt says she has no gallbladder.  )   History of Present Illness:  Ann Walter is a 57 y.o. very pleasant female patient who presents with the following:  Seen today with concern of abdominal pain Most recent visit with myself was a virtual visit at the end of May She recently contacted me via MyChart as follows: I am getting that same pain again ( the one they said was from my gallbladder) it is swelled up a little. It is right below my breast on the right side. It started yesterday. I was breathing hard when I walked from the parking deck into work but right now I'm at another location and the walk is not as far and breathing not as bad.   She had right upper abdomen pain that started about a week ago It was severe for 2 days and then eased off but has not totally resolved This reminded her of when her gallbladder was removed about 12 years ago She was having headaches but these did resolve to a few days ago  Appetite is ok No vomiting No diarrhea No fever No new urinary sx   Also, Angelique Blonder mentions substernal CP and SOB with exertion for maybe a week, it is hard for her to say for sure  She notes walking from her car into work makes her feel short of breath, previously she can do this without any difficulty She does have the CP now She has not had this in the past She has not had any recent cardiac evaluation  No known history of CAD, but she does have obesity, hypertension, sleep apnea Patient Active Problem List   Diagnosis Date Noted   OSA on CPAP 02/03/2022   Left shoulder pain 12/22/2017   Upper airway cough syndrome 09/14/2017   Pre-diabetes 11/22/2015   Morbid obesity due to excess calories (HCC)  11/21/2015   Ovarian cyst 02/28/2015   Chest pain, atypical 03/08/2014   Heart murmur 03/08/2014   Heart palpitations 03/08/2014   GAD (generalized anxiety disorder) 02/13/2014   Depression    Essential hypertension     Past Medical History:  Diagnosis Date   Allergy    Benign hematuria 12/2009   WORKUP WITH DR. Brunilda Payor WAS NEGATIVE   Depression    Family history of adverse reaction to anesthesia    My dad -  slow awaking   Headache    Mirgraine  - years ago   Heart murmur    HTN (hypertension)    Obesity    Pre-diabetes    Ringing in ears, bilateral    Sleep apnea    UTI (urinary tract infection), uncomplicated     Past Surgical History:  Procedure Laterality Date   CHOLECYSTECTOMY  10/2010   CYSTECTOMY     x 2 from chest   KNEE ARTHROSCOPY Right 12/19/2020   Procedure: RIGHT KNEE ARTHROSCOPY WITH DEBRIDEMENT;  Surgeon: Nadara Mustard, MD;  Location: [facility] OR;  Service: Orthopedics;  Laterality: Right;    Social History   Tobacco Use   Smoking status: Never   Smokeless tobacco: Never   Tobacco comments:    Smokes marijuana every once  in a while.  03/10/22  hfb  Vaping Use   Vaping status: Never Used  Substance Use Topics   Alcohol use: Not Currently    Alcohol/week: 0.0 standard drinks of alcohol    Comment: occasionally   Drug use: No    Family History  Problem Relation Age of Onset   Diabetes Mother    Heart disease Mother        HAD TRIPLE BYPASS   Hypertension Mother    Kidney disease Father    Breast cancer Sister    Breast cancer Maternal Grandmother    Cancer Maternal Grandmother        LYMPH NODE CANCER   Cancer Maternal Aunt        COLORECTAL    COPD Brother    Kidney failure Brother    Colon cancer Neg Hx     Allergies  Allergen Reactions   Wellbutrin [Bupropion] Swelling    Throat swelling    Sulfonamide Derivatives Other (See Comments)    Unknown per pt, it's been a long time    Medication list has been reviewed and  updated.  Current Outpatient Medications on File Prior to Visit  Medication Sig Dispense Refill   ALPRAZolam (XANAX) 0.5 MG tablet TAKE 1 TABLET BY MOUTH 3 TIMES DAILY AS NEEDED. 90 tablet 2   famotidine (PEPCID) 20 MG tablet Take 20 mg by mouth daily as needed for heartburn or indigestion.     telmisartan (MICARDIS) 80 MG tablet TAKE 1 TABLET BY MOUTH EVERY DAY 90 tablet 3   venlafaxine XR (EFFEXOR-XR) 75 MG 24 hr capsule Take 1 capsule (75 mg total) by mouth daily with breakfast. 90 capsule 0   No current facility-administered medications on file prior to visit.    Review of Systems:  As per HPI- otherwise negative.   Physical Examination: Vitals:   05/21/23 1500  BP: 132/80  Pulse: 71  Resp: 18  Temp: 97.6 F (36.4 C)  SpO2: 98%   Vitals:   05/21/23 1500  Weight: 267 lb (121.1 kg)  Height: 5' 3.6" (1.615 m)   Body mass index is 46.41 kg/m. Ideal Body Weight: Weight in (lb) to have BMI = 25: 143.5  GEN: no acute distress.  Obese, looks well  HEENT: Atraumatic, Normocephalic.  Ears and Nose: No external deformity. CV: RRR, No M/G/R. No JVD. No thrill. No extra heart sounds. PULM: CTA B, no wheezes, crackles, rhonchi. No retractions. No resp. distress. No accessory muscle use. ABD: S,ND, +BS. No rebound. No HSM.  Possible mild RUQ tenderness EXTR: No c/c/e PSYCH: Normally interactive. Conversant.   EKG: sinus rhythm, otherwise tracing is hard to read, poor uptake on limb leads Compared with tracing from 2022- chance Assessment and Plan: Chest pain, unspecified type - Plan: EKG 12-Lead Patient came in today to discuss abdominal pain, however on interview she has complaint of shortness of breath with exertion and substernal chest pain for about a week.  Concern of possible unstable angina.  EKG does not suggest an acute MI.  Will have patient seen in the ER now for further evaluation, appreciate ER care of this patient.  Angelique Blonder was escorted to the ER waiting room by  staff   Signed Abbe Amsterdam, MD

## 2023-05-21 ENCOUNTER — Other Ambulatory Visit: Payer: Self-pay

## 2023-05-21 ENCOUNTER — Ambulatory Visit (INDEPENDENT_AMBULATORY_CARE_PROVIDER_SITE_OTHER): Payer: 59 | Admitting: Family Medicine

## 2023-05-21 ENCOUNTER — Encounter (HOSPITAL_BASED_OUTPATIENT_CLINIC_OR_DEPARTMENT_OTHER): Payer: Self-pay

## 2023-05-21 ENCOUNTER — Emergency Department (HOSPITAL_BASED_OUTPATIENT_CLINIC_OR_DEPARTMENT_OTHER): Payer: 59

## 2023-05-21 ENCOUNTER — Emergency Department (HOSPITAL_BASED_OUTPATIENT_CLINIC_OR_DEPARTMENT_OTHER)
Admission: EM | Admit: 2023-05-21 | Discharge: 2023-05-21 | Disposition: A | Discharge status: Home / Self Care | Payer: 59 | Attending: Emergency Medicine | Admitting: Emergency Medicine

## 2023-05-21 VITALS — BP 132/80 | HR 71 | Temp 97.6°F | Resp 18 | Ht 63.6 in | Wt 267.0 lb

## 2023-05-21 DIAGNOSIS — I10 Essential (primary) hypertension: Secondary | ICD-10-CM | POA: Insufficient documentation

## 2023-05-21 DIAGNOSIS — R079 Chest pain, unspecified: Secondary | ICD-10-CM | POA: Diagnosis not present

## 2023-05-21 DIAGNOSIS — R0789 Other chest pain: Secondary | ICD-10-CM | POA: Diagnosis not present

## 2023-05-21 DIAGNOSIS — R1013 Epigastric pain: Secondary | ICD-10-CM | POA: Diagnosis not present

## 2023-05-21 DIAGNOSIS — Z79899 Other long term (current) drug therapy: Secondary | ICD-10-CM | POA: Insufficient documentation

## 2023-05-21 DIAGNOSIS — R109 Unspecified abdominal pain: Secondary | ICD-10-CM | POA: Diagnosis present

## 2023-05-21 LAB — CBC WITH DIFFERENTIAL/PLATELET
Abs Immature Granulocytes: 0.02 10*3/uL (ref 0.00–0.07)
Basophils Absolute: 0 10*3/uL (ref 0.0–0.1)
Basophils Relative: 0 %
Eosinophils Absolute: 0.4 10*3/uL (ref 0.0–0.5)
Eosinophils Relative: 4 %
HCT: 38.9 % (ref 36.0–46.0)
Hemoglobin: 12.8 g/dL (ref 12.0–15.0)
Immature Granulocytes: 0 %
Lymphocytes Relative: 31 %
Lymphs Abs: 2.9 10*3/uL (ref 0.7–4.0)
MCH: 29.3 pg (ref 26.0–34.0)
MCHC: 32.9 g/dL (ref 30.0–36.0)
MCV: 89 fL (ref 80.0–100.0)
Monocytes Absolute: 0.7 10*3/uL (ref 0.1–1.0)
Monocytes Relative: 8 %
Neutro Abs: 5.2 10*3/uL (ref 1.7–7.7)
Neutrophils Relative %: 57 %
Platelets: 245 10*3/uL (ref 150–400)
RBC: 4.37 MIL/uL (ref 3.87–5.11)
RDW: 13.2 % (ref 11.5–15.5)
WBC: 9.2 10*3/uL (ref 4.0–10.5)
nRBC: 0 % (ref 0.0–0.2)

## 2023-05-21 LAB — COMPREHENSIVE METABOLIC PANEL
ALT: 15 U/L (ref 0–44)
AST: 19 U/L (ref 15–41)
Albumin: 4.3 g/dL (ref 3.5–5.0)
Alkaline Phosphatase: 105 U/L (ref 38–126)
Anion gap: 6 (ref 5–15)
BUN: 19 mg/dL (ref 6–20)
CO2: 24 mmol/L (ref 22–32)
Calcium: 9.3 mg/dL (ref 8.9–10.3)
Chloride: 107 mmol/L (ref 98–111)
Creatinine, Ser: 1.08 mg/dL — ABNORMAL HIGH (ref 0.44–1.00)
GFR, Estimated: 60 mL/min — ABNORMAL LOW (ref >60)
Glucose, Bld: 107 mg/dL — ABNORMAL HIGH (ref 70–99)
Potassium: 3.5 mmol/L (ref 3.5–5.1)
Sodium: 137 mmol/L (ref 135–145)
Total Bilirubin: 0.6 mg/dL (ref 0.3–1.2)
Total Protein: 7.3 g/dL (ref 6.5–8.1)

## 2023-05-21 LAB — D-DIMER, QUANTITATIVE: D-Dimer, Quant: 0.77 ug/mL-FEU — ABNORMAL HIGH (ref 0.00–0.50)

## 2023-05-21 LAB — TROPONIN I (HIGH SENSITIVITY)
Troponin I (High Sensitivity): 3 ng/L (ref <18)
Troponin I (High Sensitivity): 4 ng/L (ref <18)

## 2023-05-21 LAB — LIPASE, BLOOD: Lipase: 43 U/L (ref 11–51)

## 2023-05-21 MED ORDER — PANTOPRAZOLE SODIUM 20 MG PO TBEC: 20.0000 mg | DELAYED_RELEASE_TABLET | Freq: Every day | ORAL | 0 refills | Status: DC

## 2023-05-21 MED ORDER — IOHEXOL 350 MG/ML SOLN
100.0000 mL | Freq: Once | INTRAVENOUS | Status: AC | PRN
  Administered 2023-05-21: 100 mL via INTRAVENOUS

## 2023-05-21 MED ORDER — ALUM & MAG HYDROXIDE-SIMETH 200-200-20 MG/5ML PO SUSP
30.0000 mL | Freq: Once | ORAL | Status: AC
  Administered 2023-05-21: 30 mL via ORAL
  Filled 2023-05-21: qty 30

## 2023-05-21 NOTE — ED Provider Notes (Signed)
[place] [facility] Provider Note   CSN: [id number] Arrival date & time: 05/21/23  1549     History  Chief Complaint  Patient presents with   Abdominal Pain   Chest Pain    Ann Walter is a 57 y.o. female.  Patient here with chest pain and abdominal pain.  History of hypertension, cholecystectomy in the past.  She has been having some intermittent pain in the upper abdomen, chest on and off for the last week or so.  Sometimes with exertion sometimes at rest maybe sometimes with eating.  She has had some discomfort since this morning today.  She was sent here by primary care doctor for further cardiac workup.  She denies any recent change in activities.  She denies any fever or chills or cough or sputum production.  Does not sound like she has had any cardiac history in her family at a young age but cardiac history in older age.  She has not had any recent surgery or travel.  No history of blood clots.  She denies any nausea or vomiting.  The history is provided by the patient.       Home Medications Prior to Admission medications   Medication Sig Start Date End Date Taking? Authorizing Provider  pantoprazole (PROTONIX) 20 MG tablet Take 1 tablet (20 mg total) by mouth daily for 14 days. 05/21/23 06/04/23 Yes Zahlia Deshazer, DO  ALPRAZolam (XANAX) 0.5 MG tablet TAKE 1 TABLET BY MOUTH 3 TIMES DAILY AS NEEDED. 05/04/23   Copland, Gwenlyn Found, MD  telmisartan (MICARDIS) 80 MG tablet TAKE 1 TABLET BY MOUTH EVERY DAY 11/27/22   Copland, Gwenlyn Found, MD  venlafaxine XR (EFFEXOR-XR) 75 MG 24 hr capsule Take 1 capsule (75 mg total) by mouth daily with breakfast. 04/28/23   Copland, Gwenlyn Found, MD      Allergies    Wellbutrin [bupropion] and Sulfonamide derivatives    Review of Systems   Review of Systems  Physical Exam Updated Vital Signs BP (!) 155/82   Pulse 69   Temp 97.6 F (36.4 C) (Oral)   Resp 18   Wt 121.1 kg   LMP  (LMP Unknown) Comment: pt  states she is not have periods  SpO2 99%   BMI 46.41 kg/m  Physical Exam Vitals and nursing note reviewed.  Constitutional:      General: She is not in acute distress.    Appearance: She is well-developed. She is not ill-appearing.  HENT:     Head: Normocephalic and atraumatic.     Mouth/Throat:     Mouth: Mucous membranes are moist.  Eyes:     Extraocular Movements: Extraocular movements intact.     Conjunctiva/sclera: Conjunctivae normal.  Cardiovascular:     Rate and Rhythm: Normal rate and regular rhythm.     Heart sounds: Normal heart sounds. No murmur heard. Pulmonary:     Effort: Pulmonary effort is normal. No respiratory distress.     Breath sounds: Normal breath sounds.  Abdominal:     General: Abdomen is flat.     Palpations: Abdomen is soft.     Tenderness: There is no abdominal tenderness. There is no guarding or rebound. Negative signs include Murphy's sign and Rovsing's sign.     Hernia: No hernia is present.  Musculoskeletal:        General: No swelling.     Cervical back: Neck supple.  Skin:    General: Skin is warm and dry.  Capillary Refill: Capillary refill takes less than 2 seconds.  Neurological:     General: No focal deficit present.     Mental Status: She is alert.  Psychiatric:        Mood and Affect: Mood normal.     ED Results / Procedures / Treatments   Labs (all labs ordered are listed, but only abnormal results are displayed) Labs Reviewed  COMPREHENSIVE METABOLIC PANEL - Abnormal; Notable for the following components:      Result Value   Glucose, Bld 107 (*)    Creatinine, Ser 1.08 (*)    GFR, Estimated 60 (*)    All other components within normal limits  D-DIMER, QUANTITATIVE - Abnormal; Notable for the following components:   D-Dimer, Quant 0.77 (*)    All other components within normal limits  CBC WITH DIFFERENTIAL/PLATELET  LIPASE, BLOOD  TROPONIN I (HIGH SENSITIVITY)  TROPONIN I (HIGH SENSITIVITY)    EKG EKG  Interpretation Date/Time:  Thursday May 21 2023 16:00:40 EDT Ventricular Rate:  78 PR Interval:  150 QRS Duration:  100 QT Interval:  394 QTC Calculation: 449 R Axis:   8  Text Interpretation: Sinus rhythm Confirmed by Virgina Norfolk [phone]) on 05/21/2023 4:34:06 PM  Radiology CT Angio Chest PE W and/or Wo Contrast  Result Date: 05/21/2023 CLINICAL DATA:  High probability of pulmonary embolism. Chest pain EXAM: CT ANGIOGRAPHY CHEST WITH CONTRAST TECHNIQUE: Multidetector CT imaging of the chest was performed using the standard protocol during bolus administration of intravenous contrast. Multiplanar CT image reconstructions and MIPs were obtained to evaluate the vascular anatomy. RADIATION DOSE REDUCTION: This exam was performed according to the departmental dose-optimization program which includes automated exposure control, adjustment of the mA and/or kV according to patient size and/or use of iterative reconstruction technique. CONTRAST:  OMNIPAQUE IOHEXOL 350 MG/ML SOLN COMPARISON:  None Available. FINDINGS: Cardiovascular: No filling defects within the pulmonary arteries to suggest acute pulmonary embolism. No significant vascular findings. Normal heart size. No pericardial effusion. Mediastinum/Nodes: No axillary or supraclavicular adenopathy. No mediastinal or hilar adenopathy. No pericardial fluid. Esophagus normal. Lungs/Pleura: No pulmonary infarction. No pneumonia. No pleural fluid. No pneumothorax Upper Abdomen: Limited view of the liver, kidneys, pancreas are unremarkable. Enlargement of the LEFT adrenal gland to 21 mm. This enlarged LEFT adrenal gland has low density Hounsfield units consistent with a benign adrenal adenoma. Musculoskeletal: No acute osseous abnormality. Review of the MIP images confirms the above findings. IMPRESSION: 1. No evidence acute pulmonary embolism. 2. No acute findings in the chest. 3. Benign LEFT adrenal adenoma. No follow-up recommended. Electronically  Signed   By: Genevive Bi M.D.   On: 05/21/2023 18:08   DG Chest Portable 1 View  Result Date: 05/21/2023 CLINICAL DATA:  Chest pain, shortness of breath. EXAM: PORTABLE CHEST 1 VIEW COMPARISON:  June 15, 2022. FINDINGS: The heart size and mediastinal contours are within normal limits. Both lungs are clear. The visualized skeletal structures are unremarkable. IMPRESSION: No active disease. Electronically Signed   By: Lupita Raider M.D.   On: 05/21/2023 16:51    Procedures Procedures    Medications Ordered in ED Medications  alum & mag hydroxide-simeth (MAALOX/MYLANTA) 200-200-20 MG/5ML suspension 30 mL (30 mLs Oral Given 05/21/23 1621)  iohexol (OMNIPAQUE) 350 MG/ML injection 100 mL (100 mLs Intravenous Contrast Given 05/21/23 1732)    ED Course/ Medical Decision Making/ A&P             HEART Score: 2  Medical Decision Making Amount and/or Complexity of Data Reviewed Labs: ordered. Radiology: ordered.  Risk OTC drugs. Prescription drug management.   JENNIFERMARIE FRANZEN is here with chest pain/abdominal pain.  Normal vitals.  No fever.  EKG shows sinus rhythm.  No obvious ischemic changes per my review interpretation.  Heart score is 2.  Does not have any abdominal tenderness.  She has had prior cholecystectomy.  Intermittent right upper quadrant abdominal pain at times.  Seems less likely to be hepatobiliary process or pancreatitis but will evaluate.  Could be ACS or PE or pneumonia or MSK or GI related discomfort.  Will give GI cocktail.  Will check CBC, CMP, lipase, troponin, D-dimer, chest x-ray reevaluate.  Per my review and interpretation of labs troponin unremarkable x 2.  D-dimer is elevated so CT scan was ordered to rule out PE and other etiologies of pain and was unremarkable per radiology report.  No PE.  There does not appear to be any major coronary calcifications either or atherosclerosis.  Overall CT scans unremarkable.  Lab work showed no significant  anemia or electrolyte abnormality or kidney injury or leukocytosis otherwise.  Gallbladder and liver enzymes and lipase all unremarkable as well.  Felt better after GI cocktail.  I think she is safe to follow-up with cardiology outpatient to further rule out cardiac etiology.  Heart score is 2.  Will put her on Protonix for a few weeks and see how she responds to that and have her follow-up with primary care.  She understands return if symptoms worsen or become more worrisome.  She is understanding the plan.  Discharged in good condition.  Understands return precautions.  This chart was dictated using voice recognition software.  Despite best efforts to proofread,  errors can occur which can change the documentation meaning.         Final Clinical Impression(s) / ED Diagnoses Final diagnoses:  Epigastric pain  Atypical chest pain    Rx / DC Orders ED Discharge Orders          Ordered    Ambulatory referral to Cardiology       Comments: If you have not heard from the Cardiology office within the next 72 hours please call [phone].   05/21/23 1824    pantoprazole (PROTONIX) 20 MG tablet  Daily        05/21/23 1825              Virgina Norfolk, DO 05/21/23 1827

## 2023-05-21 NOTE — Discharge Instructions (Addendum)
Take Protonix as prescribed.  Follow-up with cardiology, they should call you with an appointment.  Please return if your symptoms become more severe, more frequent as we discussed.  So far cardiac workup has been unremarkable.  I suspect maybe this could be esophagus related or acid reflux related.  Please have no hesitation to return for reevaluation if symptoms become more intense and more frequent or more worrisome.

## 2023-05-21 NOTE — ED Triage Notes (Addendum)
Pt arrives with c/o chest pain that started about a week ago. Pt endorse SOB with exertion and ABD pain. Pt denies n/v. Pt sent from PCP for abnormal EKG and evaluation.

## 2023-05-26 ENCOUNTER — Ambulatory Visit: Payer: 59 | Attending: Internal Medicine | Admitting: Internal Medicine

## 2023-05-26 ENCOUNTER — Encounter: Payer: Self-pay | Admitting: Internal Medicine

## 2023-05-26 VITALS — BP 144/90 | HR 66 | Ht 63.5 in | Wt 269.0 lb

## 2023-05-26 DIAGNOSIS — R0789 Other chest pain: Secondary | ICD-10-CM | POA: Diagnosis not present

## 2023-05-26 DIAGNOSIS — I10 Essential (primary) hypertension: Secondary | ICD-10-CM

## 2023-05-26 DIAGNOSIS — G4733 Obstructive sleep apnea (adult) (pediatric): Secondary | ICD-10-CM

## 2023-05-26 MED ORDER — PANTOPRAZOLE SODIUM 20 MG PO TBEC: 20.0000 mg | DELAYED_RELEASE_TABLET | Freq: Every day | ORAL | 3 refills | Status: DC

## 2023-05-26 NOTE — Patient Instructions (Signed)
Medication Instructions:  Your physician recommends that you continue on your current medications as directed. Please refer to the Current Medication list given to you today. REFILLED: pantoprazole (Protonix)   *If you need a refill on your cardiac medications before your next appointment, please call your pharmacy*   Lab Work: NONE If you have labs (blood work) drawn today and your tests are completely normal, you will receive your results only by: MyChart Message (if you have MyChart) OR A paper copy in the mail If you have any lab test that is abnormal or we need to change your treatment, we will call you to review the results.   Testing/Procedures: Your physician has requested that you have en exercise stress myoview. For further information please visit [email or web address] Please follow instruction sheet, as given.   You are scheduled for a Myocardial Perfusion Imaging Study. Please arrive 15 minutes prior to your appointment time for registration and insurance purposes.   The test will take approximately 3 to 4 hours to complete; you may bring reading material.  If someone comes with you to your appointment, they will need to remain in the main lobby due to limited space in the testing area. **If you are pregnant or breastfeeding, please notify the nuclear lab prior to your appointment**   How to prepare for your Myocardial Perfusion Test: Do not eat or drink 3 hours prior to your test, except you may have water. Do not consume products containing caffeine (regular or decaffeinated) 12 hours prior to your test. (ex: coffee, chocolate, sodas, tea). Do bring a list of your current medications with you.  If not listed below, you may take your medications as normal. Do wear comfortable clothes (no dresses or overalls) and walking shoes, tennis shoes preferred (No heels or open toe shoes are allowed). Do NOT wear cologne, perfume, aftershave, or lotions (deodorant is allowed). If these  instructions are not followed, your test will have to be rescheduled.  If you cannot keep your appointment, please provide 24 hours notification to the Nuclear Lab, to avoid a possible $50 charge to your account.       Follow-Up: At [facility], you and your health needs are our priority.  As part of our continuing mission to provide you with exceptional heart care, we have created designated Provider Care Teams.  These Care Teams include your primary Cardiologist (physician) and Advanced Practice Providers (APPs -  Physician Assistants and Nurse Practitioners) who all work together to provide you with the care you need, when you need it.   Your next appointment:   1 year(s)  Provider:   Christell Constant, MD

## 2023-05-26 NOTE — Progress Notes (Signed)
  Cardiology Office Note:  .    Date:  05/26/2023  ID:  Ann Walter, DOB January 11, 1966, MRN [id number] PCP: Pearline Cables, MD  [facility] Providers Cardiologist:  Christell Constant, MD     CC: chest pain Consulted for the evaluation of atypical chest pain at the behest of Dr. Lockie Mola  History of Present Illness: .    Ann Walter is a 57 y.o. female HTN who presents after ED f/u for epigastric pain.  Patient notes that she is feeling new chest pain.   Was last feeling well two weeks prior. Had chest pain that felt like chest pressure.  Had ED visit 05/21/23.  After that her chest pain has resolved until our visit today  Patient exertion notable for working at the [employer].  She is fairly sedentary- she can pull weeds and play with her dogs.    No shortness of breath, DOE with walking in from the parking lot.  No PND or orthopnea.  No weight gain, leg swelling , or abdominal swelling.  No syncope or near syncope . Notes no palpitations or funny heart beats.   Has Carpal Tunnel like symptoms when she wakes.  Patient reports prior cardiac testing including CTPE - no CAC or PE. History of problems with the esophagus.  FHX: mother had CAD Twin sister with  CHF NOS.  Relevant histories: .  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   Physical Exam:    VS:  BP (!) 144/90   Pulse 66   Ht 5' 3.5" (1.613 m)   Wt 269 lb (122 kg)   LMP  (LMP Unknown) Comment: pt states she is not have periods  SpO2 97%   BMI 46.90 kg/m    Wt Readings from Last 3 Encounters:  05/26/23 269 lb (122 kg)  05/21/23 267 lb (121.1 kg)  05/21/23 267 lb (121.1 kg)    Gen: no distress, morbid obesity Neck: No JVD Cardiac: No Rubs or Gallops, no murmur, RRR, +2 radial pulses Respiratory: Clear to auscultation bilaterally, normal effort, normal  respiratory rate GI: Soft, nontender, non-distended  MS: No  edema;  moves all extremities Integument: Skin feels warm Neuro:  At time  of evaluation, alert and oriented to person/place/time/situation  Psych: Normal affect, patient feels well   ASSESSMENT AND PLAN: .    Precordial chest pain Family hx of CAD - will get Exercise NM Stress test  GERD - will refill PPI  HTN Morbid obesity - Picky eater; difficult to create meal plan that works, discussed dietary changes - start AMB log; if just white coat  OSA - on CPAP  One year with me  Ann Lam, MD FASE [facility] Cardiologist [facility]  [address] [place], [place] [address] [phone]  2:33 PM

## 2023-05-28 ENCOUNTER — Telehealth (HOSPITAL_COMMUNITY): Payer: Self-pay | Admitting: *Deleted

## 2023-05-28 NOTE — Telephone Encounter (Signed)
Left message on voicemail per DPR in reference to upcoming appointment scheduled on 06/01/2023 at 1:30 with detailed instructions given per Myocardial Perfusion Study Information Sheet for the test. LM to arrive 15 minutes early, and that it is imperative to arrive on time for appointment to keep from having the test rescheduled. If you need to cancel or reschedule your appointment, please call the office within 24 hours of your appointment. Failure to do so may result in a cancellation of your appointment, and a $50 no show fee. Phone number given for call back for any questions.

## 2023-06-01 ENCOUNTER — Ambulatory Visit (HOSPITAL_COMMUNITY): Payer: 59 | Attending: Cardiology

## 2023-06-01 DIAGNOSIS — I10 Essential (primary) hypertension: Secondary | ICD-10-CM | POA: Diagnosis not present

## 2023-06-01 DIAGNOSIS — R0789 Other chest pain: Secondary | ICD-10-CM | POA: Insufficient documentation

## 2023-06-01 DIAGNOSIS — G4733 Obstructive sleep apnea (adult) (pediatric): Secondary | ICD-10-CM | POA: Diagnosis not present

## 2023-06-01 LAB — MYOCARDIAL PERFUSION IMAGING
Estimated workload: 7
Exercise duration (min): 5 min
MPHR: 163 {beats}/min
Peak HR: 162 {beats}/min
Percent HR: 99 %
Rest HR: 84 {beats}/min
Stress Nuclear Isotope Dose: 32.1 mCi

## 2023-06-01 MED ORDER — TECHNETIUM TC 99M TETROFOSMIN IV KIT
32.1000 | PACK | Freq: Once | INTRAVENOUS | Status: AC | PRN
  Administered 2023-06-01: 32.1 via INTRAVENOUS

## 2023-06-02 ENCOUNTER — Ambulatory Visit (HOSPITAL_COMMUNITY): Payer: 59 | Attending: Internal Medicine

## 2023-06-02 MED ORDER — TECHNETIUM TC 99M TETROFOSMIN IV KIT
29.5000 | PACK | Freq: Once | INTRAVENOUS | Status: AC | PRN
  Administered 2023-06-02: 29.5 via INTRAVENOUS

## 2023-06-03 ENCOUNTER — Encounter: Payer: Self-pay | Admitting: Internal Medicine

## 2023-06-11 ENCOUNTER — Encounter: Payer: Self-pay | Admitting: Family Medicine

## 2023-06-11 MED ORDER — SAXENDA 18 MG/3ML ~~LOC~~ SOPN
PEN_INJECTOR | SUBCUTANEOUS | 2 refills | Status: DC
Start: 2023-06-11 — End: 2023-09-05

## 2023-06-11 NOTE — Telephone Encounter (Signed)
Is Saxenda even being manufactured anymore?

## 2023-06-15 ENCOUNTER — Telehealth: Payer: Self-pay

## 2023-06-15 ENCOUNTER — Other Ambulatory Visit (HOSPITAL_COMMUNITY): Payer: Self-pay

## 2023-06-15 ENCOUNTER — Encounter (INDEPENDENT_AMBULATORY_CARE_PROVIDER_SITE_OTHER): Payer: 59 | Admitting: Family Medicine

## 2023-06-15 ENCOUNTER — Encounter: Payer: Self-pay | Admitting: Internal Medicine

## 2023-06-15 DIAGNOSIS — L237 Allergic contact dermatitis due to plants, except food: Secondary | ICD-10-CM | POA: Diagnosis not present

## 2023-06-15 NOTE — Telephone Encounter (Signed)
Pharmacy Patient Advocate Encounter   Received notification from Patient Advice Request messages that prior authorization for Saxenda 18MG /3ML pen-injectors is required/requested.   Insurance verification completed.   The patient is insured through [facility] .   Per test claim: PA required; PA submitted to [facility] via CoverMyMeds Key/confirmation #/EOC [id number] Status is pending

## 2023-06-15 NOTE — Telephone Encounter (Signed)
Do you suggest an apt?

## 2023-06-15 NOTE — Telephone Encounter (Signed)
BP at 7/30 OV 144/90-66  there was question if pt has white coat syndrome.  Currently taking telmisartan 80 mg PO every day.

## 2023-06-15 NOTE — Telephone Encounter (Signed)
Please see the MyChart message reply(ies) for my assessment and plan.  The patient gave consent for this Medical Advice Message and is aware that it may result in a bill to their insurance company as well as the possibility that this may result in a co-payment or deductible. They are an established patient, but are not seeking medical advice exclusively about a problem treated during an in person or video visit in the last 7 days. I did not recommend an in person or video visit within 7 days of my reply.  I spent a total of 12 minutes cumulative time within 7 days through [employer] messaging Lamar Blinks, MD

## 2023-06-15 NOTE — Telephone Encounter (Signed)
[employer] that Prior Authorization for Saxenda 18MG /3ML pen-injectors has been APPROVED from 06/15/23 to 10/15/23.

## 2023-06-15 NOTE — Telephone Encounter (Signed)
Pharmacy Patient Advocate Encounter  Received notification from [facility] that Prior Authorization for Saxenda 18MG /3ML pen-injectors has been APPROVED from 06/15/23 to 10/15/23. Ran test claim, Copay is $50. This test claim was processed through [facility]- copay amounts may vary at other pharmacies due to pharmacy/plan contracts, or as the patient moves through the different stages of their insurance plan.   PA #/Case ID/Reference #: [id number]

## 2023-06-16 ENCOUNTER — Encounter (INDEPENDENT_AMBULATORY_CARE_PROVIDER_SITE_OTHER): Payer: 59 | Admitting: Family Medicine

## 2023-06-16 DIAGNOSIS — I10 Essential (primary) hypertension: Secondary | ICD-10-CM

## 2023-06-16 MED ORDER — AMLODIPINE BESYLATE 2.5 MG PO TABS: 2.5000 mg | ORAL_TABLET | Freq: Every day | ORAL | 3 refills | Status: DC

## 2023-06-16 NOTE — Telephone Encounter (Signed)
Please see the MyChart message reply(ies) for my assessment and plan.  The patient gave consent for this Medical Advice Message and is aware that it may result in a bill to their insurance company as well as the possibility that this may result in a co-payment or deductible. They are an established patient, but are not seeking medical advice exclusively about a problem treated during an in person or video visit in the last 7 days. I did not recommend an in person or video visit within 7 days of my reply.  I spent a total of 12 minutes cumulative time within 7 days through [employer] messaging Lamar Blinks, MD

## 2023-07-02 ENCOUNTER — Encounter: Payer: Self-pay | Admitting: Family Medicine

## 2023-07-02 DIAGNOSIS — R7989 Other specified abnormal findings of blood chemistry: Secondary | ICD-10-CM

## 2023-07-02 DIAGNOSIS — R748 Abnormal levels of other serum enzymes: Secondary | ICD-10-CM

## 2023-07-03 ENCOUNTER — Encounter: Payer: Self-pay | Admitting: Family Medicine

## 2023-07-03 ENCOUNTER — Other Ambulatory Visit (INDEPENDENT_AMBULATORY_CARE_PROVIDER_SITE_OTHER): Payer: 59

## 2023-07-03 DIAGNOSIS — R7989 Other specified abnormal findings of blood chemistry: Secondary | ICD-10-CM

## 2023-07-03 LAB — BASIC METABOLIC PANEL
BUN: 15 mg/dL (ref 6–23)
CO2: 28 meq/L (ref 19–32)
Calcium: 9.1 mg/dL (ref 8.4–10.5)
Chloride: 106 meq/L (ref 96–112)
Creatinine, Ser: 0.96 mg/dL (ref 0.40–1.20)
GFR: 65.59 mL/min (ref >60.00)
Glucose, Bld: 84 mg/dL (ref 70–99)
Potassium: 4.1 meq/L (ref 3.5–5.1)
Sodium: 141 meq/L (ref 135–145)

## 2023-08-01 ENCOUNTER — Other Ambulatory Visit: Payer: Self-pay | Admitting: Family Medicine

## 2023-08-01 DIAGNOSIS — F411 Generalized anxiety disorder: Secondary | ICD-10-CM

## 2023-08-13 ENCOUNTER — Encounter: Payer: Self-pay | Admitting: Family Medicine

## 2023-08-13 ENCOUNTER — Ambulatory Visit: Payer: 59 | Admitting: Physician Assistant

## 2023-08-13 VITALS — BP 149/96 | HR 85 | Ht 63.5 in | Wt 255.2 lb

## 2023-08-13 DIAGNOSIS — S39012A Strain of muscle, fascia and tendon of lower back, initial encounter: Secondary | ICD-10-CM

## 2023-08-13 DIAGNOSIS — R300 Dysuria: Secondary | ICD-10-CM

## 2023-08-13 DIAGNOSIS — Z23 Encounter for immunization: Secondary | ICD-10-CM

## 2023-08-13 LAB — POC URINALSYSI DIPSTICK (AUTOMATED)
Bilirubin, UA: NEGATIVE
Blood, UA: NEGATIVE
Glucose, UA: NEGATIVE
Ketones, UA: NEGATIVE
Leukocytes, UA: NEGATIVE
Nitrite, UA: NEGATIVE
Protein, UA: NEGATIVE
Spec Grav, UA: 1.01 (ref 1.010–1.025)
Urobilinogen, UA: 0.2 U/dL
pH, UA: 6 (ref 5.0–8.0)

## 2023-08-13 MED ORDER — MELOXICAM 15 MG PO TABS
15.0000 mg | ORAL_TABLET | Freq: Every day | ORAL | 0 refills | Status: DC
Start: 2023-08-13 — End: 2023-11-11

## 2023-08-13 MED ORDER — CYCLOBENZAPRINE HCL 5 MG PO TABS: 5.0000 mg | ORAL_TABLET | Freq: Every day | ORAL | 0 refills | Status: DC

## 2023-08-13 NOTE — Progress Notes (Signed)
Established patient visit   Patient: Ann Walter   DOB: May 19, 1966   57 y.o. Female  MRN: [id number] Visit Date: 08/13/2023  Today's healthcare provider: Alfredia Ferguson, PA-C   Chief Complaint  Patient presents with   Back Pain    States theres only back pain present--- no symptoms of UTI but ordered dipstick More right side pain, no injury or falling. Sharp and "squeezing pain"     Subjective     Pt reports right sided lower back pain that feels like a squeezing and does not radiate x 2-3 days. Denies lifting anything heavy ,denies injury or fall. Concerned she might have a bladder infection. No abdominal pain, hematuria, dysuria. Denies any other concerns today.  Medications: Outpatient Medications Prior to Visit  Medication Sig   ALPRAZolam (XANAX) 0.5 MG tablet TAKE 1 TABLET BY MOUTH THREE TIMES A DAY AS NEEDED   amLODipine (NORVASC) 2.5 MG tablet Take 1 tablet (2.5 mg total) by mouth daily.   Liraglutide -Weight Management (SAXENDA) 18 MG/3ML SOPN Start by injecting 0.6 mg [place] daily for one week, go up by 0.6 mg increments weekly to max dose of 3 mg   pantoprazole (PROTONIX) 20 MG tablet Take 1 tablet (20 mg total) by mouth daily.   telmisartan (MICARDIS) 80 MG tablet TAKE 1 TABLET BY MOUTH EVERY DAY   venlafaxine XR (EFFEXOR-XR) 75 MG 24 hr capsule Take 1 capsule (75 mg total) by mouth daily with breakfast.   No facility-administered medications prior to visit.    Review of Systems  Constitutional:  Negative for fatigue and fever.  Respiratory:  Negative for cough and shortness of breath.   Cardiovascular:  Negative for chest pain and leg swelling.  Gastrointestinal:  Negative for abdominal pain.  Musculoskeletal:  Positive for back pain.  Neurological:  Negative for dizziness and headaches.       Objective    BP (!) 149/96   Pulse 85   Ht 5' 3.5" (1.613 m)   Wt 255 lb 4 oz (115.8 kg)   LMP  (LMP Unknown) Comment: pt states she is not have periods   SpO2 100%   BMI 44.51 kg/m    Physical Exam Vitals reviewed.  Constitutional:      Appearance: She is not ill-appearing.  HENT:     Head: Normocephalic.  Eyes:     Conjunctiva/sclera: Conjunctivae normal.  Cardiovascular:     Rate and Rhythm: Normal rate.  Pulmonary:     Effort: Pulmonary effort is normal. No respiratory distress.  Musculoskeletal:     Comments: No edema, erythema, rash of right lumbar back  Neurological:     General: No focal deficit present.     Mental Status: She is alert and oriented to person, place, and time.  Psychiatric:        Mood and Affect: Mood normal. Affect is tearful.        Behavior: Behavior normal.     Comments: Pt was extremely tearful during visit today. When asked if there were any other concerns pt did not elaborate      Results for orders placed or performed in visit on 08/13/23  POCT Urinalysis Dipstick (Automated)  Result Value Ref Range   Color, UA light yellow    Clarity, UA clear    Glucose, UA Negative Negative   Bilirubin, UA negative    Ketones, UA negative    Spec Grav, UA 1.010 1.010 - 1.025   Blood, UA negative  pH, UA 6.0 5.0 - 8.0   Protein, UA Negative Negative   Urobilinogen, UA 0.2 0.2 or 1.0 E.U./dL   Nitrite, UA negative    Leukocytes, UA Negative Negative    Assessment & Plan    Strain of lumbar region, initial encounter Rx meloxicam daily prn pain  Rx flexeril to take in the evenings -     Meloxicam; Take 1 tablet (15 mg total) by mouth daily.  Dispense: 14 tablet; Refill: 0 -     Cyclobenzaprine HCl; Take 1 tablet (5 mg total) by mouth at bedtime.  Dispense: 14 tablet; Refill: 0  Dysuria UA negative. No other urinary symptoms -     POCT Urinalysis Dipstick (Automated)  Immunization due -     Flu vaccine trivalent PF, 6mos and older(Flulaval,Afluria,Fluarix,Fluzone)   Return if symptoms worsen or fail to improve.       Alfredia Ferguson, PA-C  [facility] [phone] (phone) [phone] (fax)  [facility]

## 2023-09-05 ENCOUNTER — Encounter: Payer: Self-pay | Admitting: Family Medicine

## 2023-09-05 ENCOUNTER — Other Ambulatory Visit: Payer: Self-pay | Admitting: Family Medicine

## 2023-09-05 MED ORDER — SAXENDA 18 MG/3ML ~~LOC~~ SOPN
PEN_INJECTOR | SUBCUTANEOUS | 2 refills | Status: DC
Start: 2023-09-05 — End: 2023-09-07

## 2023-09-07 MED ORDER — SAXENDA 18 MG/3ML ~~LOC~~ SOPN
PEN_INJECTOR | SUBCUTANEOUS | 2 refills | Status: DC
Start: 2023-09-07 — End: 2023-11-05

## 2023-09-07 NOTE — Addendum Note (Signed)
Addended by: Abbe Amsterdam C on: 09/07/2023 12:05 PM   Modules accepted: Orders

## 2023-09-16 ENCOUNTER — Telehealth: Payer: Self-pay

## 2023-09-16 NOTE — Telephone Encounter (Signed)
PA initiated via Covermymeds; KEY: [id number].   PA approved   Request Reference Number: [id number]. SAXENDA INJ 18MG /3ML is approved through 09/15/2024. Your patient may now fill this prescription and it will be covered.

## 2023-10-01 ENCOUNTER — Telehealth: Payer: Self-pay

## 2023-10-01 MED ORDER — WEGOVY 0.5 MG/0.5ML ~~LOC~~ SOAJ
0.5000 mg | SUBCUTANEOUS | 1 refills | Status: DC
Start: 2023-10-01 — End: 2023-11-05

## 2023-10-01 NOTE — Telephone Encounter (Signed)
PA initiated via Covermymeds; KEY: [id number]. Awaiting determination.

## 2023-10-01 NOTE — Addendum Note (Signed)
Addended by: Abbe Amsterdam C on: 10/01/2023 06:20 AM   Modules accepted: Orders

## 2023-10-01 NOTE — Telephone Encounter (Signed)
PA approved.   Request Reference Number: [id number]. WEGOVY INJ 0.5MG  is approved through 01/30/2024. Your patient may now fill this prescription and it will be covered.. Authorization Expiration Date: January 30, 2024.

## 2023-10-01 NOTE — Telephone Encounter (Signed)
 Message sent to the pt via my chart

## 2023-10-13 ENCOUNTER — Ambulatory Visit: Payer: 59 | Admitting: Obstetrics and Gynecology

## 2023-10-30 ENCOUNTER — Other Ambulatory Visit: Payer: Self-pay | Admitting: Family Medicine

## 2023-10-30 DIAGNOSIS — F411 Generalized anxiety disorder: Secondary | ICD-10-CM

## 2023-11-01 ENCOUNTER — Encounter: Payer: Self-pay | Admitting: Obstetrics and Gynecology

## 2023-11-02 ENCOUNTER — Encounter: Payer: Self-pay | Admitting: Family Medicine

## 2023-11-02 NOTE — Telephone Encounter (Signed)
 Rx request has been fwd to you.

## 2023-11-05 ENCOUNTER — Other Ambulatory Visit: Payer: Self-pay | Admitting: Family Medicine

## 2023-11-06 ENCOUNTER — Encounter: Payer: 59 | Admitting: Obstetrics and Gynecology

## 2023-11-10 NOTE — Patient Instructions (Addendum)
 Good to see you again today!  Lets increase your Wegovy  to 1 mg weekly for a month, after that we can increase again to 1.7.  If you would like to make use of the 0.5 you still have on hand it is okay to take 2 shots instead of 1 (for a total of 1 mg)  Please let me know when you are ready to go up on your dosage again.  I will be in touch with your lab work

## 2023-11-10 NOTE — Progress Notes (Signed)
 [facility] [address], [address] [place], [place] [address] [phone]  Date:  11/11/2023   Name:  Ann Walter   DOB:  [birth year]   MRN:  [id number]  PCP:  Kaylee Partridge, MD    Chief Complaint: Possible Menopause (Pt would like to check on her hormone levels because she has been very irritated. /Concerns/ questions: time to increase her glp1 dose?)   History of Present Illness:  Ann Walter is a 58 y.o. very pleasant female patient who presents with the following:  Pt seen today to discuss her GLP-1 medication and some concerns about menopause Last seen by myself in July- at that time she had concern of CP and SOB so I had her seen in the ER History of HTN, OSA, pre-diabetes, depression, obesity  Can update routine labs if she would like  [facility] 2023 was in menopause range- 2019 as well   Wt Readings from Last 3 Encounters:  11/11/23 267 lb (121.1 kg)  08/13/23 255 lb 4 oz (115.8 kg)  06/01/23 269 lb (122 kg)   She has been on 0.5 wegovy  for one month - she would like to go up to 1mg  now She notes she is tolerating this well.  However, she has not started to lose significant weight yet.  We hope this will improve with the higher dose  She notes she is "more irritable" and wonders if this might be due to menopause-she wondered when menopause might be "over" Advised Tyra Galley menopause refers to the end of ovulation, this is not something that has a distinct end.  She has a long history of depression, anxiety, anger symptoms.  At this time did he states she is overall doing better than she has in the past.  She has a job with the city [employer] that she likes, it is not stressful and certainly better than [employer].  Overall she thinks she is doing okay and does not want to start a medication for her mood right now  She is not fasting this am - we can update labs   Patient Active Problem List   Diagnosis Date  Noted   OSA on CPAP 02/03/2022   Left shoulder pain 12/22/2017   Upper airway cough syndrome 09/14/2017   Pre-diabetes 11/22/2015   Morbid obesity due to excess calories (HCC) 11/21/2015   Ovarian cyst 02/28/2015   Chest pain, atypical 03/08/2014   Heart palpitations 03/08/2014   GAD (generalized anxiety disorder) 02/13/2014   Depression    Essential hypertension     Past Medical History:  Diagnosis Date   Allergy    Benign hematuria 12/2009   WORKUP WITH DR. Levi Real WAS NEGATIVE   Depression    Family history of adverse reaction to anesthesia    My dad -  slow awaking   Headache    Mirgraine  - years ago   Heart murmur    HTN (hypertension)    Obesity    Pre-diabetes    Ringing in ears, bilateral    Sleep apnea    UTI (urinary tract infection), uncomplicated     Past Surgical History:  Procedure Laterality Date   CHOLECYSTECTOMY  10/2010   CYSTECTOMY     x 2 from chest   KNEE ARTHROSCOPY Right 12/19/2020   Procedure: RIGHT KNEE ARTHROSCOPY WITH DEBRIDEMENT;  Surgeon: Timothy Ford, MD;  Location: [facility] OR;  Service: Orthopedics;  Laterality: Right;  Social History   Tobacco Use   Smoking status: Never   Smokeless tobacco: Never   Tobacco comments:    Smokes marijuana every once in a while.  03/10/22  hfb  Vaping Use   Vaping status: Never Used  Substance Use Topics   Alcohol use: Not Currently    Alcohol/week: 0.0 standard drinks of alcohol    Comment: occasionally   Drug use: No    Family History  Problem Relation Age of Onset   Diabetes Mother    Heart disease Mother        HAD TRIPLE BYPASS   Hypertension Mother    Kidney disease Father    Breast cancer Sister    Breast cancer Maternal Grandmother    Cancer Maternal Grandmother        LYMPH NODE CANCER   Cancer Maternal Aunt        COLORECTAL    COPD Brother    Kidney failure Brother    Colon cancer Neg Hx     Allergies  Allergen Reactions   Wellbutrin  [Bupropion ] Swelling    Throat  swelling    Sulfonamide Derivatives Other (See Comments)    Unknown per pt, it's been a long time    Medication list has been reviewed and updated.  Current Outpatient Medications on File Prior to Visit  Medication Sig Dispense Refill   ALPRAZolam  (XANAX ) 0.5 MG tablet TAKE 1 TABLET BY MOUTH THREE TIMES A DAY AS NEEDED 90 tablet 1   amLODipine  (NORVASC ) 2.5 MG tablet Take 1 tablet (2.5 mg total) by mouth daily. 90 tablet 3   Cyanocobalamin (VITAMIN B 12 PO) Take by mouth.     pantoprazole  (PROTONIX ) 20 MG tablet Take 1 tablet (20 mg total) by mouth daily. 90 tablet 3   telmisartan  (MICARDIS ) 80 MG tablet TAKE 1 TABLET BY MOUTH EVERY DAY 90 tablet 3   Vitamin D , Ergocalciferol , (DRISDOL) 1.25 MG (50000 UNIT) CAPS capsule Take by mouth.     No current facility-administered medications on file prior to visit.    Review of Systems:  As per HPI- otherwise negative.   Physical Examination: Vitals:   11/11/23 0829  BP: 116/62  Pulse: 65  Resp: 18  Temp: 98.3 F (36.8 C)  SpO2: 97%   Vitals:   11/11/23 0829  Weight: 267 lb (121.1 kg)  Height: 5' 3.5" (1.613 m)   Body mass index is 46.56 kg/m. Ideal Body Weight: Weight in (lb) to have BMI = 25: 143.1  GEN: no acute distress.  Obese, looks well HEENT: Atraumatic, Normocephalic.  Ears and Nose: No external deformity. CV: RRR, No M/G/R. No JVD. No thrill. No extra heart sounds. PULM: CTA B, no wheezes, crackles, rhonchi. No retractions. No resp. distress. No accessory muscle use. EXTR: No c/c/e PSYCH: Normally interactive. Conversant.    Assessment and Plan: Morbid obesity due to excess calories (HCC) - Plan: Semaglutide -Weight Management (WEGOVY ) 1 MG/0.5ML SOAJ  Vitamin D  deficiency - Plan: VITAMIN D  25 Hydroxy (Vit-D Deficiency, Fractures)  Essential hypertension - Plan: CBC, Comprehensive metabolic panel  Depression, unspecified depression type  Pre-diabetes - Plan: Hemoglobin A1c  Screening, lipid - Plan:  Lipid panel  Thyroid  disorder screening - Plan: TSH  Patient seen today for follow-up.  Will increase her dose Wegovy  to 1 mg, can increase again in another month, she will keep me posted  Blood pressure under very good control  Patient feels like her depression symptoms are okay right now, she does not want  to go on medication.  Will continue to monitor  Will plan further follow- up pending labs.   Signed Gates Kasal, MD  Received labs as below, message to patient  Results for orders placed or performed in visit on 11/11/23  CBC   Collection Time: 11/11/23  8:52 AM  Result Value Ref Range   WBC 6.4 4.0 - 10.5 K/uL   RBC 4.27 3.87 - 5.11 Mil/uL   Platelets 253.0 150.0 - 400.0 K/uL   Hemoglobin 12.6 12.0 - 15.0 g/dL   HCT 13.2 44.0 - 10.2 %   MCV 89.5 78.0 - 100.0 fl   MCHC 33.0 30.0 - 36.0 g/dL   RDW 72.5 36.6 - 44.0 %  Comprehensive metabolic panel   Collection Time: 11/11/23  8:52 AM  Result Value Ref Range   Sodium 142 135 - 145 mEq/L   Potassium 4.6 3.5 - 5.1 mEq/L   Chloride 105 96 - 112 mEq/L   CO2 30 19 - 32 mEq/L   Glucose, Bld 88 70 - 99 mg/dL   BUN 17 6 - 23 mg/dL   Creatinine, Ser 3.47 0.40 - 1.20 mg/dL   Total Bilirubin 0.5 0.2 - 1.2 mg/dL   Alkaline Phosphatase 98 39 - 117 U/L   AST 15 0 - 37 U/L   ALT 15 0 - 35 U/L   Total Protein 6.5 6.0 - 8.3 g/dL   Albumin 4.3 3.5 - 5.2 g/dL   GFR 42.59 >56.38 mL/min   Calcium 9.5 8.4 - 10.5 mg/dL  Hemoglobin V5I   Collection Time: 11/11/23  8:52 AM  Result Value Ref Range   Hgb A1c MFr Bld 6.1 4.6 - 6.5 %  Lipid panel   Collection Time: 11/11/23  8:52 AM  Result Value Ref Range   Cholesterol 157 0 - 200 mg/dL   Triglycerides 433.2 0.0 - 149.0 mg/dL   HDL 95.18 >84.16 mg/dL   VLDL 60.6 0.0 - 30.1 mg/dL   LDL Cholesterol 82 0 - 99 mg/dL   Total CHOL/HDL Ratio 3    NonHDL 103.01   TSH   Collection Time: 11/11/23  8:52 AM  Result Value Ref Range   TSH 2.40 0.35 - 5.50 uIU/mL  VITAMIN D  25 Hydroxy  (Vit-D Deficiency, Fractures)   Collection Time: 11/11/23  8:52 AM  Result Value Ref Range   VITD 45.69 30.00 - 100.00 ng/mL

## 2023-11-11 ENCOUNTER — Encounter: Payer: Self-pay | Admitting: Family Medicine

## 2023-11-11 ENCOUNTER — Ambulatory Visit: Payer: 59 | Admitting: Family Medicine

## 2023-11-11 DIAGNOSIS — F32A Depression, unspecified: Secondary | ICD-10-CM | POA: Diagnosis not present

## 2023-11-11 DIAGNOSIS — Z13220 Encounter for screening for lipoid disorders: Secondary | ICD-10-CM | POA: Diagnosis not present

## 2023-11-11 DIAGNOSIS — I10 Essential (primary) hypertension: Secondary | ICD-10-CM | POA: Diagnosis not present

## 2023-11-11 DIAGNOSIS — E559 Vitamin D deficiency, unspecified: Secondary | ICD-10-CM

## 2023-11-11 DIAGNOSIS — Z1329 Encounter for screening for other suspected endocrine disorder: Secondary | ICD-10-CM | POA: Diagnosis not present

## 2023-11-11 DIAGNOSIS — R7303 Prediabetes: Secondary | ICD-10-CM

## 2023-11-11 LAB — COMPREHENSIVE METABOLIC PANEL
ALT: 15 U/L (ref 0–35)
AST: 15 U/L (ref 0–37)
Albumin: 4.3 g/dL (ref 3.5–5.2)
Alkaline Phosphatase: 98 U/L (ref 39–117)
BUN: 17 mg/dL (ref 6–23)
CO2: 30 meq/L (ref 19–32)
Calcium: 9.5 mg/dL (ref 8.4–10.5)
Chloride: 105 meq/L (ref 96–112)
Creatinine, Ser: 0.88 mg/dL (ref 0.40–1.20)
GFR: 72.62 mL/min (ref >60.00)
Glucose, Bld: 88 mg/dL (ref 70–99)
Potassium: 4.6 meq/L (ref 3.5–5.1)
Sodium: 142 meq/L (ref 135–145)
Total Bilirubin: 0.5 mg/dL (ref 0.2–1.2)
Total Protein: 6.5 g/dL (ref 6.0–8.3)

## 2023-11-11 LAB — CBC
HCT: 38.3 % (ref 36.0–46.0)
Hemoglobin: 12.6 g/dL (ref 12.0–15.0)
MCHC: 33 g/dL (ref 30.0–36.0)
MCV: 89.5 fL (ref 78.0–100.0)
Platelets: 253 10*3/uL (ref 150.0–400.0)
RBC: 4.27 Mil/uL (ref 3.87–5.11)
RDW: 14.1 % (ref 11.5–15.5)
WBC: 6.4 10*3/uL (ref 4.0–10.5)

## 2023-11-11 LAB — LIPID PANEL
Cholesterol: 157 mg/dL (ref 0–200)
HDL: 54.1 mg/dL (ref >39.00)
LDL Cholesterol: 82 mg/dL (ref 0–99)
NonHDL: 103.01
Total CHOL/HDL Ratio: 3
Triglycerides: 107 mg/dL (ref 0.0–149.0)
VLDL: 21.4 mg/dL (ref 0.0–40.0)

## 2023-11-11 LAB — TSH: TSH: 2.4 u[IU]/mL (ref 0.35–5.50)

## 2023-11-11 LAB — HEMOGLOBIN A1C: Hgb A1c MFr Bld: 6.1 % (ref 4.6–6.5)

## 2023-11-11 LAB — VITAMIN D 25 HYDROXY (VIT D DEFICIENCY, FRACTURES): VITD: 45.69 ng/mL (ref 30.00–100.00)

## 2023-11-11 MED ORDER — WEGOVY 1 MG/0.5ML ~~LOC~~ SOAJ
1.0000 mg | SUBCUTANEOUS | 1 refills | Status: DC
Start: 2023-11-11 — End: 2023-12-14

## 2023-12-14 ENCOUNTER — Encounter: Payer: Self-pay | Admitting: Family Medicine

## 2023-12-14 MED ORDER — WEGOVY 1.7 MG/0.75ML ~~LOC~~ SOAJ: 1.7000 mg | SUBCUTANEOUS | 2 refills | Status: DC

## 2023-12-22 ENCOUNTER — Other Ambulatory Visit: Payer: Self-pay | Admitting: Family Medicine

## 2023-12-22 DIAGNOSIS — I10 Essential (primary) hypertension: Secondary | ICD-10-CM

## 2023-12-28 ENCOUNTER — Other Ambulatory Visit: Payer: Self-pay | Admitting: Family Medicine

## 2023-12-28 DIAGNOSIS — F411 Generalized anxiety disorder: Secondary | ICD-10-CM

## 2024-01-03 ENCOUNTER — Encounter: Payer: Self-pay | Admitting: Family Medicine

## 2024-01-14 MED ORDER — WEGOVY 2.4 MG/0.75ML ~~LOC~~ SOAJ: 2.4000 mg | SUBCUTANEOUS | 1 refills | Status: DC

## 2024-02-07 ENCOUNTER — Encounter: Payer: Self-pay | Admitting: Family Medicine

## 2024-02-08 MED ORDER — TIRZEPATIDE-WEIGHT MANAGEMENT 5 MG/0.5ML ~~LOC~~ SOLN: 5.0000 mg | SUBCUTANEOUS | 1 refills | Status: DC

## 2024-02-09 ENCOUNTER — Other Ambulatory Visit (HOSPITAL_COMMUNITY): Payer: Self-pay

## 2024-02-09 ENCOUNTER — Telehealth: Payer: Self-pay | Admitting: Pharmacy Technician

## 2024-02-09 NOTE — Telephone Encounter (Signed)
 Pharmacy Patient Advocate Encounter   Received notification from CoverMyMeds that prior authorization for Zepbound 5MG /0.5ML pen-injectors is required/requested.   Insurance verification completed.   The patient is insured through [facility] .   Per test claim: PA required; PA submitted to above mentioned insurance via CoverMyMeds Key/confirmation #/EOC [id number] Status is pending

## 2024-02-09 NOTE — Telephone Encounter (Signed)
 Pt notified via mychart

## 2024-02-09 NOTE — Telephone Encounter (Signed)
 PA team started the PA this AM for Zepbound. Will have the determination soon.

## 2024-02-09 NOTE — Telephone Encounter (Signed)
 Pharmacy Patient Advocate Encounter  Received notification from [facility] that Prior Authorization for Zepbound 5MG /0.5ML pen-injectors has been APPROVED from 02/09/24 to 08/10/24. Ran test claim, Copay is $24.99. This test claim was processed through [facility]- copay amounts may vary at other pharmacies due to pharmacy/plan contracts, or as the patient moves through the different stages of their insurance plan.   PA #/Case ID/Reference #: [id number]

## 2024-02-13 NOTE — Progress Notes (Unsigned)
 [facility] [address] [place], [place] [address] [phone]  Date:  02/17/2024   Name:  Ann Walter   DOB:  1965-12-27   MRN:  [id number]  PCP:  Ann Partridge, MD    Chief Complaint: No chief complaint on file.   History of Present Illness:  Ann Walter is a 57 y.o. very pleasant female patient who presents with the following:  Patient seen today for a routine follow-up visit-most recent visit with myself was in January History of hypertension, sleep apnea, prediabetes, depression, obesity At her visit in January I increased her Wegovy  to 1 mg-increased again to 1.7 in February She reached out to us  earlier this month with concern of lack of weight loss with Wegovy , we changed her over to Zepbound 5 mg  Patient Active Problem List   Diagnosis Date Noted   OSA on CPAP 02/03/2022   Left shoulder pain 12/22/2017   Upper airway cough syndrome 09/14/2017   Pre-diabetes 11/22/2015   Morbid obesity due to excess calories (HCC) 11/21/2015   Ovarian cyst 02/28/2015   Chest pain, atypical 03/08/2014   Heart palpitations 03/08/2014   GAD (generalized anxiety disorder) 02/13/2014   Depression    Essential hypertension     Past Medical History:  Diagnosis Date   Allergy    Benign hematuria 12/2009   WORKUP WITH DR. Levi Real WAS NEGATIVE   Depression    Family history of adverse reaction to anesthesia    My dad -  slow awaking   Headache    Mirgraine  - years ago   Heart murmur    HTN (hypertension)    Obesity    Pre-diabetes    Ringing in ears, bilateral    Sleep apnea    UTI (urinary tract infection), uncomplicated     Past Surgical History:  Procedure Laterality Date   CHOLECYSTECTOMY  10/2010   CYSTECTOMY     x 2 from chest   KNEE ARTHROSCOPY Right 12/19/2020   Procedure: RIGHT KNEE ARTHROSCOPY WITH DEBRIDEMENT;  Surgeon: Timothy Ford, MD;  Location: [facility] OR;  Service: Orthopedics;   Laterality: Right;    Social History   Tobacco Use   Smoking status: Never   Smokeless tobacco: Never   Tobacco comments:    Smokes marijuana every once in a while.  03/10/22  hfb  Vaping Use   Vaping status: Never Used  Substance Use Topics   Alcohol use: Not Currently    Alcohol/week: 0.0 standard drinks of alcohol    Comment: occasionally   Drug use: No    Family History  Problem Relation Age of Onset   Diabetes Mother    Heart disease Mother        HAD TRIPLE BYPASS   Hypertension Mother    Kidney disease Father    Breast cancer Sister    Breast cancer Maternal Grandmother    Cancer Maternal Grandmother        LYMPH NODE CANCER   Cancer Maternal Aunt        COLORECTAL    COPD Brother    Kidney failure Brother    Colon cancer Neg Hx     Allergies  Allergen Reactions   Wellbutrin  [Bupropion ] Swelling    Throat swelling    Sulfonamide Derivatives Other (See Comments)    Unknown per pt, it's been a long time    Medication list has been reviewed and  updated.  Current Outpatient Medications on File Prior to Visit  Medication Sig Dispense Refill   ALPRAZolam  (XANAX ) 0.5 MG tablet TAKE 1 TABLET BY MOUTH THREE TIMES A DAY AS NEEDED 90 tablet 1   amLODipine  (NORVASC ) 2.5 MG tablet Take 1 tablet (2.5 mg total) by mouth daily. 90 tablet 3   Cyanocobalamin (VITAMIN B 12 PO) Take by mouth.     pantoprazole  (PROTONIX ) 20 MG tablet Take 1 tablet (20 mg total) by mouth daily. 90 tablet 3   telmisartan  (MICARDIS ) 80 MG tablet TAKE 1 TABLET BY MOUTH EVERY DAY 90 tablet 3   tirzepatide 5 MG/0.5ML injection vial Inject 5 mg into the skin once a week. 2 mL 1   Vitamin D , Ergocalciferol , (DRISDOL) 1.25 MG (50000 UNIT) CAPS capsule Take by mouth.     No current facility-administered medications on file prior to visit.    Review of Systems:  As per HPI- otherwise negative.'  Physical Examination: There were no vitals filed for this visit. There were no vitals filed for  this visit. There is no height or weight on file to calculate BMI. Ideal Body Weight:    GEN: no acute distress. HEENT: Atraumatic, Normocephalic.  Ears and Nose: No external deformity. CV: RRR, No M/G/R. No JVD. No thrill. No extra heart sounds. PULM: CTA B, no wheezes, crackles, rhonchi. No retractions. No resp. distress. No accessory muscle use. ABD: S, NT, ND, +BS. No rebound. No HSM. EXTR: No c/c/e PSYCH: Normally interactive. Conversant.    Assessment and Plan: ***  Signed Gates Kasal, MD

## 2024-02-13 NOTE — Patient Instructions (Incomplete)
 It was good to see you again today- I am sorry you have been so stressed out  I made a referral to psychology for you to hopefully get you set up with a counselor - please let me know if you need other assistance   When you are getting to your last 1-2 doses of the tirzepatide/ zepbound 5mg  fill the 7.5 mg strength- can move up to 7.5 when you run out of what you have

## 2024-02-17 ENCOUNTER — Encounter: Payer: Self-pay | Admitting: Family Medicine

## 2024-02-17 ENCOUNTER — Ambulatory Visit: Admitting: Family Medicine

## 2024-02-17 DIAGNOSIS — R454 Irritability and anger: Secondary | ICD-10-CM | POA: Diagnosis not present

## 2024-02-17 MED ORDER — TIRZEPATIDE-WEIGHT MANAGEMENT 7.5 MG/0.5ML ~~LOC~~ SOLN: 7.5000 mg | SUBCUTANEOUS | 1 refills | Status: DC

## 2024-02-27 ENCOUNTER — Other Ambulatory Visit: Payer: Self-pay | Admitting: Family Medicine

## 2024-02-27 DIAGNOSIS — F411 Generalized anxiety disorder: Secondary | ICD-10-CM

## 2024-04-22 ENCOUNTER — Encounter: Payer: Self-pay | Admitting: Family Medicine

## 2024-04-22 MED ORDER — TIRZEPATIDE-WEIGHT MANAGEMENT 10 MG/0.5ML ~~LOC~~ SOLN: 10.0000 mg | SUBCUTANEOUS | 3 refills | Status: DC

## 2024-04-29 ENCOUNTER — Other Ambulatory Visit: Payer: Self-pay | Admitting: Family Medicine

## 2024-04-29 DIAGNOSIS — F411 Generalized anxiety disorder: Secondary | ICD-10-CM

## 2024-05-06 MED ORDER — ZEPBOUND 10 MG/0.5ML ~~LOC~~ SOAJ: 10.0000 mg | SUBCUTANEOUS | 3 refills | Status: DC

## 2024-05-06 NOTE — Addendum Note (Signed)
 Addended by: Reeanna Acri D on: 05/06/2024 07:44 AM   Modules accepted: Orders

## 2024-05-17 ENCOUNTER — Other Ambulatory Visit: Payer: Self-pay | Admitting: Family Medicine

## 2024-05-17 DIAGNOSIS — Z1231 Encounter for screening mammogram for malignant neoplasm of breast: Secondary | ICD-10-CM

## 2024-05-24 ENCOUNTER — Other Ambulatory Visit: Payer: Self-pay | Admitting: Internal Medicine

## 2024-05-26 ENCOUNTER — Other Ambulatory Visit: Payer: Self-pay

## 2024-05-26 MED ORDER — PANTOPRAZOLE SODIUM 20 MG PO TBEC: 20.0000 mg | DELAYED_RELEASE_TABLET | Freq: Every day | ORAL | 0 refills | Status: DC

## 2024-05-31 NOTE — Progress Notes (Signed)
 This encounter was created in error - please disregard.

## 2024-06-01 ENCOUNTER — Ambulatory Visit
Admission: RE | Admit: 2024-06-01 | Discharge: 2024-06-01 | Disposition: A | Discharge status: Home / Self Care | Source: Ambulatory Visit

## 2024-06-01 DIAGNOSIS — Z1231 Encounter for screening mammogram for malignant neoplasm of breast: Secondary | ICD-10-CM

## 2024-06-20 ENCOUNTER — Ambulatory Visit (HOSPITAL_COMMUNITY)
Admission: EM | Admit: 2024-06-20 | Discharge: 2024-06-20 | Disposition: A | Discharge status: Home / Self Care | Attending: Family Medicine | Admitting: Family Medicine

## 2024-06-20 ENCOUNTER — Encounter (HOSPITAL_COMMUNITY): Payer: Self-pay

## 2024-06-20 ENCOUNTER — Ambulatory Visit (INDEPENDENT_AMBULATORY_CARE_PROVIDER_SITE_OTHER)

## 2024-06-20 ENCOUNTER — Other Ambulatory Visit: Payer: Self-pay | Admitting: Internal Medicine

## 2024-06-20 DIAGNOSIS — M25562 Pain in left knee: Secondary | ICD-10-CM | POA: Diagnosis not present

## 2024-06-20 MED ORDER — KETOROLAC TROMETHAMINE 30 MG/ML IJ SOLN
30.0000 mg | Freq: Once | INTRAMUSCULAR | Status: AC
  Administered 2024-06-20: 30 mg via INTRAMUSCULAR

## 2024-06-20 MED ORDER — KETOROLAC TROMETHAMINE 30 MG/ML IJ SOLN
INTRAMUSCULAR | Status: AC
  Filled 2024-06-20: qty 1

## 2024-06-20 NOTE — ED Provider Notes (Signed)
 [facility]    CSN: [id number] Arrival date & time: 06/20/24  1027      History   Chief Complaint Chief Complaint  Patient presents with   Knee Injury    HPI Ann Walter is a 58 y.o. female.   Patient is here for left knee pain.  Yesterday she tripped over a root/tree branch and hurt her knee.  She did not fall or land on it, but since then having pain.  Thinks she twisted it.  She was at work today and could not really stand on it.  Some pain into the left hamstring.  Feels swollen, but did not see anything necessarily.  She took motrin  400mg  early this morning without help.        Past Medical History:  Diagnosis Date   Allergy    Benign hematuria 12/2009   WORKUP WITH DR. ALEENE WAS NEGATIVE   Depression    Family history of adverse reaction to anesthesia    My dad -  slow awaking   Headache    Mirgraine  - years ago   Heart murmur    HTN (hypertension)    Obesity    Pre-diabetes    Ringing in ears, bilateral    Sleep apnea    UTI (urinary tract infection), uncomplicated     Patient Active Problem List   Diagnosis Date Noted   OSA on CPAP 02/03/2022   Left shoulder pain 12/22/2017   Upper airway cough syndrome 09/14/2017   Pre-diabetes 11/22/2015   Morbid obesity due to excess calories (HCC) 11/21/2015   Ovarian cyst 02/28/2015   Chest pain, atypical 03/08/2014   Heart palpitations 03/08/2014   GAD (generalized anxiety disorder) 02/13/2014   Depression    Essential hypertension     Past Surgical History:  Procedure Laterality Date   CHOLECYSTECTOMY  10/2010   CYSTECTOMY     x 2 from chest   KNEE ARTHROSCOPY Right 12/19/2020   Procedure: RIGHT KNEE ARTHROSCOPY WITH DEBRIDEMENT;  Surgeon: Harden Jerona GAILS, MD;  Location: [facility] OR;  Service: Orthopedics;  Laterality: Right;    OB History     Gravida  1   Para      Term      Preterm      AB  1   Living  0      SAB      IAB  1   Ectopic      Multiple      Live Births                Home Medications    Prior to Admission medications   Medication Sig Start Date End Date Taking? Authorizing Provider  ALPRAZolam  (XANAX ) 0.5 MG tablet TAKE 1 TABLET BY MOUTH THREE TIMES A DAY AS NEEDED 05/02/24   Copland, Harlene BROCKS, MD  amLODipine  (NORVASC ) 2.5 MG tablet TAKE 1 TABLET BY MOUTH EVERY DAY 05/24/24   Chandrasekhar, Mahesh A, MD  pantoprazole  (PROTONIX ) 20 MG tablet Take 1 tablet (20 mg total) by mouth daily. 05/26/24   Chandrasekhar, Stanly LABOR, MD  telmisartan  (MICARDIS ) 80 MG tablet TAKE 1 TABLET BY MOUTH EVERY DAY 12/22/23   Copland, Harlene BROCKS, MD  tirzepatide  (ZEPBOUND ) 10 MG/0.5ML Pen Inject 10 mg into the skin once a week. 05/06/24   Copland, Harlene BROCKS, MD    Family History Family History  Problem Relation Age of Onset   Diabetes Mother    Heart disease Mother  HAD TRIPLE BYPASS   Hypertension Mother    Kidney disease Father    Breast cancer Sister    Breast cancer Maternal Grandmother    Cancer Maternal Grandmother        LYMPH NODE CANCER   Cancer Maternal Aunt        COLORECTAL    COPD Brother    Kidney failure Brother    Colon cancer Neg Hx     Social History Social History   Tobacco Use   Smoking status: Never   Smokeless tobacco: Never   Tobacco comments:    Smokes marijuana every once in a while.  03/10/22  hfb  Vaping Use   Vaping status: Never Used  Substance Use Topics   Alcohol use: Not Currently    Alcohol/week: 0.0 standard drinks of alcohol    Comment: occasionally   Drug use: No     Allergies   Wellbutrin  [bupropion ] and Sulfonamide derivatives   Review of Systems Review of Systems  Constitutional: Negative.   HENT: Negative.    Respiratory: Negative.    Cardiovascular: Negative.   Genitourinary: Negative.   Musculoskeletal:  Positive for gait problem.  Psychiatric/Behavioral: Negative.       Physical Exam Triage Vital Signs ED Triage Vitals  Encounter Vitals Group     BP 06/20/24 1122 114/75      Girls Systolic BP Percentile --      Girls Diastolic BP Percentile --      Boys Systolic BP Percentile --      Boys Diastolic BP Percentile --      Pulse Rate 06/20/24 1122 66     Resp 06/20/24 1122 16     Temp 06/20/24 1122 98.3 F (36.8 C)     Temp Source 06/20/24 1122 Oral     SpO2 06/20/24 1122 94 %     Weight --      Height --      Head Circumference --      Peak Flow --      Pain Score 06/20/24 1123 8     Pain Loc --      Pain Education --      Exclude from Growth Chart --    No data found.  Updated Vital Signs BP 114/75 (BP Location: Right Arm)   Pulse 66   Temp 98.3 F (36.8 C) (Oral)   Resp 16   LMP  (LMP Unknown) Comment: pt states she is not have periods  SpO2 94%   Visual Acuity Right Eye Distance:   Left Eye Distance:   Bilateral Distance:    Right Eye Near:   Left Eye Near:    Bilateral Near:     Physical Exam Constitutional:      Appearance: Normal appearance. She is normal weight.     Comments: Sitting in a wheel chair  Musculoskeletal:     Comments: TTP to the posterior left knee, medially and anteriorly.  Decreased rom with flexion due to pain.  teaful  Neurological:     General: No focal deficit present.     Mental Status: She is alert.  Psychiatric:        Mood and Affect: Mood normal.      UC Treatments / Results  Labs (all labs ordered are listed, but only abnormal results are displayed) Labs Reviewed - No data to display  EKG   Radiology No results found.  Procedures Procedures (including critical care time)  Medications Ordered in UC Medications  ketorolac  (TORADOL ) 30 MG/ML injection 30 mg (30 mg Intramuscular Given 06/20/24 1209)    Initial Impression / Assessment and Plan / UC Course  I have reviewed the triage vital signs and the nursing notes.  Pertinent labs & imaging results that were available during my care of the patient were reviewed by me and considered in my medical decision making (see chart for  details).   Final Clinical Impressions(s) / UC Diagnoses   Final diagnoses:  Acute pain of left knee     Discharge Instructions      You were seen today for knee pain.  Your xray appears normal.  If the radiologist reads this differently we will notify you.  I am concerned about a possible tear in the knee.  I recommend you follow up with [facility] by calling [phone].   I have given you a knee brace.  You may take motrin  600mg  every 6 hrs for pain.  I have given you a shot of toradol  here, so your next dose of motrin  will be after 6pm this evening.  I have given you a note for work for the next several days as well.     ED Prescriptions   None    PDMP not reviewed this encounter.   Darral Longs, MD 06/20/24 1220

## 2024-06-20 NOTE — ED Triage Notes (Signed)
 Patient here today with c/o left knee pain X 1 day after tripping on a root. Patient has taken Ibuprofen  and used Voltaren gel with no relief.

## 2024-06-20 NOTE — Discharge Instructions (Addendum)
 You were seen today for knee pain.  Your xray appears normal.  If the radiologist reads this differently we will notify you.  I am concerned about a possible tear in the knee.  I recommend you follow up with [facility] by calling [phone].   I have given you a knee brace.  You may take motrin  600mg  every 6 hrs for pain.  I have given you a shot of toradol  here, so your next dose of motrin  will be after 6pm this evening.  I have given you a note for work for the next several days as well.

## 2024-06-23 ENCOUNTER — Encounter: Payer: Self-pay | Admitting: Psychology

## 2024-06-23 ENCOUNTER — Ambulatory Visit (INDEPENDENT_AMBULATORY_CARE_PROVIDER_SITE_OTHER): Admitting: Psychology

## 2024-06-23 DIAGNOSIS — F330 Major depressive disorder, recurrent, mild: Secondary | ICD-10-CM

## 2024-06-23 DIAGNOSIS — F411 Generalized anxiety disorder: Secondary | ICD-10-CM | POA: Diagnosis not present

## 2024-06-23 NOTE — Progress Notes (Signed)
   Ann Walter, [facility]

## 2024-06-23 NOTE — Progress Notes (Signed)
 [facility] Counselor Initial Adult Exam  Name: Ann Walter Date: 06/23/2024 MRN: [id number] DOB: 1966-07-21 PCP: Watt Harlene BROCKS, MD  Time spent: 8:01am-9:05am  Pt is seen for an in person visit.  Guardian/Payee:  self     Paperwork requested: No   Reason for Visit /Presenting Problem: Pt is seeking counseling for depression, stress and  irritability.  Pt reports her major stressor current is her marriage.  Pt reports in past 2 years marriage stressors- conflict and discussing separation.  Pt reports doesn't want in drama in her relationship.  Pt reports first started couple yeas ago when husband accused of cheating- when didn't and now complains that doesn't talk. Pt reports she was ready to leave last month but husband requested marriage counseling and pt also not interested in starting over w/ house etc. at her age.  Pt also reports significant loss in her life w/ mom, dad, brother all deceased.    Mental Status Exam: Appearance:   Well Groomed     Behavior:  Appropriate  Motor:  Normal  Speech/Language:   Normal Rate  Affect:  Depressed and Tearful  Mood:  anxious, depressed, and irritable  Thought process:  normal  Thought content:    WNL  Sensory/Perceptual disturbances:    WNL  Orientation:  oriented to person, place, time/date, and situation  Attention:  Good  Concentration:  Good  Memory:  WNL  Fund of knowledge:   Good  Insight:    Good  Judgment:   Good  Impulse Control:  Good   Reported Symptoms:  Pt scored 6 on PHQ 9.  Pt scored a 9 on GAD 7.  Pt reports very easily irritable and some days of feeling anxious and worries.  Pt reports low self worth, negative self talk, some days depressed mood and loss of interest and fatigue.  Pt reports feels that everyone has left her behind.  Pt denies SI or want to end her life, but reports some days thinks she doesn't want to be here.  Pt reports she is sleeping well- the difficult falling asleep that  presented in past couple months resolved.   Pt reports conflict and not wanting conflict at home.  Pt reports at times doesn't think before she speaks and adds to conflict.   Pt reports she does feel good about sharing her bracelets w/ bible verses with others.  Risk Assessment: Danger to Self:  No Self-injurious Behavior: No Danger to Others: No Duty to Warn:no Physical Aggression / Violence:No  Access to Firearms a concern: No  Gang Involvement:No  Patient / guardian was educated about steps to take if suicide or homicide risk level increases between visits: yes While future psychiatric events cannot be accurately predicted, the patient does not currently require acute inpatient psychiatric care and does not currently meet [place]  involuntary commitment criteria.  Substance Abuse History: Current substance abuse: No     Past Psychiatric History:   Previous psychological history is significant for anxiety and depression Outpatient Providers:Pt had counseling in 2014 for depression.  Pt tx by PCP for anxiety.   History of Psych Hospitalization: No  Psychological Testing: none   Abuse History:  Victim of: No., none   Report needed: No. Victim of Neglect:No. Perpetrator of none  Witness / Exposure to Domestic Violence: No   Protective Services Involvement: No  Witness to [employer] Violence:  No   Family History:  Family History  Problem Relation Age of Onset   Diabetes Mother  Heart disease Mother        HAD TRIPLE BYPASS   Hypertension Mother    Kidney disease Father    Breast cancer Sister    Breast cancer Maternal Grandmother    Cancer Maternal Grandmother        LYMPH NODE CANCER   Cancer Maternal Aunt        COLORECTAL    COPD Brother    Kidney failure Brother    Colon cancer Neg Hx   Pt grew up in [place] w/ her mom, dad and older brother.  Pt also had a sister who died at birth when she was in the first grade.  Pt reports her childhood was good.  Pt  reports her mom died in 06/28/2013 had COPD, pt reports her dad died in 06-29-03 from complications of kidney disease.  Pt reports her brother died in 2018-06-28 at age of 60 from kidney failure    Living situation: the patient lives with their spouse, 3 dogs and 3 miniature donkeys.   Sexual Orientation: Straight  Relationship Status: married 16years. Together 17.  Pt reports past couple years very negative w/ husband.  Pt reports have considered separating.  Pt and husband start marital counseling in 2 weeks.   Name of spouse / other:Joe If a parent, number of children / ages: pt has a 25y/o step daughter.  She was 8y/o when she met her husband.  Pt reports they have positive relationship and are still in contact although stepdaughter is not talking to husband current.   Support Systems: friends  Financial Stress:  Yes   Income/Employment/Disability: Employment.  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  Pt reports she likes her job and has worked there 1.5 years.  Pt worked for [employer] good, demands of job not.  Pt works for [employer] for short time.  Worked 15 years for [employer] of [place] and had hoped to retire w/ but laid off w/ several employees when cuts made.  Husband works for [employer] and working PT for tire center- works 7 days a week.    Military Service: No   Educational History: Education: college graduate  Religion/Sprituality/World View: Sherlean  Attends church but no longer meeting her spiritual needs and may start looking for new church.    Any cultural differences that may affect / interfere with treatment:  not applicable   Recreation/Hobbies: enjoys music, enjoys Baseball- the [employer].    Stressors: Marital or family conflict    Strengths: seeking counseling  Barriers:  none   Legal History: Pending legal issue / charges: The patient has no significant history of legal issues. History of legal issue / charges: none  Medical  History/Surgical History: reviewed Past Medical History:  Diagnosis Date   Allergy    Benign hematuria 12/2009   WORKUP WITH DR. ALEENE WAS NEGATIVE   Depression    Family history of adverse reaction to anesthesia    My dad -  slow awaking   Headache    Mirgraine  - years ago   Heart murmur    HTN (hypertension)    Obesity    Pre-diabetes    Ringing in ears, bilateral    Sleep apnea    UTI (urinary tract infection), uncomplicated     Past Surgical History:  Procedure Laterality Date   CHOLECYSTECTOMY  10/2010   CYSTECTOMY     x 2 from chest   KNEE ARTHROSCOPY Right 12/19/2020  Procedure: RIGHT KNEE ARTHROSCOPY WITH DEBRIDEMENT;  Surgeon: Harden Jerona GAILS, MD;  Location: [facility] OR;  Service: Orthopedics;  Laterality: Right;    Medications: Current Outpatient Medications  Medication Sig Dispense Refill   ALPRAZolam  (XANAX ) 0.5 MG tablet TAKE 1 TABLET BY MOUTH THREE TIMES A DAY AS NEEDED 90 tablet 2   amLODipine  (NORVASC ) 2.5 MG tablet TAKE 1 TABLET BY MOUTH EVERY DAY 15 tablet 0   pantoprazole  (PROTONIX ) 20 MG tablet TAKE 1 TABLET BY MOUTH EVERY DAY 15 tablet 0   telmisartan  (MICARDIS ) 80 MG tablet TAKE 1 TABLET BY MOUTH EVERY DAY 90 tablet 3   tirzepatide  (ZEPBOUND ) 10 MG/0.5ML Pen Inject 10 mg into the skin once a week. 2 mL 3   No current facility-administered medications for this visit.    Allergies  Allergen Reactions   Wellbutrin  [Bupropion ] Swelling    Throat swelling    Sulfonamide Derivatives Other (See Comments)    Unknown per pt, it's been a long time    Diagnoses:  Mild episode of recurrent major depressive disorder (HCC)  GAD (generalized anxiety disorder)  Plan of Care: Pt is a 57y/o married female seeking counseling for irritability, depression and anxiety. Pt reports significant marital stress- conflict and considering separation.  Pt and husband will start marital counseling in 2 weeks.  Pt reports significant losses w/ mom, dad and brother's death.  Pt  reports struggled w/ self esteem her whole life.  Pt did seek counseling in past for depression and currently being tx by her PCP for anxiety.  Pt to continued w/ PCP for medication management.  Pt to f/u w/ biweekly counseling.       Individualized Treatment Plan Strengths: enjoys music, concerts, enjoys giving her bracelets to others, enjoys baseball.  Supports: her friend at work   Goal/Needs for Treatment:  In order of importance to patient 1) cope w/ stressors- reduce irritability  2) improved self worth 3) ---   Client Statement of Needs: It will Help to talk and vent and get all out, help through stressors of marriage and any transitions.  Feel better about self.   Treatment Level:individual counseling  Symptoms:irritability, anxiety, low self worth.    Client Treatment Preferences:biweekly counseling and continue w/ PCP for medication management.    Healthcare consumer's goal for treatment:  Counselor, Damien Herald, [facility] will support the patient's ability to achieve the goals identified. Cognitive Behavioral Therapy, Assertive Communication/Conflict Resolution Training, Relaxation Training, ACT, Humanistic and other evidenced-based practices will be used to promote progress towards healthy functioning.   Healthcare consumer will: Actively participate in therapy, working towards healthy functioning.    *Justification for Continuation/Discontinuation of Goal: R=Revised, O=Ongoing, A=Achieved, D=Discontinued  Goal 1) Increase coping through stressors, reducing irritability and assertive communication AEB pt report and therapist observation.   Baseline date 06/23/24: Progress towards goal 0; How Often - Daily Target Date Goal Was reviewed Status Code Progress towards goal/Likert rating  06/23/25                Goal 2) Increase pt self worth feelings and positive self statements and self care AEB pt report and therapist observation.  Baseline date 06/23/24: Progress towards goal  0; How Often - Daily Target Date Goal Was reviewed Status Code Progress towards goal  06/23/25                This plan has been reviewed and created by the following participants:  This plan will be reviewed at least every 12  months. Date Behavioral Health Clinician Date Guardian/Patient   06/23/24  [facility] Ann [facility] 06/23/24 Verbal Consent Provided and electronic signature captured.                    Ann Walter, LCMHC

## 2024-06-28 ENCOUNTER — Encounter: Payer: Self-pay | Admitting: Family Medicine

## 2024-06-28 MED ORDER — ZEPBOUND 12.5 MG/0.5ML ~~LOC~~ SOAJ: 12.5000 mg | SUBCUTANEOUS | 1 refills | Status: DC

## 2024-07-12 ENCOUNTER — Other Ambulatory Visit: Payer: Self-pay | Admitting: Internal Medicine

## 2024-07-13 ENCOUNTER — Ambulatory Visit: Admitting: Psychology

## 2024-07-13 DIAGNOSIS — F330 Major depressive disorder, recurrent, mild: Secondary | ICD-10-CM

## 2024-07-13 DIAGNOSIS — F411 Generalized anxiety disorder: Secondary | ICD-10-CM

## 2024-07-13 NOTE — Progress Notes (Signed)
 [facility] Counselor/Therapist Progress Note  Patient ID: KENNEISHA COCHRANE, MRN: [id number],    Date: 07/13/2024  Time Spent: 12:04pm- 12:48pm  Pt is seen for a virtual video visit via caregility.  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  Pt consents to virtual visit and is aware of limitations of such visits.     Treatment Type: Individual Therapy  Reported Symptoms: pt reports some irritability, but feeling more settled.  Pt reports low self worth.    Mental Status Exam: Appearance:  Well Groomed     Behavior: Appropriate  Motor: Normal  Speech/Language:  Clear and Coherent and Normal Rate  Affect: Appropriate  Mood: irritable  Thought process: normal  Thought content:   WNL  Sensory/Perceptual disturbances:   WNL  Orientation: oriented to person, place, time/date, and situation  Attention: Good  Concentration: Good  Memory: WNL  Fund of knowledge:  Good  Insight:   Good  Judgment:  Good  Impulse Control: Good   Risk Assessment: Danger to Self:  No Self-injurious Behavior: No Danger to Others: No Duty to Warn:no Physical Aggression / Violence:No  Access to Firearms a concern: No  Gang Involvement:No   Subjective: Counselor assessed pt current functioning per pt report.  Processed w/pt emotions and stressors.  Explored w/ pt interactions w/ her husband and assertive communication.  Discussed pt self care and planning for weekly to assist in stress management.  Pt affect wnl.  Pt reports since last session feels more settled overall. Pt reports still some irritability and upset by husband comments.  Pt reports has been able to manage w/out escalating.  Pt reports that started couple counseling and scheduled for f/u next week.  Pt reports still questions his true intentions.  Pt reports that she has been enjoying things w/ friends, listening to her music. Pt reports positive for self.  Pt reports plans w/ husband for concert  this weekend.  Pt recognizes negative self talk.  Pt increasing aware of and working ot reframe.    Interventions: Cognitive Behavioral Therapy and self care strategies  Diagnosis:Mild episode of recurrent major depressive disorder (HCC)  GAD (generalized anxiety disorder)  Plan: pt to f/u in 3 weeks for counseling.  Pt to continue w/ PCP for medication management.  Pt to continue w/ couples counseling.     Individualized Treatment Plan Strengths: enjoys music, concerts, enjoys giving her bracelets to others, enjoys baseball.  Supports: her friend at work   Goal/Needs for Treatment:  In order of importance to patient 1) cope w/ stressors- reduce irritability  2) improved self worth 3) ---   Client Statement of Needs: It will Help to talk and vent and get all out, help through stressors of marriage and any transitions.  Feel better about self.   Treatment Level:individual counseling  Symptoms:irritability, anxiety, low self worth.    Client Treatment Preferences:biweekly counseling and continue w/ PCP for medication management.    Healthcare consumer's goal for treatment:  Counselor, Damien Herald, [facility] will support the patient's ability to achieve the goals identified. Cognitive Behavioral Therapy, Assertive Communication/Conflict Resolution Training, Relaxation Training, ACT, Humanistic and other evidenced-based practices will be used to promote progress towards healthy functioning.   Healthcare consumer will: Actively participate in therapy, working towards healthy functioning.    *Justification for Continuation/Discontinuation of Goal: R=Revised, O=Ongoing, A=Achieved, D=Discontinued  Goal 1) Increase coping through stressors, reducing irritability and assertive communication AEB pt report and therapist observation.   Baseline  date 06/23/24: Progress towards goal 0; How Often - Daily Target Date Goal Was reviewed Status Code Progress towards goal/Likert rating  06/23/25                 Goal 2) Increase pt self worth feelings and positive self statements and self care AEB pt report and therapist observation.  Baseline date 06/23/24: Progress towards goal 0; How Often - Daily Target Date Goal Was reviewed Status Code Progress towards goal  06/23/25                This plan has been reviewed and created by the following participants:  This plan will be reviewed at least every 12 months. Date Behavioral Health Clinician Date Guardian/Patient   06/23/24  [facility] Barbarann [facility] 06/23/24 Verbal Consent Provided and electronic signature captured.                    Malaki Koury, [facility]    [place], LCMHC

## 2024-07-13 NOTE — Progress Notes (Signed)
   Ann Walter, [facility]

## 2024-07-19 ENCOUNTER — Encounter: Payer: Self-pay | Admitting: Family Medicine

## 2024-07-24 ENCOUNTER — Other Ambulatory Visit: Payer: Self-pay | Admitting: Family Medicine

## 2024-07-24 DIAGNOSIS — F411 Generalized anxiety disorder: Secondary | ICD-10-CM

## 2024-07-31 ENCOUNTER — Other Ambulatory Visit: Payer: Self-pay | Admitting: Internal Medicine

## 2024-08-01 ENCOUNTER — Ambulatory Visit: Admitting: Psychology

## 2024-08-08 ENCOUNTER — Encounter: Payer: Self-pay | Admitting: Family Medicine

## 2024-08-18 ENCOUNTER — Other Ambulatory Visit: Payer: Self-pay | Admitting: Internal Medicine

## 2024-08-26 ENCOUNTER — Other Ambulatory Visit: Payer: Self-pay | Admitting: Family Medicine

## 2024-08-26 DIAGNOSIS — F411 Generalized anxiety disorder: Secondary | ICD-10-CM

## 2024-08-31 ENCOUNTER — Ambulatory Visit: Admitting: Primary Care

## 2024-08-31 VITALS — BP 128/72 | HR 70 | Temp 97.6°F | Ht 63.5 in | Wt 226.6 lb

## 2024-08-31 DIAGNOSIS — G4733 Obstructive sleep apnea (adult) (pediatric): Secondary | ICD-10-CM

## 2024-08-31 NOTE — Patient Instructions (Addendum)
  VISIT SUMMARY: Today, you came in for a follow-up appointment regarding your CPAP therapy for sleep apnea. You have been using your CPAP machine consistently and effectively, with good control of your sleep apnea. We discussed your current settings, dry mouth issues, and your recent weight loss.  YOUR PLAN: -OBSTRUCTIVE SLEEP APNEA: Obstructive sleep apnea is a condition where your airway becomes blocked during sleep, causing breathing pauses. Your CPAP therapy is working well, with an apnea-hypopnea index (AHI) of 1.2, indicating effective management. We increased your humidification setting from level 4 to level 5 to help with your dry mouth. Your CPAP supplies have been renewed for the next year through [employer]. We will consider a repeat sleep study if you achieve significant weight loss to see if you still need CPAP therapy.  INSTRUCTIONS: Please follow up in one year unless you experience any issues. Continue using your CPAP machine as directed and monitor your dry mouth symptoms with the increased humidification setting.  Orders: Renew CPAP supplies with [employer] x 1 year Increase humidification to level 5   Follow-up 1 year with Beth NP or sooner if needed

## 2024-08-31 NOTE — Progress Notes (Signed)
 @Patient  ID: Ann Walter, female    DOB: 04/03/1966, 58 y.o.   MRN: [id number]  No chief complaint on file.   Referring provider: Watt Harlene BROCKS, MD  HPI: 58 year old female, never smoked tobacco (she smoked marijuana on and off her whole life). PMH significant for HTN, OSA, GAD, UACS, pre-diabetes, morbid obesity.   Previous [facility] pulmonary encounter:  02/03/2022 Patient presents today for sleep consult.  She has hx sleep apnea. She had a split-night sleep study in 2019 that showed severe sleep apnea, AHI 59/hr. Optimal pressure was 12cm h20 with small FFM with heated humidity. She is currently wearing CPAP and needs new supplies.  She is unsure of her DME company, previously [employer] which is now [employer]. Her typical bedtime is between 8:30-9:30pm. It does not take her long to fall asleep. She normally sleeps well through the night, waking up only 1-2 times to use restroom. Starts her day between 5:30-6:30am. She has lost 15 lbs in the last 2 years. She does not wear oxygen. No significant daytime sleepiness. Denies narcolepsy, cataplexy, sleep walking.  Epworth 11.   OSA on CPAP - Split night 06/03/2018 >> severe OSA, AHI 59/hr. Optimal pressure 12cm h20. Patient reports compliance with CPAP and benefit from use. [employer] is sending compliance report, we will place an order to have CPAP supplies renewed. Continue to encourage patient wear CPAP every night. Advised against driving if experiencing excessive daytime sleepinesss. Continue to work on weight loss efforts. FU in 6 months or sooner if needed.   03/10/2022 Patient present for follow-up OSA/review spirometry testing. Patient expressed concern to her PCP that she may have COPD d/t frequent bronchitis symptoms. She has never smoked before. No acute respiratory symptoms today. She tells me that this is the second or third year that she has had bronchitis. This typically occurs during pollen season. She does not take anything over the counter.  No shortness of breath except when walking long distances. She is working on morgan stanley loss. Spirometry today was normal with normal diffusion capacity. She had minimal curvature to FVL suggestive some mild small airway disease. She had a normal echocardiogram in June 2015. She reports compliance with CPAP, we have called [employer] for download request.    08/11/2022- Interim hx Patient presents today for 6 month follow-up/ OSA and chronic bronchitis. She is sleeping well at night. She is  100% compliant with CPAP use over the last 30 days. Current pressure 12cm h20. She will occasionally take a 20 min nap. No significant daytime sleepiness. Epworth 10. She is working on weight loss, on sexenda. No coughing or shortness of breath symptoms.   Airview download 07/09/22- 08/07/22 Usage 30/30 days; 100% > 4 hours  Average usage 7 hours 52 mins Pressure 12 cm h20 Airleaks 25.5L/min (95%) AHI 1.4    08/31/2024- Interim hx  Discussed the use of AI scribe software for clinical note transcription with the patient, who gave verbal consent to proceed.  History of Present Illness Ann Walter is a 58 year old female with sleep apnea who presents for follow-up regarding CPAP therapy.  She has been using her CPAP machine consistently, with a usage rate of 87% and an average of 6 hours and 12 minutes per night. The current pressure setting is 12, and there are no air leaks. Her apnea score is 1.2.  She notes a change in water usage in her CPAP machine's humidifier since losing weight, with only half the water being  used overnight compared to before. She experiences frequent dry mouth, which she manages by taking a sip of water during the night. She has not adjusted the humidification settings herself.  She uses a small mask that covers her nose and mouth, and mentions that her small mouth causes discomfort during dental visits.  She has been losing weight and is currently on a medication with a dosage of  12.5, although she missed a dose on Monday due to a delay in prior authorization for a refill. She is frustrated with the authorization process but notes that she is doing well with her weight loss.  Frequent dry mouth.  Airview download 07/31/24-08/29/24 Usage 26/30 days (87%); 22 days (73%) > 4 hours Average usage 6 hours 12 mins Pressure 12cm h20 Airleaks 5.1L/min (95%) AHI 1.2   Allergies  Allergen Reactions   Wellbutrin  [Bupropion ] Swelling    Throat swelling    Sulfonamide Derivatives Other (See Comments)    Unknown per pt, it's been a long time    Immunization History  Administered Date(s) Administered   Influenza Split 07/08/2012   Influenza, Seasonal, Injecte, Preservative Fre 08/13/2023   Influenza,inj,Quad PF,6+ Mos 11/27/2014, 07/03/2015, 07/09/2016, 06/24/2017, 07/07/2018, 09/01/2019, 08/09/2020, 11/25/2021, 07/07/2022   Tdap 05/16/2014   Zoster Recombinant(Shingrix ) 04/14/2022, 07/07/2022    Past Medical History:  Diagnosis Date   Allergy    Benign hematuria 12/2009   WORKUP WITH DR. ALEENE WAS NEGATIVE   Depression    Family history of adverse reaction to anesthesia    My dad -  slow awaking   Headache    Mirgraine  - years ago   Heart murmur    HTN (hypertension)    Obesity    Pre-diabetes    Ringing in ears, bilateral    Sleep apnea    UTI (urinary tract infection), uncomplicated     Tobacco History: Social History   Tobacco Use  Smoking Status Never  Smokeless Tobacco Never  Tobacco Comments   Smokes marijuana every once in a while.  03/10/22  hfb   Counseling given: Not Answered Tobacco comments: Smokes marijuana every once in a while.  03/10/22  hfb   Outpatient Medications Prior to Visit  Medication Sig Dispense Refill   ALPRAZolam  (XANAX ) 0.5 MG tablet TAKE 1 TABLET BY MOUTH THREE TIMES A DAY AS NEEDED 90 tablet 0   amLODipine  (NORVASC ) 2.5 MG tablet TAKE 1 TABLET BY MOUTH EVERY DAY 15 tablet 0   pantoprazole  (PROTONIX ) 20 MG tablet  TAKE 1 TABLET BY MOUTH EVERY DAY 7 tablet 0   telmisartan  (MICARDIS ) 80 MG tablet TAKE 1 TABLET BY MOUTH EVERY DAY 90 tablet 3   tirzepatide  (ZEPBOUND ) 12.5 MG/0.5ML Pen Inject 12.5 mg into the skin once a week. 6 mL 1   No facility-administered medications prior to visit.      Review of Systems  Review of Systems  Constitutional: Negative.   HENT: Negative.    Respiratory: Negative.    Cardiovascular: Negative.      Physical Exam  LMP  (LMP Unknown) Comment: pt states she is not have periods Physical Exam Constitutional:      Appearance: Normal appearance. She is well-developed.  HENT:     Head: Normocephalic and atraumatic.     Mouth/Throat:     Mouth: Mucous membranes are moist.     Pharynx: Oropharynx is clear.  Eyes:     Pupils: Pupils are equal, round, and reactive to light.  Cardiovascular:     Rate and  Rhythm: Normal rate and regular rhythm.     Heart sounds: Normal heart sounds. No murmur heard. Pulmonary:     Effort: Pulmonary effort is normal. No respiratory distress.     Breath sounds: Normal breath sounds. No wheezing or rhonchi.  Musculoskeletal:        General: Normal range of motion.     Cervical back: Normal range of motion and neck supple.  Skin:    General: Skin is warm and dry.     Findings: No erythema or rash.  Neurological:     General: No focal deficit present.     Mental Status: She is alert and oriented to person, place, and time. Mental status is at baseline.  Psychiatric:        Mood and Affect: Mood normal.        Behavior: Behavior normal.        Thought Content: Thought content normal.        Judgment: Judgment normal.       Lab Results:  CBC    Component Value Date/Time   WBC 6.4 11/11/2023 0852   RBC 4.27 11/11/2023 0852   HGB 12.6 11/11/2023 0852   HCT 38.3 11/11/2023 0852   PLT 253.0 11/11/2023 0852   MCV 89.5 11/11/2023 0852   MCV 95.8 04/13/2013 1011   MCH 29.3 05/21/2023 1554   MCHC 33.0 11/11/2023 0852   RDW  14.1 11/11/2023 0852   LYMPHSABS 2.9 05/21/2023 1554   MONOABS 0.7 05/21/2023 1554   EOSABS 0.4 05/21/2023 1554   BASOSABS 0.0 05/21/2023 1554    BMET    Component Value Date/Time   NA 142 11/11/2023 0852   K 4.6 11/11/2023 0852   CL 105 11/11/2023 0852   CO2 30 11/11/2023 0852   GLUCOSE 88 11/11/2023 0852   BUN 17 11/11/2023 0852   CREATININE 0.88 11/11/2023 0852   CREATININE 0.74 08/09/2020 1138   CALCIUM 9.5 11/11/2023 0852   GFRNONAA 60 (L) 05/21/2023 1554   GFRAA >60 03/25/2018 1636    BNP No results found for: BNP  ProBNP No results found for: PROBNP  Imaging: No results found.   Assessment & Plan:   1. OSA on CPAP (Primary)  Assessment and Plan Assessment & Plan Obstructive sleep apnea Well-controlled with current CPAP settings. Apnea-hypopnea index (AHI) is 1.2, indicating effective management. No significant air leaks, with current air leak at 5 L/min at the 95th percentile, improved from previous 25 L/min. Reports dry mouth, likely due to current humidification settings. No issues with CPAP pressure despite weight loss. Current pressure setting is 12 cm H2O, effective in controlling apnea. - Increased humidification setting from level 4 to level 5 to address dry mouth. - Renewed CPAP supplies for the next year through [employer]. - Scheduled follow-up in one year unless issues arise. - Continue weight loss efforts with Zepbound  12.5mg  [place]/ weekly  - Will consider repeat sleep study if significant weight loss is achieved to assess need for continued CPAP use.   Ann LELON Ferrari, NP 08/31/2024

## 2024-09-04 NOTE — Progress Notes (Unsigned)
 Late cancellation.

## 2024-09-04 NOTE — Patient Instructions (Signed)
 It was great to see you today, I will be in touch with your labs Assuming all is well please see me in about 6 months

## 2024-09-05 ENCOUNTER — Telehealth: Payer: Self-pay

## 2024-09-05 ENCOUNTER — Other Ambulatory Visit (HOSPITAL_COMMUNITY): Payer: Self-pay

## 2024-09-05 NOTE — Telephone Encounter (Signed)
 Pharmacy Patient Advocate Encounter  Received notification from [employer] that Prior Authorization for Zepbound  12.5mg /0.35ml has been APPROVED from 09/05/24 to 09/05/25   PA #/Case ID/Reference #: [id number]

## 2024-09-05 NOTE — Telephone Encounter (Signed)
 Pharmacy Patient Advocate Encounter   Received notification from Patient Advice Request messages that prior authorization for Zepbound  12.5mg /0.31ml is required/requested.   Insurance verification completed.   The patient is insured through [facility].   Per test claim: PA required; PA submitted to above mentioned insurance via Latent Key/confirmation #/EOC [id number] Status is pending

## 2024-09-07 ENCOUNTER — Ambulatory Visit (INDEPENDENT_AMBULATORY_CARE_PROVIDER_SITE_OTHER): Admitting: Family Medicine

## 2024-09-07 DIAGNOSIS — R454 Irritability and anger: Secondary | ICD-10-CM

## 2024-09-07 DIAGNOSIS — Z0000 Encounter for general adult medical examination without abnormal findings: Secondary | ICD-10-CM

## 2024-09-07 DIAGNOSIS — F411 Generalized anxiety disorder: Secondary | ICD-10-CM

## 2024-09-07 DIAGNOSIS — Z1329 Encounter for screening for other suspected endocrine disorder: Secondary | ICD-10-CM

## 2024-09-07 DIAGNOSIS — R7303 Prediabetes: Secondary | ICD-10-CM

## 2024-09-07 DIAGNOSIS — I10 Essential (primary) hypertension: Secondary | ICD-10-CM

## 2024-09-07 DIAGNOSIS — Z91199 Patient's noncompliance with other medical treatment and regimen due to unspecified reason: Secondary | ICD-10-CM

## 2024-09-07 DIAGNOSIS — Z13220 Encounter for screening for lipoid disorders: Secondary | ICD-10-CM

## 2024-09-19 ENCOUNTER — Encounter: Payer: Self-pay | Admitting: Family Medicine

## 2024-09-26 ENCOUNTER — Other Ambulatory Visit: Payer: Self-pay | Admitting: Family Medicine

## 2024-09-26 DIAGNOSIS — F411 Generalized anxiety disorder: Secondary | ICD-10-CM

## 2024-09-27 NOTE — Progress Notes (Unsigned)
 [facility] [address] [place], [place] [address] [phone]  Date:  09/29/2024   Name:  Ann Walter   DOB:  Nov 23, 1965   MRN:  [id number]  PCP:  Watt Harlene BROCKS, MD    Chief Complaint: No chief complaint on file.   History of Present Illness:  Ann Walter is a 58 y.o. very pleasant female patient who presents with the following:  Patient seen today for physical exam.  I saw her most recently in April History of hypertension, sleep apnea, prediabetes, depression, obesity   She was seen by pulmonology to discuss her sleep apnea in November-she is overall doing well with her CPAP use  She has been losing weight with tirzepatide   Due for tetanus booster Recommend flu shot Can give pneumonia vaccine Colon cancer screening is up to date Mammogram, Pap smear up-to-date She has completed Shingrix  Routine labs done in January, can update  Telmisartan  80 Amlodipine  2.5 Pantoprazole  Tirzepatide    Discussed the use of AI scribe software for clinical note transcription with the patient, who gave verbal consent to proceed.  History of Present Illness    Patient Active Problem List   Diagnosis Date Noted   OSA on CPAP 02/03/2022   Left shoulder pain 12/22/2017   Upper airway cough syndrome 09/14/2017   Pre-diabetes 11/22/2015   Morbid obesity due to excess calories (HCC) 11/21/2015   Ovarian cyst 02/28/2015   Chest pain, atypical 03/08/2014   Heart palpitations 03/08/2014   GAD (generalized anxiety disorder) 02/13/2014   Depression    Essential hypertension     Past Medical History:  Diagnosis Date   Allergy    Benign hematuria 12/2009   WORKUP WITH DR. ALEENE WAS NEGATIVE   Depression    Family history of adverse reaction to anesthesia    My dad -  slow awaking   Headache    Mirgraine  - years ago   Heart murmur    HTN (hypertension)    Obesity    Pre-diabetes    Ringing in ears,  bilateral    Sleep apnea    UTI (urinary tract infection), uncomplicated     Past Surgical History:  Procedure Laterality Date   CHOLECYSTECTOMY  10/2010   CYSTECTOMY     x 2 from chest   KNEE ARTHROSCOPY Right 12/19/2020   Procedure: RIGHT KNEE ARTHROSCOPY WITH DEBRIDEMENT;  Surgeon: Harden Jerona GAILS, MD;  Location: [facility] OR;  Service: Orthopedics;  Laterality: Right;    Social History   Tobacco Use   Smoking status: Never   Smokeless tobacco: Never   Tobacco comments:    Smokes a marijuana pen every once in a while . ATB 08-31-24    Smokes marijuana every once in a while.  03/10/22  hfb  Vaping Use   Vaping status: Never Used  Substance Use Topics   Alcohol use: Not Currently    Alcohol/week: 0.0 standard drinks of alcohol    Comment: occasionally   Drug use: No    Family History  Problem Relation Age of Onset   Diabetes Mother    Heart disease Mother        HAD TRIPLE BYPASS   Hypertension Mother    Kidney disease Father    Breast cancer Sister    Breast cancer Maternal Grandmother    Cancer Maternal Grandmother        LYMPH NODE CANCER   Cancer Maternal Aunt  COLORECTAL    COPD Brother    Kidney failure Brother    Colon cancer Neg Hx     Allergies  Allergen Reactions   Wellbutrin  [Bupropion ] Swelling    Throat swelling    Sulfonamide Derivatives Other (See Comments)    Unknown per pt, it's been a long time    Medication list has been reviewed and updated.  Current Outpatient Medications on File Prior to Visit  Medication Sig Dispense Refill   ALPRAZolam  (XANAX ) 0.5 MG tablet TAKE 1 TABLET BY MOUTH THREE TIMES A DAY AS NEEDED 90 tablet 0   amLODipine  (NORVASC ) 2.5 MG tablet TAKE 1 TABLET BY MOUTH EVERY DAY 15 tablet 0   pantoprazole  (PROTONIX ) 20 MG tablet TAKE 1 TABLET BY MOUTH EVERY DAY 7 tablet 0   telmisartan  (MICARDIS ) 80 MG tablet TAKE 1 TABLET BY MOUTH EVERY DAY 90 tablet 3   tirzepatide  (ZEPBOUND ) 12.5 MG/0.5ML Pen Inject 12.5 mg into the  skin once a week. 6 mL 1   No current facility-administered medications on file prior to visit.    Review of Systems:  As per HPI- otherwise negative.   Physical Examination: There were no vitals filed for this visit. There were no vitals filed for this visit. There is no height or weight on file to calculate BMI. Ideal Body Weight:    GEN: no acute distress. HEENT: Atraumatic, Normocephalic.  Ears and Nose: No external deformity. CV: RRR, No M/G/R. No JVD. No thrill. No extra heart sounds. PULM: CTA B, no wheezes, crackles, rhonchi. No retractions. No resp. distress. No accessory muscle use. ABD: S, NT, ND, +BS. No rebound. No HSM. EXTR: No c/c/e PSYCH: Normally interactive. Conversant.    Assessment and Plan: No diagnosis found.  Assessment & Plan   Signed Harlene Schroeder, MD

## 2024-09-27 NOTE — Patient Instructions (Signed)
 It was good to see you again today-I will be in touch with your labs

## 2024-09-29 ENCOUNTER — Encounter: Payer: Self-pay | Admitting: Family Medicine

## 2024-09-29 ENCOUNTER — Other Ambulatory Visit (HOSPITAL_COMMUNITY): Payer: Self-pay

## 2024-09-29 ENCOUNTER — Telehealth: Payer: Self-pay

## 2024-09-29 ENCOUNTER — Ambulatory Visit: Admitting: Family Medicine

## 2024-09-29 VITALS — BP 118/74 | HR 70 | Temp 97.9°F | Ht 63.5 in | Wt 213.0 lb

## 2024-09-29 DIAGNOSIS — Z23 Encounter for immunization: Secondary | ICD-10-CM

## 2024-09-29 DIAGNOSIS — Z13220 Encounter for screening for lipoid disorders: Secondary | ICD-10-CM

## 2024-09-29 DIAGNOSIS — Z1329 Encounter for screening for other suspected endocrine disorder: Secondary | ICD-10-CM

## 2024-09-29 DIAGNOSIS — I10 Essential (primary) hypertension: Secondary | ICD-10-CM

## 2024-09-29 DIAGNOSIS — R7303 Prediabetes: Secondary | ICD-10-CM

## 2024-09-29 DIAGNOSIS — Z0000 Encounter for general adult medical examination without abnormal findings: Secondary | ICD-10-CM

## 2024-09-29 LAB — COMPREHENSIVE METABOLIC PANEL WITH GFR
ALT: 10 U/L (ref 0–35)
AST: 16 U/L (ref 0–37)
Albumin: 4.5 g/dL (ref 3.5–5.2)
Alkaline Phosphatase: 87 U/L (ref 39–117)
BUN: 15 mg/dL (ref 6–23)
CO2: 28 meq/L (ref 19–32)
Calcium: 9.7 mg/dL (ref 8.4–10.5)
Chloride: 103 meq/L (ref 96–112)
Creatinine, Ser: 0.85 mg/dL (ref 0.40–1.20)
GFR: 75.24 mL/min (ref >60.00)
Glucose, Bld: 85 mg/dL (ref 70–99)
Potassium: 4.8 meq/L (ref 3.5–5.1)
Sodium: 140 meq/L (ref 135–145)
Total Bilirubin: 1.2 mg/dL (ref 0.2–1.2)
Total Protein: 6.7 g/dL (ref 6.0–8.3)

## 2024-09-29 LAB — CBC
HCT: 40 % (ref 36.0–46.0)
Hemoglobin: 13.3 g/dL (ref 12.0–15.0)
MCHC: 33.1 g/dL (ref 30.0–36.0)
MCV: 88 fl (ref 78.0–100.0)
Platelets: 201 K/uL (ref 150.0–400.0)
RBC: 4.55 Mil/uL (ref 3.87–5.11)
RDW: 15.4 % (ref 11.5–15.5)
WBC: 5.4 K/uL (ref 4.0–10.5)

## 2024-09-29 LAB — LIPID PANEL
Cholesterol: 153 mg/dL (ref 0–200)
HDL: 46 mg/dL (ref >39.00)
LDL Cholesterol: 91 mg/dL (ref 0–99)
NonHDL: 106.82
Total CHOL/HDL Ratio: 3
Triglycerides: 81 mg/dL (ref 0.0–149.0)
VLDL: 16.2 mg/dL (ref 0.0–40.0)

## 2024-09-29 LAB — TSH: TSH: 1.32 u[IU]/mL (ref 0.35–5.50)

## 2024-09-29 LAB — HEMOGLOBIN A1C: Hgb A1c MFr Bld: 5.6 % (ref 4.6–6.5)

## 2024-09-29 MED ORDER — TIRZEPATIDE 15 MG/0.5ML ~~LOC~~ SOAJ: 15.0000 mg | SUBCUTANEOUS | 1 refills | Status: DC

## 2024-09-29 MED ORDER — ZEPBOUND 15 MG/0.5ML ~~LOC~~ SOAJ: 15.0000 mg | SUBCUTANEOUS | 1 refills | Status: AC

## 2024-09-29 NOTE — Telephone Encounter (Signed)
 Pharmacy Patient Advocate Encounter   Received notification from [employer] that prior authorization for Mounjaro  15mg /0.53ml is required/requested.   Insurance verification completed.   The patient is insured through [facility].   Patient has a prior authorization approval on file for Zepbound  that is good until 08/2025.   Can you please send in an order for Zepbound  to the pharmacy?

## 2024-09-29 NOTE — Addendum Note (Signed)
 Addended by: WATT RAISIN C on: 09/29/2024 04:59 PM   Modules accepted: Orders

## 2024-10-27 ENCOUNTER — Other Ambulatory Visit: Payer: Self-pay | Admitting: Family Medicine

## 2024-10-27 DIAGNOSIS — F411 Generalized anxiety disorder: Secondary | ICD-10-CM

## 2024-11-08 ENCOUNTER — Encounter: Payer: Self-pay | Admitting: Family Medicine

## 2024-11-08 MED ORDER — ONDANSETRON 8 MG PO TBDP: 8.0000 mg | ORAL_TABLET | Freq: Three times a day (TID) | ORAL | 1 refills | Status: AC | PRN
